# Patient Record
Sex: Female | Born: 1955 | Race: White | Hispanic: No | Marital: Single | Smoking: Never smoker
Health system: Southern US, Community
[De-identification: ages and names within clinical notes are randomized; demographics above are authoritative.]

## PROBLEM LIST (undated history)

## (undated) DIAGNOSIS — D689 Coagulation defect, unspecified: Secondary | ICD-10-CM

## (undated) DIAGNOSIS — R112 Nausea with vomiting, unspecified: Secondary | ICD-10-CM

## (undated) DIAGNOSIS — N200 Calculus of kidney: Secondary | ICD-10-CM

## (undated) DIAGNOSIS — I10 Essential (primary) hypertension: Secondary | ICD-10-CM

## (undated) DIAGNOSIS — Z86718 Personal history of other venous thrombosis and embolism: Secondary | ICD-10-CM

## (undated) DIAGNOSIS — T4145XA Adverse effect of unspecified anesthetic, initial encounter: Secondary | ICD-10-CM

## (undated) DIAGNOSIS — M1990 Unspecified osteoarthritis, unspecified site: Secondary | ICD-10-CM

## (undated) DIAGNOSIS — K5790 Diverticulosis of intestine, part unspecified, without perforation or abscess without bleeding: Secondary | ICD-10-CM

## (undated) DIAGNOSIS — E785 Hyperlipidemia, unspecified: Secondary | ICD-10-CM

## (undated) DIAGNOSIS — Z87442 Personal history of urinary calculi: Secondary | ICD-10-CM

## (undated) DIAGNOSIS — R7303 Prediabetes: Secondary | ICD-10-CM

## (undated) DIAGNOSIS — T7840XA Allergy, unspecified, initial encounter: Secondary | ICD-10-CM

## (undated) DIAGNOSIS — Z9889 Other specified postprocedural states: Secondary | ICD-10-CM

## (undated) HISTORY — PX: JOINT REPLACEMENT: SHX530

## (undated) HISTORY — PX: COLONOSCOPY: SHX174

## (undated) HISTORY — DX: Coagulation defect, unspecified: D68.9

## (undated) HISTORY — DX: Hyperlipidemia, unspecified: E78.5

## (undated) HISTORY — DX: Allergy, unspecified, initial encounter: T78.40XA

## (undated) HISTORY — DX: Calculus of kidney: N20.0

## (undated) HISTORY — DX: Diverticulosis of intestine, part unspecified, without perforation or abscess without bleeding: K57.90

## (undated) HISTORY — DX: Unspecified osteoarthritis, unspecified site: M19.90

## (undated) HISTORY — DX: Essential (primary) hypertension: I10

## (undated) HISTORY — PX: ROTATOR CUFF REPAIR: SHX139

---
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
## 1994-11-14 DIAGNOSIS — Z86718 Personal history of other venous thrombosis and embolism: Secondary | ICD-10-CM

## 1994-11-14 HISTORY — DX: Personal history of other venous thrombosis and embolism: Z86.718

## 2001-05-15 ENCOUNTER — Ambulatory Visit (HOSPITAL_COMMUNITY): Admission: RE | Admit: 2001-05-15 | Discharge: 2001-05-15 | Payer: Self-pay | Admitting: Family Medicine

## 2003-12-17 ENCOUNTER — Emergency Department (HOSPITAL_COMMUNITY): Admission: EM | Admit: 2003-12-17 | Discharge: 2003-12-17 | Payer: Self-pay | Admitting: Family Medicine

## 2004-01-16 ENCOUNTER — Encounter: Admission: RE | Admit: 2004-01-16 | Discharge: 2004-01-16 | Payer: Self-pay | Admitting: Family Medicine

## 2004-02-03 ENCOUNTER — Encounter: Admission: RE | Admit: 2004-02-03 | Discharge: 2004-02-03 | Payer: Self-pay | Admitting: Sports Medicine

## 2004-03-08 ENCOUNTER — Encounter: Admission: RE | Admit: 2004-03-08 | Discharge: 2004-03-08 | Payer: Self-pay | Admitting: Family Medicine

## 2004-04-05 ENCOUNTER — Encounter: Admission: RE | Admit: 2004-04-05 | Discharge: 2004-04-05 | Payer: Self-pay | Admitting: Family Medicine

## 2004-05-24 ENCOUNTER — Encounter: Admission: RE | Admit: 2004-05-24 | Discharge: 2004-05-24 | Payer: Self-pay | Admitting: Sports Medicine

## 2004-06-21 ENCOUNTER — Encounter: Admission: RE | Admit: 2004-06-21 | Discharge: 2004-06-21 | Payer: Self-pay | Admitting: Family Medicine

## 2004-07-26 ENCOUNTER — Ambulatory Visit: Payer: Self-pay | Admitting: Family Medicine

## 2004-08-09 ENCOUNTER — Ambulatory Visit: Payer: Self-pay | Admitting: Family Medicine

## 2004-09-13 ENCOUNTER — Ambulatory Visit: Payer: Self-pay | Admitting: Sports Medicine

## 2004-10-25 ENCOUNTER — Ambulatory Visit: Payer: Self-pay | Admitting: Family Medicine

## 2004-11-22 ENCOUNTER — Ambulatory Visit: Payer: Self-pay | Admitting: Family Medicine

## 2004-11-29 ENCOUNTER — Ambulatory Visit: Payer: Self-pay | Admitting: Family Medicine

## 2004-12-06 ENCOUNTER — Ambulatory Visit: Payer: Self-pay | Admitting: Family Medicine

## 2004-12-20 ENCOUNTER — Ambulatory Visit: Payer: Self-pay | Admitting: Family Medicine

## 2005-01-03 ENCOUNTER — Ambulatory Visit: Payer: Self-pay | Admitting: Family Medicine

## 2005-01-17 ENCOUNTER — Ambulatory Visit: Payer: Self-pay | Admitting: Sports Medicine

## 2005-02-28 ENCOUNTER — Ambulatory Visit: Payer: Self-pay | Admitting: Sports Medicine

## 2005-03-14 ENCOUNTER — Ambulatory Visit: Payer: Self-pay | Admitting: Sports Medicine

## 2005-03-28 ENCOUNTER — Ambulatory Visit: Payer: Self-pay | Admitting: Family Medicine

## 2005-04-18 ENCOUNTER — Ambulatory Visit: Payer: Self-pay | Admitting: Family Medicine

## 2005-05-02 ENCOUNTER — Ambulatory Visit: Payer: Self-pay | Admitting: Family Medicine

## 2005-05-16 ENCOUNTER — Ambulatory Visit: Payer: Self-pay | Admitting: Family Medicine

## 2005-05-30 ENCOUNTER — Ambulatory Visit: Payer: Self-pay | Admitting: Sports Medicine

## 2005-06-13 ENCOUNTER — Ambulatory Visit: Payer: Self-pay | Admitting: Family Medicine

## 2005-08-01 ENCOUNTER — Ambulatory Visit: Payer: Self-pay | Admitting: Sports Medicine

## 2005-08-22 ENCOUNTER — Ambulatory Visit: Payer: Self-pay | Admitting: Family Medicine

## 2005-08-29 ENCOUNTER — Ambulatory Visit: Payer: Self-pay | Admitting: Family Medicine

## 2005-09-12 ENCOUNTER — Ambulatory Visit: Payer: Self-pay | Admitting: Sports Medicine

## 2005-09-26 ENCOUNTER — Encounter: Admission: RE | Admit: 2005-09-26 | Discharge: 2005-09-26 | Payer: Self-pay | Admitting: Sports Medicine

## 2005-10-10 ENCOUNTER — Ambulatory Visit: Payer: Self-pay | Admitting: Family Medicine

## 2005-11-09 ENCOUNTER — Ambulatory Visit: Payer: Self-pay | Admitting: Family Medicine

## 2005-12-12 ENCOUNTER — Ambulatory Visit: Payer: Self-pay | Admitting: Sports Medicine

## 2006-01-09 ENCOUNTER — Ambulatory Visit: Payer: Self-pay | Admitting: Family Medicine

## 2006-01-23 ENCOUNTER — Ambulatory Visit: Payer: Self-pay | Admitting: Family Medicine

## 2006-02-06 ENCOUNTER — Ambulatory Visit: Payer: Self-pay | Admitting: Family Medicine

## 2006-02-20 ENCOUNTER — Ambulatory Visit: Payer: Self-pay | Admitting: Family Medicine

## 2006-03-06 ENCOUNTER — Ambulatory Visit: Payer: Self-pay | Admitting: Family Medicine

## 2006-03-27 ENCOUNTER — Ambulatory Visit: Payer: Self-pay | Admitting: Sports Medicine

## 2006-04-17 ENCOUNTER — Ambulatory Visit: Payer: Self-pay | Admitting: Family Medicine

## 2006-05-09 ENCOUNTER — Ambulatory Visit (HOSPITAL_BASED_OUTPATIENT_CLINIC_OR_DEPARTMENT_OTHER): Admission: RE | Admit: 2006-05-09 | Discharge: 2006-05-09 | Payer: Self-pay | Admitting: Orthopaedic Surgery

## 2006-05-15 ENCOUNTER — Ambulatory Visit (HOSPITAL_COMMUNITY): Admission: RE | Admit: 2006-05-15 | Discharge: 2006-05-15 | Payer: Self-pay | Admitting: Family Medicine

## 2006-05-15 ENCOUNTER — Ambulatory Visit: Payer: Self-pay | Admitting: Family Medicine

## 2006-05-15 ENCOUNTER — Encounter: Payer: Self-pay | Admitting: Vascular Surgery

## 2006-06-01 ENCOUNTER — Ambulatory Visit: Payer: Self-pay | Admitting: Family Medicine

## 2006-06-09 ENCOUNTER — Ambulatory Visit: Payer: Self-pay | Admitting: Family Medicine

## 2006-06-26 ENCOUNTER — Ambulatory Visit: Payer: Self-pay | Admitting: Family Medicine

## 2006-07-10 ENCOUNTER — Ambulatory Visit: Payer: Self-pay | Admitting: Family Medicine

## 2006-07-31 ENCOUNTER — Ambulatory Visit: Payer: Self-pay | Admitting: Sports Medicine

## 2006-09-04 ENCOUNTER — Ambulatory Visit: Payer: Self-pay | Admitting: Sports Medicine

## 2006-10-09 ENCOUNTER — Ambulatory Visit: Payer: Self-pay | Admitting: Sports Medicine

## 2006-10-23 ENCOUNTER — Ambulatory Visit: Payer: Self-pay | Admitting: Family Medicine

## 2006-11-08 ENCOUNTER — Ambulatory Visit: Payer: Self-pay | Admitting: Family Medicine

## 2006-11-27 ENCOUNTER — Ambulatory Visit: Payer: Self-pay | Admitting: Sports Medicine

## 2006-12-18 ENCOUNTER — Ambulatory Visit: Payer: Self-pay | Admitting: Family Medicine

## 2007-01-01 ENCOUNTER — Ambulatory Visit: Payer: Self-pay | Admitting: Family Medicine

## 2007-01-16 ENCOUNTER — Ambulatory Visit: Payer: Self-pay | Admitting: Family Medicine

## 2007-01-16 DIAGNOSIS — I82409 Acute embolism and thrombosis of unspecified deep veins of unspecified lower extremity: Secondary | ICD-10-CM | POA: Insufficient documentation

## 2007-01-16 LAB — CONVERTED CEMR LAB
INR: 3
Prothrombin Time: 36 s

## 2007-01-29 ENCOUNTER — Ambulatory Visit: Payer: Self-pay | Admitting: Family Medicine

## 2007-01-29 LAB — CONVERTED CEMR LAB
INR: 2.1
Prothrombin Time: 25.5 s

## 2007-02-12 ENCOUNTER — Ambulatory Visit: Payer: Self-pay | Admitting: Family Medicine

## 2007-02-12 LAB — CONVERTED CEMR LAB: INR: 2.4

## 2007-03-12 ENCOUNTER — Ambulatory Visit: Payer: Self-pay | Admitting: Family Medicine

## 2007-03-12 LAB — CONVERTED CEMR LAB: INR: 2.4

## 2007-04-16 ENCOUNTER — Ambulatory Visit: Payer: Self-pay | Admitting: Family Medicine

## 2007-04-16 LAB — CONVERTED CEMR LAB: INR: 1.2

## 2007-04-23 ENCOUNTER — Ambulatory Visit: Payer: Self-pay | Admitting: Sports Medicine

## 2007-04-23 LAB — CONVERTED CEMR LAB: INR: 1.7

## 2007-05-07 ENCOUNTER — Ambulatory Visit: Payer: Self-pay | Admitting: Sports Medicine

## 2007-05-07 LAB — CONVERTED CEMR LAB: INR: 2.3

## 2007-05-21 ENCOUNTER — Ambulatory Visit: Payer: Self-pay | Admitting: Family Medicine

## 2007-05-21 LAB — CONVERTED CEMR LAB: INR: 1.8

## 2007-06-04 ENCOUNTER — Ambulatory Visit: Payer: Self-pay | Admitting: Family Medicine

## 2007-06-04 LAB — CONVERTED CEMR LAB: INR: 2.5

## 2007-06-18 ENCOUNTER — Ambulatory Visit: Payer: Self-pay | Admitting: Sports Medicine

## 2007-06-18 LAB — CONVERTED CEMR LAB: INR: 2.7

## 2007-07-17 ENCOUNTER — Ambulatory Visit: Payer: Self-pay | Admitting: Internal Medicine

## 2007-07-17 LAB — CONVERTED CEMR LAB: INR: 2.1

## 2007-08-13 ENCOUNTER — Ambulatory Visit: Payer: Self-pay | Admitting: Family Medicine

## 2007-08-13 LAB — CONVERTED CEMR LAB: INR: 2.4

## 2007-09-10 ENCOUNTER — Ambulatory Visit: Payer: Self-pay | Admitting: Sports Medicine

## 2007-09-10 LAB — CONVERTED CEMR LAB: INR: 2.6

## 2007-10-15 ENCOUNTER — Ambulatory Visit: Payer: Self-pay | Admitting: Family Medicine

## 2007-10-16 LAB — CONVERTED CEMR LAB: INR: 2.4

## 2007-10-22 ENCOUNTER — Ambulatory Visit: Payer: Self-pay | Admitting: Family Medicine

## 2007-11-12 ENCOUNTER — Ambulatory Visit: Payer: Self-pay | Admitting: Sports Medicine

## 2007-11-12 LAB — CONVERTED CEMR LAB: INR: 2.9

## 2007-12-10 ENCOUNTER — Ambulatory Visit: Payer: Self-pay | Admitting: Family Medicine

## 2007-12-10 LAB — CONVERTED CEMR LAB: INR: 2.6

## 2008-01-07 ENCOUNTER — Ambulatory Visit: Payer: Self-pay | Admitting: Sports Medicine

## 2008-01-07 ENCOUNTER — Encounter (INDEPENDENT_AMBULATORY_CARE_PROVIDER_SITE_OTHER): Payer: Self-pay | Admitting: Family Medicine

## 2008-01-07 LAB — CONVERTED CEMR LAB: INR: 2.2

## 2008-02-07 ENCOUNTER — Ambulatory Visit: Payer: Self-pay | Admitting: Family Medicine

## 2008-02-07 DIAGNOSIS — E669 Obesity, unspecified: Secondary | ICD-10-CM | POA: Insufficient documentation

## 2008-02-07 DIAGNOSIS — Z8672 Personal history of thrombophlebitis: Secondary | ICD-10-CM | POA: Insufficient documentation

## 2008-02-07 LAB — CONVERTED CEMR LAB: INR: 2.6

## 2008-03-03 ENCOUNTER — Ambulatory Visit: Payer: Self-pay | Admitting: Sports Medicine

## 2008-03-03 LAB — CONVERTED CEMR LAB: INR: 1.8

## 2008-03-31 ENCOUNTER — Encounter (INDEPENDENT_AMBULATORY_CARE_PROVIDER_SITE_OTHER): Payer: Self-pay | Admitting: Family Medicine

## 2008-04-08 ENCOUNTER — Ambulatory Visit: Payer: Self-pay | Admitting: Family Medicine

## 2008-04-08 LAB — CONVERTED CEMR LAB: INR: 2.9

## 2008-05-05 ENCOUNTER — Encounter (INDEPENDENT_AMBULATORY_CARE_PROVIDER_SITE_OTHER): Payer: Self-pay | Admitting: Family Medicine

## 2008-05-12 ENCOUNTER — Ambulatory Visit: Payer: Self-pay | Admitting: Family Medicine

## 2008-05-12 LAB — CONVERTED CEMR LAB: INR: 2.2

## 2008-05-14 LAB — CONVERTED CEMR LAB: Pap Smear: NORMAL

## 2008-06-09 ENCOUNTER — Encounter (INDEPENDENT_AMBULATORY_CARE_PROVIDER_SITE_OTHER): Payer: Self-pay | Admitting: Family Medicine

## 2008-06-16 ENCOUNTER — Ambulatory Visit: Payer: Self-pay | Admitting: Sports Medicine

## 2008-06-16 LAB — CONVERTED CEMR LAB: INR: 2.3

## 2008-07-14 ENCOUNTER — Ambulatory Visit: Payer: Self-pay | Admitting: Family Medicine

## 2008-07-14 LAB — CONVERTED CEMR LAB: INR: 1.6

## 2008-07-28 ENCOUNTER — Ambulatory Visit: Payer: Self-pay | Admitting: Family Medicine

## 2008-07-28 LAB — CONVERTED CEMR LAB: INR: 1.9

## 2008-08-11 ENCOUNTER — Ambulatory Visit: Payer: Self-pay | Admitting: Family Medicine

## 2008-08-11 LAB — CONVERTED CEMR LAB: INR: 2.2

## 2008-09-10 ENCOUNTER — Ambulatory Visit: Payer: Self-pay | Admitting: Family Medicine

## 2008-09-10 ENCOUNTER — Encounter (INDEPENDENT_AMBULATORY_CARE_PROVIDER_SITE_OTHER): Payer: Self-pay | Admitting: Family Medicine

## 2008-09-10 LAB — CONVERTED CEMR LAB: INR: 2.5

## 2008-09-11 ENCOUNTER — Encounter (INDEPENDENT_AMBULATORY_CARE_PROVIDER_SITE_OTHER): Payer: Self-pay | Admitting: Family Medicine

## 2008-09-11 LAB — CONVERTED CEMR LAB
ALT: 16 units/L (ref 0–35)
AST: 16 units/L (ref 0–37)
Albumin: 4.1 g/dL (ref 3.5–5.2)
Alkaline Phosphatase: 109 units/L (ref 39–117)
BUN: 18 mg/dL (ref 6–23)
CO2: 24 meq/L (ref 19–32)
Calcium: 10.1 mg/dL (ref 8.4–10.5)
Chloride: 105 meq/L (ref 96–112)
Cholesterol: 207 mg/dL — ABNORMAL HIGH (ref 0–200)
Creatinine, Ser: 0.66 mg/dL (ref 0.40–1.20)
Glucose, Bld: 104 mg/dL — ABNORMAL HIGH (ref 70–99)
HDL: 44 mg/dL (ref >39)
LDL Cholesterol: 134 mg/dL — ABNORMAL HIGH (ref 0–99)
Potassium: 4.2 meq/L (ref 3.5–5.3)
Sodium: 139 meq/L (ref 135–145)
Total Bilirubin: 0.4 mg/dL (ref 0.3–1.2)
Total CHOL/HDL Ratio: 4.7
Total Protein: 6.9 g/dL (ref 6.0–8.3)
Triglycerides: 144 mg/dL (ref <150)
VLDL: 29 mg/dL (ref 0–40)

## 2008-10-06 ENCOUNTER — Encounter (INDEPENDENT_AMBULATORY_CARE_PROVIDER_SITE_OTHER): Payer: Self-pay | Admitting: Family Medicine

## 2008-10-13 ENCOUNTER — Ambulatory Visit: Payer: Self-pay | Admitting: Family Medicine

## 2008-10-13 LAB — CONVERTED CEMR LAB: INR: 5

## 2008-10-20 ENCOUNTER — Ambulatory Visit: Payer: Self-pay | Admitting: Family Medicine

## 2008-10-20 LAB — CONVERTED CEMR LAB: INR: 2.4

## 2008-10-21 ENCOUNTER — Emergency Department (HOSPITAL_COMMUNITY): Admission: EM | Admit: 2008-10-21 | Discharge: 2008-10-21 | Payer: Self-pay | Admitting: Emergency Medicine

## 2008-11-03 ENCOUNTER — Ambulatory Visit: Payer: Self-pay | Admitting: Family Medicine

## 2008-11-03 LAB — CONVERTED CEMR LAB: INR: 3.9

## 2008-11-17 ENCOUNTER — Ambulatory Visit: Payer: Self-pay | Admitting: Family Medicine

## 2008-11-17 LAB — CONVERTED CEMR LAB: INR: 2.9

## 2008-11-18 ENCOUNTER — Encounter (INDEPENDENT_AMBULATORY_CARE_PROVIDER_SITE_OTHER): Payer: Self-pay | Admitting: Family Medicine

## 2008-12-03 ENCOUNTER — Ambulatory Visit: Payer: Self-pay | Admitting: Family Medicine

## 2008-12-03 LAB — CONVERTED CEMR LAB: INR: 2.2

## 2008-12-18 ENCOUNTER — Telehealth (INDEPENDENT_AMBULATORY_CARE_PROVIDER_SITE_OTHER): Payer: Self-pay | Admitting: *Deleted

## 2008-12-29 ENCOUNTER — Ambulatory Visit: Payer: Self-pay | Admitting: Family Medicine

## 2008-12-29 LAB — CONVERTED CEMR LAB: INR: 1.9

## 2009-01-26 ENCOUNTER — Ambulatory Visit: Payer: Self-pay | Admitting: Family Medicine

## 2009-01-26 LAB — CONVERTED CEMR LAB: INR: 2.4

## 2009-02-23 ENCOUNTER — Ambulatory Visit: Payer: Self-pay | Admitting: Family Medicine

## 2009-02-23 LAB — CONVERTED CEMR LAB: INR: 3.1

## 2009-03-09 ENCOUNTER — Ambulatory Visit: Payer: Self-pay | Admitting: Family Medicine

## 2009-03-09 LAB — CONVERTED CEMR LAB: INR: 3.2

## 2009-03-23 ENCOUNTER — Ambulatory Visit: Payer: Self-pay | Admitting: Family Medicine

## 2009-03-23 LAB — CONVERTED CEMR LAB: INR: 1.3

## 2009-04-06 ENCOUNTER — Ambulatory Visit: Payer: Self-pay | Admitting: Family Medicine

## 2009-04-06 LAB — CONVERTED CEMR LAB: INR: 3.3

## 2009-04-20 ENCOUNTER — Ambulatory Visit: Payer: Self-pay | Admitting: Family Medicine

## 2009-04-20 LAB — CONVERTED CEMR LAB: INR: 2.6

## 2009-05-11 ENCOUNTER — Ambulatory Visit: Payer: Self-pay | Admitting: Family Medicine

## 2009-05-11 LAB — CONVERTED CEMR LAB: INR: 2.2

## 2009-06-08 ENCOUNTER — Ambulatory Visit: Payer: Self-pay | Admitting: Family Medicine

## 2009-06-08 LAB — CONVERTED CEMR LAB: INR: 2

## 2009-07-06 ENCOUNTER — Ambulatory Visit: Payer: Self-pay | Admitting: Family Medicine

## 2009-07-06 LAB — CONVERTED CEMR LAB: INR: 2.5

## 2009-08-03 ENCOUNTER — Ambulatory Visit: Payer: Self-pay | Admitting: Family Medicine

## 2009-08-03 LAB — CONVERTED CEMR LAB: INR: 2.1

## 2009-08-31 ENCOUNTER — Ambulatory Visit: Payer: Self-pay | Admitting: Family Medicine

## 2009-08-31 LAB — CONVERTED CEMR LAB: INR: 2.1

## 2009-09-28 ENCOUNTER — Ambulatory Visit: Payer: Self-pay | Admitting: Family Medicine

## 2009-09-28 DIAGNOSIS — D239 Other benign neoplasm of skin, unspecified: Secondary | ICD-10-CM | POA: Insufficient documentation

## 2009-09-28 LAB — CONVERTED CEMR LAB: INR: 2

## 2009-10-26 ENCOUNTER — Ambulatory Visit: Payer: Self-pay | Admitting: Family Medicine

## 2009-11-16 ENCOUNTER — Ambulatory Visit: Payer: Self-pay | Admitting: Family Medicine

## 2009-12-14 ENCOUNTER — Ambulatory Visit: Payer: Self-pay | Admitting: Family Medicine

## 2009-12-14 LAB — CONVERTED CEMR LAB: INR: 2.5

## 2010-01-11 ENCOUNTER — Ambulatory Visit: Payer: Self-pay | Admitting: Family Medicine

## 2010-02-08 ENCOUNTER — Ambulatory Visit: Payer: Self-pay | Admitting: Family Medicine

## 2010-02-08 ENCOUNTER — Encounter: Payer: Self-pay | Admitting: Family Medicine

## 2010-02-10 ENCOUNTER — Encounter: Payer: Self-pay | Admitting: Family Medicine

## 2010-02-11 ENCOUNTER — Telehealth: Payer: Self-pay | Admitting: Family Medicine

## 2010-02-11 ENCOUNTER — Ambulatory Visit: Payer: Self-pay | Admitting: Family Medicine

## 2010-02-15 ENCOUNTER — Ambulatory Visit: Payer: Self-pay | Admitting: Family Medicine

## 2010-03-15 ENCOUNTER — Ambulatory Visit: Payer: Self-pay | Admitting: Family Medicine

## 2010-04-20 ENCOUNTER — Ambulatory Visit: Payer: Self-pay | Admitting: Family Medicine

## 2010-04-20 LAB — CONVERTED CEMR LAB: INR: 1.9

## 2010-05-03 ENCOUNTER — Ambulatory Visit: Payer: Self-pay | Admitting: Family Medicine

## 2010-05-03 DIAGNOSIS — I10 Essential (primary) hypertension: Secondary | ICD-10-CM | POA: Insufficient documentation

## 2010-05-10 ENCOUNTER — Ambulatory Visit: Payer: Self-pay | Admitting: Family Medicine

## 2010-05-10 ENCOUNTER — Encounter: Payer: Self-pay | Admitting: Family Medicine

## 2010-05-10 LAB — CONVERTED CEMR LAB
CO2: 28 meq/L (ref 19–32)
Chloride: 103 meq/L (ref 96–112)
Creatinine, Ser: 0.69 mg/dL (ref 0.40–1.20)
Potassium: 3.9 meq/L (ref 3.5–5.3)
Sodium: 140 meq/L (ref 135–145)

## 2010-06-07 ENCOUNTER — Ambulatory Visit: Payer: Self-pay | Admitting: Family Medicine

## 2010-07-06 ENCOUNTER — Ambulatory Visit: Payer: Self-pay | Admitting: Family Medicine

## 2010-07-06 DIAGNOSIS — B354 Tinea corporis: Secondary | ICD-10-CM | POA: Insufficient documentation

## 2010-07-06 DIAGNOSIS — F39 Unspecified mood [affective] disorder: Secondary | ICD-10-CM | POA: Insufficient documentation

## 2010-07-06 DIAGNOSIS — M722 Plantar fascial fibromatosis: Secondary | ICD-10-CM | POA: Insufficient documentation

## 2010-07-06 LAB — CONVERTED CEMR LAB: INR: 3.1

## 2010-07-12 ENCOUNTER — Ambulatory Visit: Payer: Self-pay | Admitting: Family Medicine

## 2010-07-20 ENCOUNTER — Ambulatory Visit: Payer: Self-pay | Admitting: Family Medicine

## 2010-07-26 ENCOUNTER — Ambulatory Visit: Payer: Self-pay | Admitting: Family Medicine

## 2010-08-16 ENCOUNTER — Ambulatory Visit: Payer: Self-pay | Admitting: Family Medicine

## 2010-09-13 ENCOUNTER — Ambulatory Visit: Payer: Self-pay | Admitting: Family Medicine

## 2010-10-11 ENCOUNTER — Ambulatory Visit: Payer: Self-pay | Admitting: Family Medicine

## 2010-11-09 ENCOUNTER — Ambulatory Visit: Payer: Self-pay | Admitting: Family Medicine

## 2010-11-14 HISTORY — PX: TOTAL KNEE ARTHROPLASTY: SHX125

## 2010-12-06 ENCOUNTER — Ambulatory Visit: Admission: RE | Admit: 2010-12-06 | Discharge: 2010-12-06 | Payer: Self-pay | Source: Home / Self Care

## 2010-12-06 LAB — CONVERTED CEMR LAB: INR: 2

## 2010-12-14 NOTE — Assessment & Plan Note (Signed)
Summary: f/u last visit/eo   Vital Signs:  Patient profile:   55 year old female Height:      65 inches Weight:      232.3 pounds BMI:     38.80 Temp:     97.8 degrees F oral Pulse rate:   60 / minute BP sitting:   148 / 82  (left arm) Cuff size:   regular  Vitals Entered By: Gladstone Pih (May 10, 2010 9:46 AM) CC: F/U HTN Is Patient Diabetic? No Pain Assessment Patient in pain? no        Primary Care Provider:  Ellery Plunk MD  CC:  F/U HTN.  History of Present Illness: BP- walking most days.  still high stress at work.  no change in diet.  has some vacation coming up in July.    nose bleeds- no more since last visit.  anxious that they might come back.  Habits & Providers  Alcohol-Tobacco-Diet     Tobacco Status: never  Current Medications (verified): 1)  Coumadin 5 Mg Tabs (Warfarin Sodium) .... Take By Mouth As Directed 2)  Hydrochlorothiazide 25 Mg Tabs (Hydrochlorothiazide) .... Take One Daily  Allergies (verified): 1)  Keflex (Cephalexin)  Social History: Single.  No etoh, no tob, no drug.  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    Review of Systems  The patient denies anorexia, fever, chest pain, prolonged cough, and abdominal pain.    Physical Exam  General:  Well-developed,well-nourished,in no acute distress; alert,appropriate and cooperative throughout examination Extremities:  no edema bilaterally   Impression & Recommendations:  Problem # 1:  ESSENTIAL HYPERTENSION, BENIGN (ICD-401.1) Assessment Improved pt is improving walking regimen.  no change in diet.  no interest in changing diet.  seems pessimistic about work, but believes she can change health for better.  Stress management? in future. Her updated medication list for this problem includes:    Hydrochlorothiazide 25 Mg Tabs (Hydrochlorothiazide) .Marland Kitchen... Take one daily  Orders: [facility]- Est Level  3 (04540)  Complete Medication List: 1)  Coumadin 5  Mg Tabs (Warfarin sodium) .... Take by mouth as directed 2)  Hydrochlorothiazide 25 Mg Tabs (Hydrochlorothiazide) .... Take one daily  Patient Instructions: 1)  Please make an appt for 2 months from now for blood pressure follow up 2)  I will call if your labs look abnormal, otherwise I will see you in 2 months.   3)  Keep up the good work with walking.  Make sure you are drinking lots of water in this summer heat    Appended Document: INR=2.5   ANTICOAGULATION RECORD PREVIOUS REGIMEN & LAB RESULTS Anticoagulation Diagnosis:  Deep venous thrombosis on  02/12/2007 Previous INR Goal Range:  2-3 on  02/12/2007 Previous INR:  1.9 on  04/20/2010 Previous Coumadin Dose(mg):  5 mg tablet on  02/12/2007 Previous Regimen:  continue 5mg  M,W,F; 7.5mg  other days on  04/20/2010 Previous Coagulation Comments:  patient has been eating an increased number of salads  on  12/29/2008  NEW REGIMEN & LAB RESULTS Current INR: 2.5 Regimen: continue 5mg  M,W,F; 7.5mg  other days  Provider: Dr. Hulen Luster Repeat testing in: 4 weeks Other Comments: ...........test performed by...........Marland KitchenTerese Door, CMA   Dose has been reviewed with patient or caretaker during this visit. Reviewed by: D. Kathrine Cords, CMA  Anticoagulation Visit Questionnaire Coumadin dose missed/changed:  No Abnormal Bleeding Symptoms:  Yes    Bruising or bleeding from nose or gums, in urine or stool since the last  visit:  Nose bleeds-see office visit notes. Any diet changes including alcohol intake, vegetables or greens since the last visit:  No Any illnesses or hospitalizations since the last visit:  No Any signs of clotting since the last visit (including chest discomfort, dizziness, shortness of breath, arm tingling, slurred speech, swelling or redness in leg):  No  MEDICATIONS COUMADIN 5 MG TABS (WARFARIN SODIUM) Take by mouth as directed HYDROCHLOROTHIAZIDE 25 MG TABS (HYDROCHLOROTHIAZIDE) take one daily

## 2010-12-14 NOTE — Assessment & Plan Note (Signed)
Summary: spot on arm,tcb   Vital Signs:  Patient profile:   55 year old female Height:      65 inches Weight:      237.3 pounds BMI:     39.63 Temp:     98.3 degrees F oral Pulse rate:   61 / minute BP sitting:   171 / 92  (left arm) Cuff size:   regular  Vitals Entered By: Garen Grams LPN (February 08, 2010 11:20 AM) CC: dark spot on right arm getting larger Is Patient Diabetic? No Pain Assessment Patient in pain? no        Primary Care Provider:  Ellery Plunk MD  CC:  dark spot on right arm getting larger.  History of Present Illness: RIGHT forearm extensor surface with 3 mm dark nevus.  Has been there since childhood, but is now getting larger over the past year.  No pruritus or bleeding.  History of frequent sunburns earlier in life.  No history of melanoma or non-melanoma skin cancer.  Also with itching lesion on back.  Habits & Providers  Alcohol-Tobacco-Diet     Tobacco Status: never  Procedure Note  Mole Biopsy/Removal: The patient complains of changing mole. Indication: changing lesion  Procedure # 1: elliptical incision with 2 mm margin    Size (in cm): 0.3 x 0.3    Region: dorsal    Location: arm-lower-right    Instrument used: #15 blade    Anesthesia: 2.0 ml 1% lidocaine w/epinephrine    Closure: simple interrupted    Superficial Suture: 5-0 prolene       # of superficial sutures: 3  Cleaned and prepped with: alcohol and betadine Wound dressing: bulky gauze dressing Instructions: daily dressing changes and RTC in 7-10 days  Procedure Note  Mole Biopsy/Removal: The patient complains of changing mole. Indication: changing lesion  Procedure # 1: elliptical incision with 2 mm margin    Size (in cm): 0.3 x 0.3    Region: dorsal    Location: arm-lower-right    Instrument used: #15 blade    Anesthesia: 2.0 ml 1% lidocaine w/epinephrine    Closure: simple interrupted    Superficial Suture: 5-0 prolene       # of superficial sutures:  3  Cleaned and prepped with: alcohol and betadine Wound dressing: bulky gauze dressing Instructions: daily dressing changes and RTC in 7-10 days  Current Medications (verified): 1)  Coumadin 5 Mg Tabs (Warfarin Sodium) .... Take By Mouth As Directed  Allergies (verified): 1)  Keflex (Cephalexin)  Physical Exam  General:  Well-developed,well-nourished,in no acute distress; alert,appropriate and cooperative throughout examination Skin:  3 mm black flat nevus RIGHT forearm, extensor surface.  Not multicolored, regular border, asymmetric.  1.5 cm scaly stuck on lesion on lower back.   Impression & Recommendations:  Problem # 1:  NEVUS, ATYPICAL (ICD-216.9) Assessment New Wide excision today.  Entire lesion removed with wide border lateral and deep.  Pathology sent. Orders: Provider Misc Charge- [facility] (Misc)  Problem # 2:  SEBORRHEIC KERATOSIS (ICD-702.19) Assessment: New Cryo and scraped.  Bandaid.  Complete Medication List: 1)  Coumadin 5 Mg Tabs (Warfarin sodium) .... Take by mouth as directed  Patient Instructions: 1)  Pleasure to meet you today. 2)  Please schedule a follow-up appointment in NURSE CLINIC 1 week for suture removal.  Appended Document: spot on arm,tcb     Allergies: 1)  Keflex (Cephalexin)   Complete Medication List: 1)  Coumadin 5 Mg Tabs (Warfarin sodium) .... Take  by mouth as directed 2)  Doxycycline Hyclate 100 Mg Caps (Doxycycline hyclate) .... One tab by mouth twice a day for a week  Other Orders: Excise lesion (TAL) 0 - 0.5 cm (11400) Shave Skin Lesion 1.1-2.0 cm/trunk/arm/leg (81191)

## 2010-12-14 NOTE — Progress Notes (Signed)
Summary: triage  Phone Note Call from Patient Call back at Home Phone [phone]   Caller: Patient Summary of Call: Asking to speak to a nurse. Initial call taken by: Clydell Hakim,  February 11, 2010 9:05 AM  Follow-up for Phone Call        on MOnday had spot removed from arm & had 3 stitches. it is pink around the area. TTT. slightly warm to touch. wants it checked. appt now in work in Follow-up by: Golden Circle RN,  February 11, 2010 9:11 AM

## 2010-12-14 NOTE — Assessment & Plan Note (Signed)
Summary: orthotics,mc   Vital Signs:  Patient profile:   55 year old female Pulse rate:   61 / minute BP sitting:   148 / 74  (left arm)  Vitals Entered By: Terese Door (July 26, 2010 3:01 PM) CC: Orthotics   Primary Care Provider:  Ellery Plunk MD  CC:  Orthotics.  History of Present Illness: f/u B plantar fascia pain. Unchanged (see previous note) Has upcoming busy season at work (works 12 hr shifts at [employer])--on her feet most of tat time. Brings 3 pr of her shoes with her today  Current Medications (verified): 1)  Coumadin 5 Mg Tabs (Warfarin Sodium) .... Take By Mouth As Directed 2)  Hydrochlorothiazide 25 Mg Tabs (Hydrochlorothiazide) .... Take One Daily 3)  Celexa 20 Mg Tabs (Citalopram Hydrobromide) .... Take One Daily  Allergies: 1)  Keflex (Cephalexin)  Review of Systems  The patient denies fever, weight loss, and vision loss.    Physical Exam  General:  alert, well-developed, well-nourished, well-hydrated, and overweight-appearing.   Msk:  B feet have significant 1st MTP bunions with deviation first ray and phalanges. Pes planus B. TTP on plantar surface diffusely.  Transverse arch is flat as well as longitudinal arch. NEURO: normal sensation B feet SKIN: mild callous latera; border foot and under first MTP/  GAIT: antalgic Additional Exam:  Patient was fitted for a : standard, cushioned, semi-rigid orthotic. The orthotic was heated and afterward the patient stood on the orthotic blank positioned on the orthotic stand. The patient was positioned in subtalar neutral position and 10 degrees of ankle dorsiflexion in a weight bearing stance. After completion of molding, a stable base was applied to the orthotic blank. The blank was ground to a stable position for weight bearing. Bleu l;eather blank Base:white base Posting: B medial heel and 1st ray.    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 rtc as needed she was VERY excited about how comfortable her new orthotics made her feet feel (tearful)  Orders: [employer], each unit ([id number])  Complete Medication List: 1)  Coumadin 5 Mg Tabs (Warfarin sodium) .... Take by mouth as directed 2)  Hydrochlorothiazide 25 Mg Tabs (Hydrochlorothiazide) .... Take one daily 3)  Celexa 20 Mg Tabs (Citalopram hydrobromide) .... Take one daily

## 2010-12-14 NOTE — Miscellaneous (Signed)
Summary: Procedure Consent  Procedure Consent   Imported By: Bradly Bienenstock 02/18/2010 12:02:53  _____________________________________________________________________  External Attachment:    Type:   Image     Comment:   External Document

## 2010-12-14 NOTE — Assessment & Plan Note (Signed)
Summary: PROTIME/& f/u  with Dr. Anda Latina   Vital Signs:  Patient profile:   55 year old female Height:      66 inches Weight:      232.7 pounds BMI:     37.69 Pulse rate:   49 / minute BP sitting:   131 / 83  (left arm) Cuff size:   regular  Vitals Entered By: Garen Grams LPN (August 16, 2010 4:49 PM) CC: HTN, depression, rash Is Patient Diabetic? No Pain Assessment Patient in pain? no        Primary Care Provider:  Ellery Plunk MD  CC:  HTN, depression, and rash.  History of Present Illness: HTN- continues walking program.  lost 3 lbs.  taking med as perscribed.  has stopped eating as much "junk" foods with sugar or salt  Rash-  f/u of tinea.  took 2 weeks of antifungal.  rash is improved but not gone.  no effect on her coumadin.    depression/emotional lability-  started celexa 20 which she takes.  she thinks this has helped a lot with mood swings, cravings.  wants to continue.  Habits & Providers  Alcohol-Tobacco-Diet     Tobacco Status: never  Current Medications (verified): 1)  Coumadin 5 Mg Tabs (Warfarin Sodium) .... Take By Mouth As Directed 2)  Hydrochlorothiazide 25 Mg Tabs (Hydrochlorothiazide) .... Take One Daily 3)  Celexa 20 Mg Tabs (Citalopram Hydrobromide) .... Take One Daily 4)  Diflucan 150 Mg Tabs (Fluconazole) .... Once Weekly For 6 Weeks  Allergies (verified): 1)  Keflex (Cephalexin)  Review of Systems  The patient denies anorexia, fever, chest pain, and syncope.    Physical Exam  General:  VS reviewed.  NAD Lungs:  Normal respiratory effort, chest expands symmetrically. Lungs are clear to auscultation, no crackles or wheezes. Heart:  Normal rate and regular rhythm. S1 and S2 normal without gallop, murmur, click, rub or other extra sounds. Abdomen:  Bowel sounds positive,abdomen soft and non-tender without masses, organomegaly or hernias noted. Extremities:  DP pulses full and equal.  no LE edema Skin:  bilateral elbows with pink  annular lesions-improved from previous Psych:  more interactive and cheerful than last visit.   Impression & Recommendations:  Problem # 1:  EMOTIONAL INSTABILITY (ICD-296.99) Assessment Improved continue on celexa.  pt's mood is improved and pt reports feeling better on med Orders: [facility]- Est  Level 4 (99214)  Problem # 2:  TINEA CORPORIS (ICD-110.5) Assessment: Improved improved but not resolved.  will try 4-6 weeks of diflucan once a week.  reviewed uptodate options for tinea corporis and this was a suggested treatment.  pt will continue to check INR. Orders: [facility]- Est  Level 4 (44010)  Problem # 3:  ESSENTIAL HYPERTENSION, BENIGN (ICD-401.1) Assessment: Improved continue med.  PT to RTC after holidays for next check Her updated medication list for this problem includes:    Hydrochlorothiazide 25 Mg Tabs (Hydrochlorothiazide) .Marland Kitchen... Take one daily  Orders: [facility]- Est  Level 4 (27253)  Complete Medication List: 1)  Coumadin 5 Mg Tabs (Warfarin sodium) .... Take by mouth as directed 2)  Hydrochlorothiazide 25 Mg Tabs (Hydrochlorothiazide) .... Take one daily 3)  Celexa 20 Mg Tabs (Citalopram hydrobromide) .... Take one daily 4)  Diflucan 150 Mg Tabs (Fluconazole) .... Once weekly for 6 weeks  Other Orders: INR/PT-[facility] (66440) Prescriptions: CELEXA 20 MG TABS (CITALOPRAM HYDROBROMIDE) take one daily  #90 x 3   Entered and Authorized by:   Ellery Plunk MD  Signed by:   Ellery Plunk MD on 08/17/2010   Method used:   Electronically to        [facility] (retail)       [address] [place], [place]  [address]       Ph: [phone]       Fax: [phone]   RxID:   [id number] DIFLUCAN 150 MG TABS (FLUCONAZOLE) once weekly for 6 weeks  #6 x 0   Entered and Authorized by:   Ellery Plunk MD   Signed by:   Ellery Plunk MD on 08/16/2010   Method used:   Electronically to        [facility] (retail)       [address] [place], [place]  [address]       Ph: [phone]       Fax: [phone]   RxID:   [id number]    ANTICOAGULATION RECORD PREVIOUS REGIMEN & LAB RESULTS Anticoagulation Diagnosis:  Deep venous thrombosis on  02/12/2007 Previous INR Goal Range:  2-3 on  02/12/2007 Previous INR:  2.6 on  07/26/2010 Previous Coumadin Dose(mg):  5 mg tablet on  02/12/2007 Previous Regimen:  continue same:  5 mg - M, W, F;  7.5 mg - other days on  07/26/2010 Previous Coagulation Comments:  pt started antifungal med 1 week ago on  07/20/2010  NEW REGIMEN & LAB RESULTS Current INR: 4.2 Regimen: continue same:  5 mg - M, W, F;  7.5 mg - other days  Provider: Hulen Luster Repeat testing in: 4 weeks  09-13-10 Other Comments: ...............test performed by......Marland KitchenBonnie A. [place], MLS (ASCP)cm   Dose has been reviewed with patient or caretaker during this visit. Reviewed by: Dr. Hulen Luster  Anticoagulation Visit Questionnaire Coumadin dose missed/changed:  No Coumadin Dose Comments:  actually thinks she may have doubled up one day;  took meds and then saw a warfarin tablet on the floor and took it.  now thinks maybe she took 2 doses that day. Abnormal Bleeding Symptoms:  No  Any diet changes including alcohol intake, vegetables or greens since the last visit:  No Any illnesses or hospitalizations since the last visit:  No Any signs of clotting since the last visit (including chest discomfort, dizziness, shortness of breath, arm tingling, slurred speech, swelling or redness in leg):  No  MEDICATIONS COUMADIN 5 MG TABS (WARFARIN SODIUM) Take by mouth as directed HYDROCHLOROTHIAZIDE 25 MG TABS (HYDROCHLOROTHIAZIDE) take one daily CELEXA 20 MG TABS (CITALOPRAM HYDROBROMIDE) take one daily DIFLUCAN 150 MG TABS (FLUCONAZOLE) once weekly for 6 weeks    Prevention & Chronic Care Immunizations   Influenza vaccine: given  (09/10/2008)   Influenza vaccine deferral:  Deferred  (08/16/2010)   Influenza vaccine due: 09/10/2009    Tetanus booster: Not documented   Td booster deferral: Deferred  (09/28/2009)   Tetanus booster due: Refused  (09/10/2008)    Pneumococcal vaccine: Not documented  Colorectal Screening   Hemoccult: Not documented   Hemoccult due: Not Indicated    Colonoscopy: gave number for   (09/10/2008)   Colonoscopy action/deferral: Refused  (08/16/2010)   Colonoscopy due: 09/10/2008  Other Screening   Pap smear: normal  (05/14/2008)   Pap smear action/deferral: Deferred  (08/16/2010)   Pap smear due: 08/17/2011    Mammogram: Done.  (09/19/2005)  Mammogram action/deferral: Deferred  (08/16/2010)   Mammogram due: 09/19/2006   Smoking status: never  (08/16/2010)  Lipids   Total Cholesterol: 207  (09/10/2008)   Lipid panel action/deferral: Deferred   LDL: 134  (09/10/2008)   LDL Direct: Not documented   HDL: 44  (09/10/2008)   Triglycerides: 144  (09/10/2008)   Lipid panel due: 08/17/2011  Hypertension   Last Blood Pressure: 131 / 83  (08/16/2010)   Serum creatinine: 0.69  (05/10/2010)   Serum potassium 3.9  (05/10/2010)    Hypertension flowsheet reviewed?: Yes   Progress toward BP goal: At goal    Stage of readiness to change (hypertension management): Action  Self-Management Support :    Patient will work on the following items until the next clinic visit to reach self-care goals:     Medications and monitoring: take my medicines every day, bring all of my medications to every visit, weigh myself weekly  (08/16/2010)     Eating: eat more vegetables  (08/16/2010)     Activity: take a 30 minute walk every day  (08/16/2010)    Hypertension self-management support: Written self-care plan  (08/16/2010)   Hypertension self-care plan printed.

## 2010-12-14 NOTE — Assessment & Plan Note (Signed)
Summary: bp problem,tcb   Vital Signs:  Patient profile:   55 year old female Height:      65 inches Weight:      233 pounds BMI:     38.91 BSA:     2.11 Temp:     98.4 degrees F Pulse rate:   58 / minute BP sitting:   158 / 90  (right arm) Cuff size:   large  Vitals Entered By: Jone Baseman CMA (May 03, 2010 9:40 AM) CC: BP Is Patient Diabetic? No Pain Assessment Patient in pain? no        Primary Care Provider:  Ellery Plunk MD  CC:  BP.  History of Present Illness: has had high BP readings in office for several months.  no meds.  weight is down from 239 to 233 since 3/31.  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  has vacation coming up.  trying to limit salt in diet.  reports eats a lot of fruit.  walks >47min several times per week in AM  nosesbleeds x 2 this week, never before.  does not hold nose, just waits for them to stop.  stop in 3-5 min.  they worry her.  Habits & Providers  Alcohol-Tobacco-Diet     Tobacco Status: never  Current Medications (verified): 1)  Coumadin 5 Mg Tabs (Warfarin Sodium) .... Take By Mouth As Directed 2)  Hydrochlorothiazide 25 Mg Tabs (Hydrochlorothiazide) .... Take One Daily  Allergies (verified): 1)  Keflex (Cephalexin)  Review of Systems  The patient denies anorexia, fever, chest pain, syncope, dyspnea on exertion, headaches, and abdominal pain.    Physical Exam  General:  Well-developed,well-nourished,in no acute distress; alert,appropriate and cooperative throughout examination Nose:  no visible scab  Lungs:  Normal respiratory effort, chest expands symmetrically. Lungs are clear to auscultation, no crackles or wheezes. Heart:  Normal rate and regular rhythm. S1 and S2 normal without gallop, murmur, click, rub or other extra sounds. Extremities:  trace edema bilaterally   Impression & Recommendations:  Problem # 1:  ESSENTIAL HYPERTENSION, BENIGN (ICD-401.1) start HCTZ today.  see back in 1-2  weeks for BMET and recheck INR with new med.  rec to look at [facility] diet, continue exercise/weight reduction, and to increase stress reduction efforts.  pt would prefer to only be on one med, work towards decreasing and d/c med asap so will hopefully be compliant with some lifestyle mod.  consider visit with Dr. Gerilyn Pilgrim if interested. Her updated medication list for this problem includes:    Hydrochlorothiazide 25 Mg Tabs (Hydrochlorothiazide) .Marland Kitchen... Take one daily  Orders: [facility]- Est Level  3 (99213)Future Orders: [facility] ([id number]) ... 04/27/2011  Complete Medication List: 1)  Coumadin 5 Mg Tabs (Warfarin sodium) .... Take by mouth as directed 2)  Hydrochlorothiazide 25 Mg Tabs (Hydrochlorothiazide) .... Take one daily  Patient Instructions: 1)  It was nice to meet you today.  2)  For your blood pressure: today we are starting a medicine called HCTZ.  I would like you to come back in 2 weeks to check your blood pressure and have your blood level of potassium checked.  You can come to see me or to the nurse clinic, whichever is better for your schedule. 3)  For your coumadin: HCTZ does not have a contraindication for coumadin, but make sure you get your INR checked in the next 1-2 weeks to be on the safe side 4)  For your nose bleeds:  These are  not likely to be related to your blood pressure.  When you get a nose bleed, lean forward and block the side of the nose that is bleeding.  Hold it for a few minutes.  If you have a bleed that hasn't stopped after about 10 minutes, you can use afrin.  This will constrict the blood vessels and help stop the bleed.  Used a lot, this can raise blood pressure, but you can use it every once in a while with no concern. 5)  Stress: it is wonderful that you are walking.  Take as good of care of yourself as you can (go to sleep at the same time each night, don't watch TV in bed, try to find a relaxing activity for before bed) 6)  Diet:  Look up the DASH diet on  the internet.  THis has some good suggestions for getting you off of the blood pressure medication Prescriptions: HYDROCHLOROTHIAZIDE 25 MG TABS (HYDROCHLOROTHIAZIDE) take one daily  #90 x 1   Entered and Authorized by:   Ellery Plunk MD   Signed by:   Ellery Plunk MD on 05/03/2010   Method used:   Electronically to        [facility] (retail)       [address] [place], [place]  [address]       Ph: [phone]       Fax: [phone]   RxID:   [id number] COUMADIN 5 MG TABS (WARFARIN SODIUM) Take by mouth as directed  #120 x 3   Entered and Authorized by:   Ellery Plunk MD   Signed by:   Ellery Plunk MD on 05/03/2010   Method used:   Electronically to        [facility] (retail)       [address] [place], [place]  [address]       Ph: [phone]       Fax: [phone]   RxID:   [id number] HYDROCHLOROTHIAZIDE 25 MG TABS (HYDROCHLOROTHIAZIDE) take one daily  #30 x 3   Entered and Authorized by:   Ellery Plunk MD   Signed by:   Ellery Plunk MD on 05/03/2010   Method used:   Electronically to        [facility] (retail)       [address] [place], [place]  [address]       Ph: [phone]       Fax: [phone]   RxID:   [id number]

## 2010-12-14 NOTE — Assessment & Plan Note (Signed)
Summary: f/u & labs,df   Vital Signs:  Patient profile:   55 year old female Height:      65 inches Weight:      236 pounds BMI:     39.41 BSA:     2.12 Temp:     98.8 degrees F Pulse rate:   86 / minute BP sitting:   144 / 86  Vitals Entered By: Michele Morrison CMA (July 06, 2010 3:12 PM) CC: HTN, FOot pain, skin rash, menopause Is Patient Diabetic? No Pain Assessment Patient in pain? no        Primary Care Michele Morrison:  Michele Morrison  CC:  HTN, FOot pain, skin rash, and menopause.  History of Present Illness: HTN-pt has been taking meds but has not been able to exercise due to pain in foot.  pt has been eating increased amounts of junk foods.   foot pain- pt worried she has a heel spur.  has increasing pain when she walks.  also has trouble at work standing on her feet. skin rash-rash on elbows and hands for 10 days.  had a cream at home that she tried that made it worse.  now is using lamisil that is making it slightly better. menopause- mood swings-blew up at work, also having cravings for junk food.  Habits & Providers  Alcohol-Tobacco-Diet     Tobacco Status: never  Current Medications (verified): 1)  Coumadin 5 Mg Tabs (Warfarin Sodium) .... Take By Mouth As Directed 2)  Hydrochlorothiazide 25 Mg Tabs (Hydrochlorothiazide) .... Take One Daily 3)  Celexa 20 Mg Tabs (Citalopram Hydrobromide) .... Take One Daily 4)  Terbinafine Hcl 250 Mg Tabs (Terbinafine Hcl) .... Take One By Mouth Daily For 2 Weeks  Allergies (verified): 1)  Keflex (Cephalexin)  Review of Systems  The patient denies anorexia, chest pain, syncope, and abdominal pain.    Physical Exam  General:  Well-developed,well-nourished,in no acute distress; alert,appropriate and cooperative throughout examination Msk:  right foot with pain to palpation in center of heel.  no swelling, no bruising, full ROM. Skin:  elbows with red papular circular rash with central clearing.  smaller red papular  rash on dorsal surfaces of both hands.   Impression & Recommendations:  Problem # 1:  ESSENTIAL HYPERTENSION, BENIGN (ICD-401.1) Assessment Unchanged pt not eating better or exercising.  is taking medication.  will need to continue medication for now.  at goal of 140/90 but will encourage lifestyle change. Orders: [facility]- Est  Level 4 ([address])  Her updated medication list for this problem includes:    Hydrochlorothiazide 25 Mg Tabs (Hydrochlorothiazide) .Marland Kitchen... Take one daily  Problem # 2:  EMOTIONAL INSTABILITY (ICD-296.99) Assessment: New pt having trouble with moods, affecting work and concentration.  willing to start celexa but unwilling to go to therapy.  thinks this is related to menapause.  will start celexa 20mg .  consider increasing if necessary.  will keep encouraging therapy as I think it might be useful for her. Orders: [facility]- Est  Level 4 ([address])  Problem # 3:  HEEL PAIN, RIGHT (ICD-729.5) Assessment: New will send to Sports medicine for evaluation. ? plantar fascitis vs. stress fracture.   Orders: [facility]- Est  Level 4 ([address])  Complete Medication List: 1)  Coumadin 5 Mg Tabs (Warfarin sodium) .... Take by mouth as directed 2)  Hydrochlorothiazide 25 Mg Tabs (Hydrochlorothiazide) .... Take one daily 3)  Celexa 20 Mg Tabs (Citalopram hydrobromide) .... Take one daily 4)  Terbinafine Hcl 250 Mg Tabs (Terbinafine  hcl) .... Take one by mouth daily for 2 weeks  Other Orders: INR/PT-[facility] ([address])  Patient Instructions: 1)  take the terbinafine for 2 weeks for the ring worm.   2)  start the celexa to see if it helps your mood.   3)  make an appt for sports medicine clinic for your foot.   4)  come back in 6 weeks to see how you are doing on the medication. Prescriptions: HYDROCHLOROTHIAZIDE 25 MG TABS (HYDROCHLOROTHIAZIDE) take one daily  #90 x 3   Entered and Authorized by:   Michele Morrison   Signed by:   Michele Morrison   Method used:   Electronically to         [facility] [phone]* (retail)       [address] [place], [place]  [address]       Ph: [phone]       Fax: [phone]   RxID:   [id number] TERBINAFINE HCL 250 MG TABS (TERBINAFINE HCL) take one by mouth daily for 2 weeks  #14 x 0   Entered and Authorized by:   Michele Morrison   Signed by:   Michele Morrison   Method used:   Electronically to        [facility] [phone]* (retail)       [address] [place], [place]  [address]       Ph: [phone]       Fax: [phone]   RxID:   [id number] CELEXA 20 MG TABS (CITALOPRAM HYDROBROMIDE) take one daily  #30 x 3   Entered and Authorized by:   Michele Morrison   Signed by:   Michele Morrison   Method used:   Electronically to        [facility] [phone]* (retail)       [address] [place], [place]  [address]       Ph: [phone]       Fax: [phone]   RxID:   [id number]    ANTICOAGULATION RECORD PREVIOUS REGIMEN & LAB RESULTS Anticoagulation Diagnosis:  Deep venous thrombosis on  02/12/2007 Previous INR Goal Range:  2-3 on  02/12/2007 Previous INR:  3.0 on  06/07/2010 Previous Coumadin Dose(mg):  5 mg tablet on  02/12/2007 Previous Regimen:  continue 5mg  M,W,F; 7.5mg  other days on  06/07/2010 Previous Coagulation Comments:  patient has been eating an increased number of salads  on  12/29/2008  NEW REGIMEN & LAB RESULTS Current INR: 3.1 Regimen: continue 5mg  M,W,F; 7.5mg  other days  Michele Morrison: Michele Morrison Repeat testing in: 4 weeks Other Comments: ...........test performed by...........Marland KitchenTerese Morrison, CMA   Dose has been reviewed with patient or caretaker during this visit. Reviewed by: Michele Morrison  Anticoagulation Visit Questionnaire Coumadin dose missed/changed:  No Abnormal Bleeding Symptoms:  No  Any diet changes including alcohol intake, vegetables  or greens since the last visit:  No Any illnesses or hospitalizations since the last visit:  No Any signs of clotting since the last visit (including chest discomfort, dizziness, shortness of breath, arm tingling, slurred speech, swelling or redness in leg):  No  MEDICATIONS COUMADIN 5 MG TABS (WARFARIN SODIUM) Take  by mouth as directed HYDROCHLOROTHIAZIDE 25 MG TABS (HYDROCHLOROTHIAZIDE) take one daily CELEXA 20 MG TABS (CITALOPRAM HYDROBROMIDE) take one daily TERBINAFINE HCL 250 MG TABS (TERBINAFINE HCL) take one by mouth daily for 2 weeks

## 2010-12-14 NOTE — Assessment & Plan Note (Signed)
Summary: check incision/[place]/spiegel   Vital Signs:  Patient profile:   55 year old female Weight:      239.8 pounds Temp:     98 degrees F oral Pulse rate:   66 / minute BP sitting:   184 / 84  Vitals Entered By: Loralee Pacas CMA (February 11, 2010 9:50 AM)  Primary Care Provider:  Ellery Plunk MD  CC:  eval incision.  History of Present Illness: 55 y/o female here for recheck wound. had excisional bx on 3/28 for concerning lesion. the day after, she noticed R hand swelling, and TTP as well as erythema around excision site. patient also had subjective fever/chills. swelling resolved, but TTP and surrounding erythema persist.   Current Medications (verified): 1)  Coumadin 5 Mg Tabs (Warfarin Sodium) .... Take By Mouth As Directed 2)  Doxycycline Hyclate 100 Mg Caps (Doxycycline Hyclate) .... One Tab By Mouth Twice A Day For A Week  Allergies (verified): 1)  Keflex (Cephalexin)  Physical Exam  General:  obese female, NAD. vitals reviewed.  Skin:  excision site on R forearm with incisions C/D/I. 1.5cm of surrounding erythema. no drainage. +TTP especially at medial aspect.    Impression & Recommendations:  Problem # 1:  CELLULITIS AND ABSCESS OF UPPER ARM AND FOREARM (ICD-682.3) Assessment New  history and exam concerning for evolving cellulits. allergy to cephalosporins. will treat with doxycycline. red flags given. f/u as scheduled for suture removal.   Her updated medication list for this problem includes:    Doxycycline Hyclate 100 Mg Caps (Doxycycline hyclate) ..... One tab by mouth twice a day for a week  Orders: Postop Wound Infection (CPT-10180)  Patient Instructions: 1)  IF you have worsening redness, fever, drainage, or other concerns, call our office right away Prescriptions: DOXYCYCLINE HYCLATE 100 MG CAPS (DOXYCYCLINE HYCLATE) one tab by mouth twice a day for a week  #14 x 0   Entered and Authorized by:   Lequita Asal  MD   Signed by:   Lequita Asal   MD on 02/11/2010   Method used:   Electronically to        [id number]* (retail)       [address] [place], [place]  [address]       Ph: [phone]       Fax: [phone]   RxID:   [id number]   Appended Document: check incision/[place]/spiegel    Clinical Lists Changes  Problems: Added new problem of SEBORRHEIC KERATOSIS (ICD-702.19) Orders: Added new Test order of Cryo (1st lesion) benign - [facility] ([address]) - Signed

## 2010-12-14 NOTE — Assessment & Plan Note (Signed)
Summary: NP/heel pain/eo   Vital Signs:  Patient profile:   55 year old female Height:      66 inches Weight:      235 pounds BP sitting:   153 / 84  Vitals Entered By: Lillia Pauls CMA (July 12, 2010 3:03 PM)  Primary Care Provider:  Ellery Plunk MD   History of Present Illness: right heel pain 1 year especially with standing or walking a lot. Pain sharp. center of heel. Does not radiate. Rest makes it some better. has tried many many types of shoes. Has had to stop walking (her exercise0 due to increased pain. Has a lot of callous on her feet.   PERTINENT PMH/PSH: No foot injury. No prior foot surgery not diabteic  Current Medications (verified): 1)  Coumadin 5 Mg Tabs (Warfarin Sodium) .... Take By Mouth As Directed 2)  Hydrochlorothiazide 25 Mg Tabs (Hydrochlorothiazide) .... Take One Daily 3)  Celexa 20 Mg Tabs (Citalopram Hydrobromide) .... Take One Daily 4)  Terbinafine Hcl 250 Mg Tabs (Terbinafine Hcl) .... Take One By Mouth Daily For 2 Weeks  Allergies: 1)  Keflex (Cephalexin)  Family History: Reviewed history from 02/07/2008 and no changes required. dad has non-hodgkin`s lymphoma no hx of MI, DM, CAD, CVA  Social History: Reviewed history from 05/10/2010 and no changes required. Single.  No etoh, no tob, no drug.  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 walks 20min/day.    Review of Systems       no numbness in toes or feet.  Please see HPI for additional ROS.   Physical Exam  General:  alert, well-developed, well-nourished, well-hydrated, and overweight-appearing.   Msk:  Gait: sligt out toe on right.  FEET: B pes planus B severe first ray bunilns Right heel TTP oribgin plantar fascia. B feet have almost  total loss of transverse arch. prominent MT heads. NEURO: Neurovaascularly intact B feet.  SKIN; well healed scar rigt medua; anklle, significant calolous B plantar first MTP and heel   Impression & Recommendations:  Problem # 1:   PLANTAR FASCIITIS, RIGHT (ICD-728.71) > 50% 40 minute ov spent on education re plantar fasciitis, counseling re options. Will have her d/c the 'rocker" shoes she has now, rtc for custom molded oprthotics and probable injection.  Complete Medication List: 1)  Coumadin 5 Mg Tabs (Warfarin sodium) .... Take by mouth as directed 2)  Hydrochlorothiazide 25 Mg Tabs (Hydrochlorothiazide) .... Take one daily 3)  Celexa 20 Mg Tabs (Citalopram hydrobromide) .... Take one daily 4)  Terbinafine Hcl 250 Mg Tabs (Terbinafine hcl) .... Take one by mouth daily for 2 weeks

## 2010-12-14 NOTE — Assessment & Plan Note (Signed)
Summary: remove stitches/kh  Nurse Visit  patient in office for suture removal. 3 sutures removed . Dr. Fredric Mare removed one stitch that was embedded slightly.  incision appears healed well. no redness at this time. she is taking antibiotic as directed.  strei strips applied as extra precaution. Theresia Lo RN  February 15, 2010 12:22 PM    Allergies: 1)  Keflex (Cephalexin)  Orders Added: 1)  No Charge Patient Arrived (NCPA0) [NCPA0]

## 2010-12-14 NOTE — Letter (Signed)
Summary: Generic Letter  [facility]  [address]   [place], [place] [address]   Phone: [phone]  Fax: [phone]    02/10/2010  Michele Morrison [address] [place], [place]  [address]  Dear [name],  The pathology from your arm is back--this was not cancer.  It was a normal variation on a mole called a "blue nevus."  Sincerely, Romero Belling MD  Appended Document: Generic Letter mailed.

## 2010-12-25 ENCOUNTER — Encounter: Payer: Self-pay | Admitting: Family Medicine

## 2010-12-25 DIAGNOSIS — Z8672 Personal history of thrombophlebitis: Secondary | ICD-10-CM

## 2010-12-25 DIAGNOSIS — Z7901 Long term (current) use of anticoagulants: Secondary | ICD-10-CM

## 2010-12-29 ENCOUNTER — Encounter: Payer: Self-pay | Admitting: *Deleted

## 2011-01-03 ENCOUNTER — Ambulatory Visit: Payer: 59

## 2011-01-03 DIAGNOSIS — Z8672 Personal history of thrombophlebitis: Secondary | ICD-10-CM

## 2011-01-03 DIAGNOSIS — Z7901 Long term (current) use of anticoagulants: Secondary | ICD-10-CM

## 2011-01-03 LAB — PROTIME-INR: INR: 2.2 — AB (ref 0.9–1.1)

## 2011-01-05 ENCOUNTER — Ambulatory Visit: Payer: 59

## 2011-01-31 ENCOUNTER — Ambulatory Visit (INDEPENDENT_AMBULATORY_CARE_PROVIDER_SITE_OTHER): Payer: 59 | Admitting: *Deleted

## 2011-01-31 DIAGNOSIS — Z7901 Long term (current) use of anticoagulants: Secondary | ICD-10-CM

## 2011-01-31 DIAGNOSIS — Z8672 Personal history of thrombophlebitis: Secondary | ICD-10-CM

## 2011-01-31 LAB — POCT INR: INR: 2

## 2011-02-11 ENCOUNTER — Other Ambulatory Visit: Payer: Self-pay | Admitting: Family Medicine

## 2011-02-11 NOTE — Telephone Encounter (Signed)
Refill request

## 2011-02-28 ENCOUNTER — Ambulatory Visit (INDEPENDENT_AMBULATORY_CARE_PROVIDER_SITE_OTHER): Payer: 59 | Admitting: *Deleted

## 2011-02-28 DIAGNOSIS — Z7901 Long term (current) use of anticoagulants: Secondary | ICD-10-CM

## 2011-02-28 DIAGNOSIS — Z8672 Personal history of thrombophlebitis: Secondary | ICD-10-CM

## 2011-02-28 LAB — POCT INR: INR: 2.4

## 2011-03-28 ENCOUNTER — Ambulatory Visit: Payer: 59

## 2011-04-01 NOTE — Op Note (Signed)
[name], [name]                ACCOUNT NO.:  [id number]   MEDICAL RECORD NO.:  [id number]          PATIENT TYPE:  AMB   LOCATION:  [facility]                          FACILITY:  [facility]   PHYSICIAN:  Lubertha Basque. Dalldorf, M.D.DATE OF BIRTH:  05-Jan-1956   DATE OF PROCEDURE:  05/09/2006  DATE OF DISCHARGE:                                 OPERATIVE REPORT   PREOPERATIVE DIAGNOSES:  1.  Right shoulder impingement.  2.  Right shoulder rotator cuff tear.  3.  Right shoulder acromioclavicular joint spur.   PROCEDURES:  1.  Right shoulder arthroscopic acromioplasty.  2.  Right shoulder arthroscopic rotator cuff repair.  3.  Right shoulder arthroscopic partial claviculectomy.  4.  Right shoulder arthroscopic biceps debridement.   ANESTHESIA:  General and block.   ATTENDING SURGEON:  Lubertha Basque. Jerl Santos, M.D.   ASSESSMENT:  Michele Morrison, P.A.   INDICATIONS FOR PROCEDURE:  The patient is a 55 year old woman who fell at  work 6 or 7 months ago.  She suffered a rotator cuff tear.  She has been  treated with oral anti-inflammatories and an injection and some therapy but  persists with a painful arc of motion and a positive drop arm test.  She  also has significant weakness.  By MRI scan she has a retracted and fairly  large rotator cuff tear.  With pain at rest and pain which limits her  ability use her arm, she is offered an arthroscopy.  Informed operative  consent was obtained after discussion of the possible complications of,  reaction to anesthesia and infection as well as neurovascular injury.   SUMMARY AND FINDINGS:  Under general anesthesia through four portals, an  arthroscopy of the right shoulder was performed.  She also received a  regional block.  She did have a large, mildly retracted rotator cuff tear  but fairly good tissue and good mobility of this tissue.  We performed a  subacromial decompression with an acromioplasty and also removed a spur from  the distal clavicle.  I  also repaired her rotator cuff with 4 simple sutures  using 2 Arthrex anchors.  This seemed to bring her rotator cuff back to a  nice bleeding bed of bone.  The biceps tendon had a partial tear and about a  10% debridement was required here, but there were no degenerative changes in  the glenohumeral joint and all labral structures appeared intact.  Michele Morrison assisted throughout and was invaluable to the completion of this  case in that he held the arthroscopic equipment and instruments while I  passed sutures and tied knots.  He also closed the portals.   DESCRIPTION OF PROCEDURE:  The patient was taken to the operating suite,  where a general anesthetic was applied without difficulty.  She was also  given a block in the preanesthesia area.  She was positioned in a beach-  chair position and prepped and draped in the normal sterile fashion.  After  the administration of preop IV clindamycin, arthroscopy of the right  shoulder was performed through a total of 4 portals.  The  glenohumeral joint  showed no degenerative changes and the biceps tendon was partially torn in  the joint.  A 10% debridement was done back to stable tissues.  The rest of  the tendon was pulled into the joint and appeared fairly benign.  She had a  large rotator cuff tear easily seen from below.  In the subacromial space  she had some spurring of the distal clavicle, addressed with a partial  claviculectomy without a formal AC decompression.  She had a prominent  subacromial morphology, addressed with an acromioplasty back to a flat  surface, done with a bur in the lateral position.  The rotator cuff was then  thoroughly examined.  It looked as though I could reapproximate the tissue  easily to the greater tuberosity without significant dissection.  The tissue  felt fairly good and at age 38 was felt worth repairing.  I created a  bleeding bed of bone with an arthroscopic bur and then placed 2 of the  Arthrex  5.5-mm anchors with 2 sutures emanating from each.  These then  passed through the rotator cuff with the scorpion device and tied in simple  fashion back over the bleeding bed of bone.  This 4-suture repair seemed to  bring things back together well.  The shoulder was thoroughly irrigated,  followed by reapproximation of the portals loosely with nylon.  Adaptic was  applied along with a dry gauze dressing and tape.  Estimated blood loss and  intraoperative fluids can be obtained from anesthesia records.   DISPOSITION:  The patient was extubated in the operating room and taken to  the recovery room in stable addition.  She was to go home the same day and  follow up in the office in less than a week.  I will contact her by phone  tonight.      Lubertha Basque Jerl Santos, M.D.  Electronically Signed     PGD/MEDQ  D:  05/09/2006  T:  05/09/2006  Job:  [id number]

## 2011-04-04 ENCOUNTER — Ambulatory Visit (INDEPENDENT_AMBULATORY_CARE_PROVIDER_SITE_OTHER): Payer: 59 | Admitting: *Deleted

## 2011-04-04 DIAGNOSIS — Z8672 Personal history of thrombophlebitis: Secondary | ICD-10-CM

## 2011-04-04 DIAGNOSIS — Z7901 Long term (current) use of anticoagulants: Secondary | ICD-10-CM

## 2011-04-04 LAB — POCT INR: INR: 1.7

## 2011-04-25 ENCOUNTER — Ambulatory Visit (INDEPENDENT_AMBULATORY_CARE_PROVIDER_SITE_OTHER): Payer: 59 | Admitting: *Deleted

## 2011-04-25 DIAGNOSIS — Z7901 Long term (current) use of anticoagulants: Secondary | ICD-10-CM

## 2011-04-25 DIAGNOSIS — Z8672 Personal history of thrombophlebitis: Secondary | ICD-10-CM

## 2011-05-23 ENCOUNTER — Other Ambulatory Visit: Payer: Self-pay | Admitting: Family Medicine

## 2011-05-23 NOTE — Telephone Encounter (Signed)
Refill request

## 2011-05-30 ENCOUNTER — Ambulatory Visit (INDEPENDENT_AMBULATORY_CARE_PROVIDER_SITE_OTHER): Payer: 59 | Admitting: *Deleted

## 2011-05-30 DIAGNOSIS — Z7901 Long term (current) use of anticoagulants: Secondary | ICD-10-CM

## 2011-05-30 DIAGNOSIS — Z8672 Personal history of thrombophlebitis: Secondary | ICD-10-CM

## 2011-06-13 ENCOUNTER — Ambulatory Visit (HOSPITAL_COMMUNITY)
Admission: RE | Admit: 2011-06-13 | Discharge: 2011-06-13 | Disposition: A | Discharge status: Home / Self Care | Payer: 59 | Source: Ambulatory Visit | Attending: Orthopaedic Surgery | Admitting: Orthopaedic Surgery

## 2011-06-13 ENCOUNTER — Other Ambulatory Visit: Payer: Self-pay | Admitting: Orthopaedic Surgery

## 2011-06-13 ENCOUNTER — Encounter (HOSPITAL_COMMUNITY)
Admission: RE | Admit: 2011-06-13 | Discharge: 2011-06-13 | Disposition: A | Discharge status: Home / Self Care | Payer: 59 | Source: Ambulatory Visit | Attending: Orthopaedic Surgery | Admitting: Orthopaedic Surgery

## 2011-06-13 DIAGNOSIS — Z01818 Encounter for other preprocedural examination: Secondary | ICD-10-CM | POA: Insufficient documentation

## 2011-06-13 DIAGNOSIS — Z01811 Encounter for preprocedural respiratory examination: Secondary | ICD-10-CM

## 2011-06-13 DIAGNOSIS — Z01812 Encounter for preprocedural laboratory examination: Secondary | ICD-10-CM | POA: Insufficient documentation

## 2011-06-13 LAB — CBC
MCH: 30.1 pg (ref 26.0–34.0)
MCHC: 32.9 g/dL (ref 30.0–36.0)
MCV: 91.4 fL (ref 78.0–100.0)
Platelets: 239 10*3/uL (ref 150–400)
RBC: 4.55 MIL/uL (ref 3.87–5.11)
RDW: 14.1 % (ref 11.5–15.5)

## 2011-06-13 LAB — BASIC METABOLIC PANEL
BUN: 17 mg/dL (ref 6–23)
CO2: 31 mEq/L (ref 19–32)
Calcium: 10.7 mg/dL — ABNORMAL HIGH (ref 8.4–10.5)
Glucose, Bld: 93 mg/dL (ref 70–99)
Sodium: 139 mEq/L (ref 135–145)

## 2011-06-13 LAB — ABO/RH: ABO/RH(D): A POS

## 2011-06-13 LAB — SURGICAL PCR SCREEN
MRSA, PCR: NEGATIVE
Staphylococcus aureus: POSITIVE — AB

## 2011-06-13 LAB — TYPE AND SCREEN: ABO/RH(D): A POS

## 2011-06-21 ENCOUNTER — Inpatient Hospital Stay (HOSPITAL_COMMUNITY)
Admission: RE | Admit: 2011-06-21 | Discharge: 2011-06-24 | DRG: 470 | Disposition: A | Discharge status: Home Health | Payer: 59 | Source: Ambulatory Visit | Attending: Orthopaedic Surgery | Admitting: Orthopaedic Surgery

## 2011-06-21 DIAGNOSIS — I10 Essential (primary) hypertension: Secondary | ICD-10-CM | POA: Diagnosis present

## 2011-06-21 DIAGNOSIS — M1710 Unilateral primary osteoarthritis, unspecified knee: Principal | ICD-10-CM | POA: Diagnosis present

## 2011-06-21 DIAGNOSIS — Z79899 Other long term (current) drug therapy: Secondary | ICD-10-CM

## 2011-06-21 DIAGNOSIS — E876 Hypokalemia: Secondary | ICD-10-CM | POA: Diagnosis not present

## 2011-06-21 DIAGNOSIS — Z86718 Personal history of other venous thrombosis and embolism: Secondary | ICD-10-CM

## 2011-06-21 DIAGNOSIS — Z7901 Long term (current) use of anticoagulants: Secondary | ICD-10-CM

## 2011-06-21 LAB — PROTIME-INR: INR: 0.93 (ref 0.00–1.49)

## 2011-06-22 LAB — CBC
HCT: 35.5 % — ABNORMAL LOW (ref 36.0–46.0)
MCHC: 31.8 g/dL (ref 30.0–36.0)
MCV: 94.2 fL (ref 78.0–100.0)
RDW: 14.3 % (ref 11.5–15.5)

## 2011-06-22 LAB — BASIC METABOLIC PANEL
BUN: 9 mg/dL (ref 6–23)
Chloride: 102 mEq/L (ref 96–112)
Creatinine, Ser: <0.47 mg/dL — ABNORMAL LOW (ref 0.50–1.10)

## 2011-06-23 LAB — PROTIME-INR: INR: 1.3 (ref 0.00–1.49)

## 2011-06-23 LAB — CBC
HCT: 34.9 % — ABNORMAL LOW (ref 36.0–46.0)
MCHC: 32.1 g/dL (ref 30.0–36.0)
RDW: 14.5 % (ref 11.5–15.5)

## 2011-06-23 LAB — BASIC METABOLIC PANEL
BUN: 5 mg/dL — ABNORMAL LOW (ref 6–23)
CO2: 32 mEq/L (ref 19–32)
Calcium: 9.2 mg/dL (ref 8.4–10.5)
Creatinine, Ser: 0.49 mg/dL — ABNORMAL LOW (ref 0.50–1.10)

## 2011-06-24 LAB — BASIC METABOLIC PANEL
BUN: 7 mg/dL (ref 6–23)
Calcium: 9.5 mg/dL (ref 8.4–10.5)
Creatinine, Ser: <0.47 mg/dL — ABNORMAL LOW (ref 0.50–1.10)
Glucose, Bld: 100 mg/dL — ABNORMAL HIGH (ref 70–99)
Sodium: 137 mEq/L (ref 135–145)

## 2011-06-24 LAB — CBC
MCH: 29.8 pg (ref 26.0–34.0)
MCHC: 31.9 g/dL (ref 30.0–36.0)
MCV: 93.3 fL (ref 78.0–100.0)
Platelets: 152 10*3/uL (ref 150–400)
RDW: 14.4 % (ref 11.5–15.5)
WBC: 6.8 10*3/uL (ref 4.0–10.5)

## 2011-06-26 ENCOUNTER — Telehealth: Payer: Self-pay | Admitting: Family Medicine

## 2011-06-26 NOTE — Telephone Encounter (Signed)
Pt had knee replacement and had to stop her coumadin for some time, surgery Tuesday started coumadin on wednesday, INR on Friday was 1.9.  Pt is taking 7.5mg  of her coumadin.  Pt states behind her left knee has a hard area that is a little tender noticed she had maybe a little redness there as well.  Pt states still able to bear weight if necessary.  Pt denies any shortness of breath dyspnea on exertion or chest pain.  Told her at this time would not likely change management but gave her red flags to look out for and when to seek medical attention.  Told her she could come to the ED and would get U/S to confirm but once again if INR between 2-3 then would not change management at this time.  Pt states she will wait for her home health nurse to come tomorrow.  Reiterated the signs of worsening problem and pt agreed to come in if worsens but states she does not want to come in now.

## 2011-06-27 ENCOUNTER — Ambulatory Visit: Payer: 59

## 2011-06-29 NOTE — H&P (Signed)
  NAMEJANESIA, Michele Morrison                ACCOUNT NO.:  [id number]  MEDICAL RECORD NO.:  [id number]  LOCATION:                                 FACILITY:  PHYSICIAN:  Lubertha Basque. Gurkaran Rahm, M.D.DATE OF BIRTH:  Mar 17, 1956  DATE OF ADMISSION:  06/21/2011 DATE OF DISCHARGE:                             HISTORY & PHYSICAL   CHIEF COMPLAINT:  Left knee pain.  HISTORY OF PRESENT ILLNESS:  This patient is a patient well known to our practice, who has had increasing left knee pain.  Her x-rays reveal bone- on-bone end-stage DJD.  She is having pain with every step, trouble sleeping at nighttime, being comfortable despite of oral anti- inflammatory medications and corticosteroid injections in the past.  She continues to have this discomfort, and we have discussed with her total knee replacement surgery.  Her PCP is [facility].  She has an allergy to [facility].  She is not a diabetic.  No cholesterol issues.  Additional 14-point review of systems positive for glasses, hypertension, cough, history of DVT after a plane flight on chronic Coumadin.  She has had shoulder surgery in 2007.  FAMILY HISTORY:  Negative for diabetes.  Negative for hypertension. Positive for heart disease.  SOCIAL HISTORY:  She does not smoke.  She is single and does not list alcohol as does not currently use alcohol.  The only medicine she is on is chronic Coumadin once again for DVT that she suffered following a plane flight in 1996.  PHYSICAL EXAMINATION:  VITAL SIGNS:  Stable.  Pulse is regular 72, respirations 16, temperature 98. GENERAL:  Appropriately oriented with speech and behavior.  No apparent acute distress.  Walks with a slight antalgic gait.  HEENT:  Eyes: PERRLA.  Oropharynx is clear.  Cervical motion full.  No adenopathy noted. LUNGS:  Clear to A and P. CARDIAC:  Regular rate and rhythm. MUSCULOSKELETAL:  Shoulder, elbow, wrist motion all full and painfree with good reflexes and normal  neurovascular status.  Hips move through full range of motion.  Knees left side 0-110, right side 0-125 TO 130. There is crepitation in the left knee with range of motion, medial and patellofemoral.  She is able to do straight leg raise.  No lower extremity edema noted.  Good neurovascular status to her toes.  Calf soft and nontender.  Negative Homans.  ASSESSMENT: 1. 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 2. On chronic Coumadin therapy with history of deep venous thrombosis     from 1996 following a plane flight.  PLAN:  Plan is to do a left knee replacement and then admit the patient postoperatively continuing her on Coumadin and using Lovenox to keep her anticoagulated until her INRs is between 2.0-3.0 and then institute pain control and physical therapy postoperatively.     Lindwood Qua, P.A.   ______________________________ Lubertha Basque. Jerl Santos, M.D.    MC/MEDQ  D:  06/20/2011  T:  06/20/2011  Job:  [id number]  Electronically Signed by Lindwood Qua P.A. on 06/23/2011 02:40:33 PM Electronically Signed by Marcene Corning M.D. on 06/29/2011 09:01:21 PM

## 2011-06-29 NOTE — Op Note (Signed)
Michele Morrison, TOOTHMAN                ACCOUNT NO.:  [id number]  MEDICAL RECORD NO.:  [id number]  LOCATION:  5005                         FACILITY:  [facility]  PHYSICIAN:  Lubertha Basque. Keyonni Percival, M.D.DATE OF BIRTH:  1955/11/25  DATE OF PROCEDURE:  06/21/2011 DATE OF DISCHARGE:                              OPERATIVE REPORT   PREOPERATIVE DIAGNOSIS:  Left knee degenerative joint disease.  POSTOPERATIVE DIAGNOSIS:  Left knee degenerative joint disease.  PROCEDURE:  Left total knee replacement.  ANESTHESIA:  General and block.  ATTENDING SURGEON:  Lubertha Basque. Jerl Santos, MD  ASSISTANT:  Lindwood Qua, PA   INDICATIONS FOR PROCEDURE:  The patient is a 55 year old woman with a long history of painful knees.  On the left side, she has failed injections and pills.  On x-ray, she has an obvious OCD and this was probably fragmented and loose in her knee.  She has disabling pain, which makes it difficult for her to walk and stand and rest.  She isoffered a knee replacement on the left, which is her most painful side. Informed operative consent was obtained after discussion of possible complications including reaction to anesthesia, infection, DVT, PE, and death.  The importance of the postoperative rehabilitation protocol to optimize result was stressed extensively with the patient.  SUMMARY, FINDINGS, AND PROCEDURE:  Under general anesthesia and a block, a left knee replacement was performed.  She had a very large OCD involving a good portion of the medial femoral condyle.  This was broken free and loose in her knee.  She had good bone quality.  I addressed her problem with a cemented DePuy system using standard plus femur, 4 MBT tibial tray to address her stature, 35 all-polyethylene patella, and 10mm deep dish spacer.  I did include tobramycin in the cement.  She was admitted for appropriate postop care to include perioperative antibiotics and Coumadin plus Lovenox for DVT prophylaxis. Lindwood Qua assisted throughout and was invaluable to the completion of the case, in that he helped position and retract while I performed the procedure.  He also closed simultaneously to help minimize the OR time.  DESCRIPTION OF PROCEDURE:  The patient was taken to the operating suite where general anesthetic was applied without difficulty.  She was also given a block in the preanesthesia area.  She was positioned supine and prepped and draped in a normal sterile fashion.  After administration of preop IV vancomycin, the left leg was elevated, exsanguinated, tourniquet inflated about the thigh.  A longitudinal anterior incision was made with dissection down the extensor mechanism.  All appropriate antiinfective measures were used including the preoperative IV antibiotic, Betadine-impregnated drape, and closed hooded exhaust systems for each member of surgical team.  A medial parapatellar incision was made.  The kneecap was flipped and the knee flexed.  Some residual meniscal tissues in the ACL and PCL were removed.  An intramedullary guide was then placed in the tibia to make a roughly flat cut.  A second intramedullary guide was placed in the femur to make anterior and posterior cuts creating a flexion gap of 10 mm.  Another intramedullary guide was placed in the femur to make a distal cut creating  an equal extension gap of 10 mm balancing the knee.  Minimal soft tissue release was required.  The femur sized to a standard plus and the tibia to a 4 with the appropriate guides placed and utilized including reaming for the short MBT stem in the tibia.  The patella was cut down to thickness by about 10 mm to 15 and sized to a 35 with appropriate guide placed and utilized.  A trial reduction was done with all these components and the 10 spacer.  She easily came to full extension and flexed well.  The trial components were removed followed by pulsatile lavage and irrigation of all 3 cut bony  surfaces.  Cement was mixed with tobramycin and was pressurized onto the bones followed by placement of the aforementioned DePuy LCS components.  Excess cement was trimmed and pressure was held on the components until the cement hardened.  The tourniquet was deflated and a small amount of bleeding was easily controlled with Bovie cautery and pressure.  The knee was irrigated followed by placement of a drain exiting superolaterally.  The extensor mechanism was reapproximated with #1 Vicryl in an interrupted fashion followed by subcutaneous reapproximation with 0 and 2-0 undyed Vicryl and skin closure with staples.  Adaptic was applied followed by dry gauze and loose Ace wrap.  Estimated blood loss and intraoperative fluids can be obtained from anesthesia records as can accurate tourniquet time.  DISPOSITION:  The patient was extubated in the operating room and taken to the recovery room in stable addition.  She was to be admitted to the Orthopedic Surgery Service to proceed with postop care to include perioperative antibiotics and Coumadin plus Lovenox for DVT prophylaxis.     Lubertha Basque Jerl Santos, M.D.     PGD/MEDQ  D:  06/21/2011  T:  06/21/2011  Job:  [id number]  Electronically Signed by Marcene Corning M.D. on 06/29/2011 09:03:17 PM
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
## 2011-07-27 ENCOUNTER — Ambulatory Visit (INDEPENDENT_AMBULATORY_CARE_PROVIDER_SITE_OTHER): Payer: 59 | Admitting: *Deleted

## 2011-07-27 DIAGNOSIS — Z7901 Long term (current) use of anticoagulants: Secondary | ICD-10-CM

## 2011-07-27 DIAGNOSIS — Z8672 Personal history of thrombophlebitis: Secondary | ICD-10-CM

## 2011-08-19 ENCOUNTER — Other Ambulatory Visit: Payer: Self-pay | Admitting: Family Medicine

## 2011-08-19 LAB — DIFFERENTIAL
Basophils Relative: 0 % (ref 0–1)
Lymphs Abs: 1.1 10*3/uL (ref 0.7–4.0)
Monocytes Absolute: 0.3 10*3/uL (ref 0.1–1.0)
Monocytes Relative: 3 % (ref 3–12)
Neutro Abs: 9.9 10*3/uL — ABNORMAL HIGH (ref 1.7–7.7)
Neutrophils Relative %: 87 % — ABNORMAL HIGH (ref 43–77)

## 2011-08-19 LAB — COMPREHENSIVE METABOLIC PANEL
BUN: 15 mg/dL (ref 6–23)
CO2: 24 mEq/L (ref 19–32)
Calcium: 9.6 mg/dL (ref 8.4–10.5)
Chloride: 107 mEq/L (ref 96–112)
Creatinine, Ser: 0.82 mg/dL (ref 0.4–1.2)
GFR calc non Af Amer: >60 mL/min (ref >60)
Glucose, Bld: 130 mg/dL — ABNORMAL HIGH (ref 70–99)
Total Bilirubin: 0.7 mg/dL (ref 0.3–1.2)

## 2011-08-19 LAB — PROTIME-INR: INR: 2.4 — ABNORMAL HIGH (ref 0.00–1.49)

## 2011-08-19 LAB — POCT PREGNANCY, URINE: Preg Test, Ur: NEGATIVE

## 2011-08-19 LAB — URINE MICROSCOPIC-ADD ON

## 2011-08-19 LAB — URINALYSIS, ROUTINE W REFLEX MICROSCOPIC
Bilirubin Urine: NEGATIVE
Ketones, ur: >80 mg/dL — AB
Leukocytes, UA: NEGATIVE
Nitrite: NEGATIVE
Protein, ur: 100 mg/dL — AB

## 2011-08-19 LAB — CBC
Hemoglobin: 13.4 g/dL (ref 12.0–15.0)
MCHC: 32.8 g/dL (ref 30.0–36.0)
RBC: 4.44 MIL/uL (ref 3.87–5.11)
WBC: 11.4 10*3/uL — ABNORMAL HIGH (ref 4.0–10.5)

## 2011-08-19 NOTE — Telephone Encounter (Signed)
Refill reqest

## 2011-08-22 ENCOUNTER — Other Ambulatory Visit: Payer: Self-pay | Admitting: Family Medicine

## 2011-08-22 NOTE — Telephone Encounter (Signed)
Refill request

## 2011-08-25 ENCOUNTER — Ambulatory Visit (INDEPENDENT_AMBULATORY_CARE_PROVIDER_SITE_OTHER): Payer: 59 | Admitting: *Deleted

## 2011-08-25 DIAGNOSIS — Z7901 Long term (current) use of anticoagulants: Secondary | ICD-10-CM

## 2011-08-25 DIAGNOSIS — Z8672 Personal history of thrombophlebitis: Secondary | ICD-10-CM

## 2011-08-25 LAB — POCT INR: INR: 1.4

## 2011-09-08 ENCOUNTER — Ambulatory Visit (INDEPENDENT_AMBULATORY_CARE_PROVIDER_SITE_OTHER): Payer: 59 | Admitting: *Deleted

## 2011-09-08 DIAGNOSIS — Z8672 Personal history of thrombophlebitis: Secondary | ICD-10-CM

## 2011-09-08 DIAGNOSIS — Z7901 Long term (current) use of anticoagulants: Secondary | ICD-10-CM

## 2011-09-08 LAB — POCT INR: INR: 2.6

## 2011-10-10 ENCOUNTER — Ambulatory Visit: Payer: 59

## 2011-10-17 ENCOUNTER — Ambulatory Visit (INDEPENDENT_AMBULATORY_CARE_PROVIDER_SITE_OTHER): Payer: 59 | Admitting: *Deleted

## 2011-10-17 DIAGNOSIS — Z8672 Personal history of thrombophlebitis: Secondary | ICD-10-CM

## 2011-10-17 DIAGNOSIS — Z7901 Long term (current) use of anticoagulants: Secondary | ICD-10-CM

## 2011-10-17 LAB — POCT INR: INR: 2.4

## 2011-11-21 ENCOUNTER — Ambulatory Visit (INDEPENDENT_AMBULATORY_CARE_PROVIDER_SITE_OTHER): Payer: Self-pay | Admitting: *Deleted

## 2011-11-21 DIAGNOSIS — Z7901 Long term (current) use of anticoagulants: Secondary | ICD-10-CM

## 2011-11-21 DIAGNOSIS — Z8672 Personal history of thrombophlebitis: Secondary | ICD-10-CM

## 2011-11-21 LAB — POCT INR: INR: 2.5

## 2011-12-06 ENCOUNTER — Encounter (HOSPITAL_COMMUNITY): Payer: Self-pay | Admitting: *Deleted

## 2011-12-06 ENCOUNTER — Emergency Department (INDEPENDENT_AMBULATORY_CARE_PROVIDER_SITE_OTHER)
Admission: EM | Admit: 2011-12-06 | Discharge: 2011-12-06 | Disposition: A | Discharge status: Home / Self Care | Payer: Self-pay | Source: Home / Self Care | Attending: Emergency Medicine | Admitting: Emergency Medicine

## 2011-12-06 DIAGNOSIS — R21 Rash and other nonspecific skin eruption: Secondary | ICD-10-CM

## 2011-12-06 DIAGNOSIS — L509 Urticaria, unspecified: Secondary | ICD-10-CM

## 2011-12-06 MED ORDER — PREDNISONE 10 MG PO TABS: 20.0000 mg | ORAL_TABLET | Freq: Every day | ORAL | Status: DC

## 2011-12-06 MED ORDER — HYDROXYZINE HCL 25 MG PO TABS: 25.0000 mg | ORAL_TABLET | Freq: Three times a day (TID) | ORAL | Status: AC | PRN

## 2011-12-06 MED ORDER — PERMETHRIN 5 % EX CREA: TOPICAL_CREAM | CUTANEOUS | Status: AC

## 2011-12-06 NOTE — ED Provider Notes (Signed)
History     CSN: [id number]  Arrival date & time 12/06/11  1609   First MD Initiated Contact with Patient 12/06/11 1613      Chief Complaint  Patient presents with  . Rash    (Consider location/radiation/quality/duration/timing/severity/associated sxs/prior treatment) HPI Comments: For about 4 days been having this rash, its itchy on my face my hands and legs, i was close to a family dog, Have had this rash on my fingers before,  Patient is a 56 y.o. female presenting with rash. The history is provided by the patient.  Rash  This is a new problem. The current episode started more than 2 days ago. The problem has not changed since onset.The problem is associated with nothing. There has been no fever. The rash is present on the trunk, face, left wrist and right arm. The pain is at a severity of 1/10. The pain is moderate. Associated symptoms include itching and pain. Pertinent negatives include no blisters and no weeping. The treatment provided no relief.    History reviewed. No pertinent past medical history.  Past Surgical History  Procedure Date  . Total knee arthroplasty   . Shoulder surgery     Family History  Problem Relation Age of Onset  . Cancer Mother   . Coronary artery disease Mother   . Cancer Father   . Coronary artery disease Father     History  Substance Use Topics  . Smoking status: Never Smoker   . Smokeless tobacco: Not on file  . Alcohol Use: Yes     rarely    OB History    Grav Para Term Preterm Abortions TAB SAB Ect Mult Living                  Review of Systems  Constitutional: Negative for fever and fatigue.  Cardiovascular: Negative for leg swelling.  Skin: Positive for itching and rash. Negative for color change.    Allergies  Cephalexin  Home Medications   Current Outpatient Rx  Name Route Sig Dispense Refill  . WARFARIN SODIUM 5 MG PO TABS  TAKE BY MOUTH AS DIRECTED 120 tablet 3  . CITALOPRAM HYDROBROMIDE 20 MG PO TABS Oral  Take 20 mg by mouth daily.      Marland Kitchen FLUCONAZOLE 150 MG PO TABS Oral Take 150 mg by mouth. Once weekly for 6 weeks     . HYDROCHLOROTHIAZIDE 25 MG PO TABS  TAKE 1 TABLET BY MOUTH EVERY DAY 90 tablet 1  . HYDROXYZINE HCL 25 MG PO TABS Oral Take 1 tablet (25 mg total) by mouth every 8 (eight) hours as needed for itching. 12 tablet 0  . PERMETHRIN 5 % EX CREA  Apply to affected area once leave on for 8 hours repeat in 14 days 60 g 0  . PREDNISONE 10 MG PO TABS Oral Take 2 tablets (20 mg total) by mouth daily. 15 tablet 0    BP 134/84  Pulse 92  Temp(Src) 98 F (36.7 C) (Oral)  Resp 18  SpO2 96%  Physical Exam  Nursing note and vitals reviewed. Constitutional: She appears well-developed and well-nourished.  Lymphadenopathy:    She has no cervical adenopathy.  Skin: Rash noted. No abrasion noted. There is erythema.       ED Course  Procedures (including critical care time)  Labs Reviewed - No data to display No results found.   1. Urticaria   2. Papular eruption       MDM  Pruritic  rash- also with a papular eruption interdigital and dorsal aspect of both hands        Jimmie Molly, MD 12/06/11 1610

## 2011-12-06 NOTE — ED Notes (Signed)
Onset 4 days ago itchy, red, blotchy  Rash on face, neck, arms, hands and legs.  She has been in contact with a family dog.

## 2011-12-19 ENCOUNTER — Ambulatory Visit (INDEPENDENT_AMBULATORY_CARE_PROVIDER_SITE_OTHER): Payer: Self-pay | Admitting: *Deleted

## 2011-12-19 DIAGNOSIS — Z7901 Long term (current) use of anticoagulants: Secondary | ICD-10-CM

## 2011-12-19 DIAGNOSIS — Z8672 Personal history of thrombophlebitis: Secondary | ICD-10-CM

## 2011-12-19 LAB — POCT INR: INR: 3.7

## 2012-01-02 ENCOUNTER — Ambulatory Visit: Payer: Self-pay

## 2012-01-09 ENCOUNTER — Ambulatory Visit (INDEPENDENT_AMBULATORY_CARE_PROVIDER_SITE_OTHER): Payer: Self-pay | Admitting: *Deleted

## 2012-01-09 DIAGNOSIS — Z8672 Personal history of thrombophlebitis: Secondary | ICD-10-CM

## 2012-01-09 DIAGNOSIS — Z7901 Long term (current) use of anticoagulants: Secondary | ICD-10-CM

## 2012-01-09 LAB — POCT INR: INR: 3.4

## 2012-01-23 ENCOUNTER — Ambulatory Visit (INDEPENDENT_AMBULATORY_CARE_PROVIDER_SITE_OTHER): Payer: Self-pay | Admitting: *Deleted

## 2012-01-23 DIAGNOSIS — Z7901 Long term (current) use of anticoagulants: Secondary | ICD-10-CM

## 2012-01-23 DIAGNOSIS — Z8672 Personal history of thrombophlebitis: Secondary | ICD-10-CM

## 2012-01-23 LAB — POCT INR: INR: 2.7

## 2012-02-06 ENCOUNTER — Telehealth: Payer: Self-pay | Admitting: Family Medicine

## 2012-02-06 NOTE — Telephone Encounter (Signed)
Patient is calling for a Referral for [facility], for a Mammogram.  She is hoping to not have to be seen first.

## 2012-02-07 ENCOUNTER — Other Ambulatory Visit: Payer: Self-pay | Admitting: Family Medicine

## 2012-02-07 DIAGNOSIS — Z1231 Encounter for screening mammogram for malignant neoplasm of breast: Secondary | ICD-10-CM

## 2012-02-07 NOTE — Telephone Encounter (Signed)
Appt for screening mammogram scheduled at the [facility] center for 02/09/12 at 3:15. Patient informed, expressed understanding.

## 2012-02-09 ENCOUNTER — Ambulatory Visit: Payer: Self-pay

## 2012-02-13 ENCOUNTER — Ambulatory Visit: Payer: Self-pay

## 2012-02-13 ENCOUNTER — Ambulatory Visit (INDEPENDENT_AMBULATORY_CARE_PROVIDER_SITE_OTHER): Payer: Self-pay | Admitting: *Deleted

## 2012-02-13 DIAGNOSIS — Z7901 Long term (current) use of anticoagulants: Secondary | ICD-10-CM

## 2012-02-13 DIAGNOSIS — Z8672 Personal history of thrombophlebitis: Secondary | ICD-10-CM

## 2012-02-27 ENCOUNTER — Ambulatory Visit (INDEPENDENT_AMBULATORY_CARE_PROVIDER_SITE_OTHER): Payer: BC Managed Care – PPO | Admitting: *Deleted

## 2012-02-27 ENCOUNTER — Ambulatory Visit
Admission: RE | Admit: 2012-02-27 | Discharge: 2012-02-27 | Disposition: A | Discharge status: Home / Self Care | Payer: BC Managed Care – PPO | Source: Ambulatory Visit | Attending: Family Medicine | Admitting: Family Medicine

## 2012-02-27 DIAGNOSIS — Z8672 Personal history of thrombophlebitis: Secondary | ICD-10-CM

## 2012-02-27 DIAGNOSIS — Z1231 Encounter for screening mammogram for malignant neoplasm of breast: Secondary | ICD-10-CM

## 2012-02-27 DIAGNOSIS — Z7901 Long term (current) use of anticoagulants: Secondary | ICD-10-CM

## 2012-02-28 ENCOUNTER — Other Ambulatory Visit (HOSPITAL_COMMUNITY): Payer: Self-pay | Admitting: Family Medicine

## 2012-03-12 ENCOUNTER — Ambulatory Visit (INDEPENDENT_AMBULATORY_CARE_PROVIDER_SITE_OTHER): Payer: BC Managed Care – PPO | Admitting: *Deleted

## 2012-03-12 DIAGNOSIS — Z7901 Long term (current) use of anticoagulants: Secondary | ICD-10-CM

## 2012-03-12 DIAGNOSIS — Z8672 Personal history of thrombophlebitis: Secondary | ICD-10-CM

## 2012-04-16 ENCOUNTER — Ambulatory Visit: Payer: BC Managed Care – PPO

## 2012-04-23 ENCOUNTER — Ambulatory Visit (INDEPENDENT_AMBULATORY_CARE_PROVIDER_SITE_OTHER): Payer: BC Managed Care – PPO | Admitting: *Deleted

## 2012-04-23 DIAGNOSIS — Z7901 Long term (current) use of anticoagulants: Secondary | ICD-10-CM

## 2012-04-23 DIAGNOSIS — Z8672 Personal history of thrombophlebitis: Secondary | ICD-10-CM

## 2012-05-21 ENCOUNTER — Ambulatory Visit (INDEPENDENT_AMBULATORY_CARE_PROVIDER_SITE_OTHER): Payer: BC Managed Care – PPO | Admitting: *Deleted

## 2012-05-21 DIAGNOSIS — Z8672 Personal history of thrombophlebitis: Secondary | ICD-10-CM

## 2012-05-21 DIAGNOSIS — Z7901 Long term (current) use of anticoagulants: Secondary | ICD-10-CM

## 2012-05-21 LAB — POCT INR: INR: 2.4

## 2012-06-18 ENCOUNTER — Ambulatory Visit (INDEPENDENT_AMBULATORY_CARE_PROVIDER_SITE_OTHER): Payer: BC Managed Care – PPO | Admitting: *Deleted

## 2012-06-18 DIAGNOSIS — Z7901 Long term (current) use of anticoagulants: Secondary | ICD-10-CM

## 2012-06-18 DIAGNOSIS — Z8672 Personal history of thrombophlebitis: Secondary | ICD-10-CM

## 2012-06-18 LAB — POCT INR: INR: 2.6

## 2012-07-23 ENCOUNTER — Ambulatory Visit (INDEPENDENT_AMBULATORY_CARE_PROVIDER_SITE_OTHER): Payer: BC Managed Care – PPO | Admitting: *Deleted

## 2012-07-23 DIAGNOSIS — Z7901 Long term (current) use of anticoagulants: Secondary | ICD-10-CM

## 2012-07-23 DIAGNOSIS — Z8672 Personal history of thrombophlebitis: Secondary | ICD-10-CM

## 2012-08-13 ENCOUNTER — Other Ambulatory Visit: Payer: Self-pay | Admitting: Family Medicine

## 2012-08-20 ENCOUNTER — Ambulatory Visit (INDEPENDENT_AMBULATORY_CARE_PROVIDER_SITE_OTHER): Payer: BC Managed Care – PPO | Admitting: *Deleted

## 2012-08-20 DIAGNOSIS — Z7901 Long term (current) use of anticoagulants: Secondary | ICD-10-CM

## 2012-08-20 DIAGNOSIS — Z8672 Personal history of thrombophlebitis: Secondary | ICD-10-CM

## 2012-08-20 LAB — POCT INR: INR: 2.5

## 2012-09-17 ENCOUNTER — Ambulatory Visit (INDEPENDENT_AMBULATORY_CARE_PROVIDER_SITE_OTHER): Payer: BC Managed Care – PPO | Admitting: *Deleted

## 2012-09-17 DIAGNOSIS — Z7901 Long term (current) use of anticoagulants: Secondary | ICD-10-CM

## 2012-09-17 DIAGNOSIS — Z8672 Personal history of thrombophlebitis: Secondary | ICD-10-CM

## 2012-09-17 LAB — POCT INR: INR: 2.8

## 2012-10-15 ENCOUNTER — Ambulatory Visit (INDEPENDENT_AMBULATORY_CARE_PROVIDER_SITE_OTHER): Payer: BC Managed Care – PPO | Admitting: *Deleted

## 2012-10-15 DIAGNOSIS — Z8672 Personal history of thrombophlebitis: Secondary | ICD-10-CM

## 2012-10-15 DIAGNOSIS — Z7901 Long term (current) use of anticoagulants: Secondary | ICD-10-CM

## 2012-10-15 LAB — POCT INR: INR: 2.5

## 2012-11-12 ENCOUNTER — Ambulatory Visit (INDEPENDENT_AMBULATORY_CARE_PROVIDER_SITE_OTHER): Payer: BC Managed Care – PPO | Admitting: *Deleted

## 2012-11-12 DIAGNOSIS — Z8672 Personal history of thrombophlebitis: Secondary | ICD-10-CM

## 2012-11-12 DIAGNOSIS — Z7901 Long term (current) use of anticoagulants: Secondary | ICD-10-CM

## 2012-12-10 ENCOUNTER — Ambulatory Visit (INDEPENDENT_AMBULATORY_CARE_PROVIDER_SITE_OTHER): Payer: BC Managed Care – PPO | Admitting: *Deleted

## 2012-12-10 DIAGNOSIS — Z7901 Long term (current) use of anticoagulants: Secondary | ICD-10-CM

## 2012-12-10 DIAGNOSIS — Z8672 Personal history of thrombophlebitis: Secondary | ICD-10-CM

## 2012-12-10 LAB — POCT INR: INR: 1.3

## 2012-12-24 ENCOUNTER — Ambulatory Visit: Payer: BC Managed Care – PPO

## 2013-01-14 ENCOUNTER — Ambulatory Visit (INDEPENDENT_AMBULATORY_CARE_PROVIDER_SITE_OTHER): Payer: BC Managed Care – PPO | Admitting: *Deleted

## 2013-01-14 DIAGNOSIS — Z7901 Long term (current) use of anticoagulants: Secondary | ICD-10-CM

## 2013-01-14 DIAGNOSIS — Z8672 Personal history of thrombophlebitis: Secondary | ICD-10-CM

## 2013-01-14 LAB — POCT INR: INR: 1.7

## 2013-01-28 ENCOUNTER — Ambulatory Visit: Payer: BC Managed Care – PPO

## 2013-02-04 ENCOUNTER — Ambulatory Visit (INDEPENDENT_AMBULATORY_CARE_PROVIDER_SITE_OTHER): Payer: BC Managed Care – PPO | Admitting: *Deleted

## 2013-02-04 DIAGNOSIS — Z7901 Long term (current) use of anticoagulants: Secondary | ICD-10-CM

## 2013-02-04 DIAGNOSIS — Z8672 Personal history of thrombophlebitis: Secondary | ICD-10-CM

## 2013-03-04 ENCOUNTER — Encounter: Payer: Self-pay | Admitting: *Deleted

## 2013-03-04 ENCOUNTER — Ambulatory Visit (INDEPENDENT_AMBULATORY_CARE_PROVIDER_SITE_OTHER): Payer: BC Managed Care – PPO | Admitting: *Deleted

## 2013-03-04 DIAGNOSIS — Z8672 Personal history of thrombophlebitis: Secondary | ICD-10-CM

## 2013-03-04 DIAGNOSIS — Z7901 Long term (current) use of anticoagulants: Secondary | ICD-10-CM

## 2013-04-01 ENCOUNTER — Ambulatory Visit: Payer: BC Managed Care – PPO

## 2013-04-04 ENCOUNTER — Encounter (HOSPITAL_COMMUNITY): Payer: Self-pay | Admitting: *Deleted

## 2013-04-04 ENCOUNTER — Emergency Department (HOSPITAL_COMMUNITY)
Admission: EM | Admit: 2013-04-04 | Discharge: 2013-04-04 | Disposition: A | Discharge status: Home / Self Care | Payer: Worker's Compensation | Attending: Emergency Medicine | Admitting: Emergency Medicine

## 2013-04-04 DIAGNOSIS — IMO0002 Reserved for concepts with insufficient information to code with codable children: Secondary | ICD-10-CM | POA: Insufficient documentation

## 2013-04-04 DIAGNOSIS — Y990 Civilian activity done for income or pay: Secondary | ICD-10-CM | POA: Insufficient documentation

## 2013-04-04 DIAGNOSIS — Z23 Encounter for immunization: Secondary | ICD-10-CM | POA: Insufficient documentation

## 2013-04-04 DIAGNOSIS — L0390 Cellulitis, unspecified: Secondary | ICD-10-CM

## 2013-04-04 DIAGNOSIS — T22119A Burn of first degree of unspecified forearm, initial encounter: Secondary | ICD-10-CM | POA: Insufficient documentation

## 2013-04-04 DIAGNOSIS — L299 Pruritus, unspecified: Secondary | ICD-10-CM | POA: Insufficient documentation

## 2013-04-04 DIAGNOSIS — T300 Burn of unspecified body region, unspecified degree: Secondary | ICD-10-CM

## 2013-04-04 DIAGNOSIS — X12XXXA Contact with other hot fluids, initial encounter: Secondary | ICD-10-CM | POA: Insufficient documentation

## 2013-04-04 DIAGNOSIS — Y9389 Activity, other specified: Secondary | ICD-10-CM | POA: Insufficient documentation

## 2013-04-04 DIAGNOSIS — Y9289 Other specified places as the place of occurrence of the external cause: Secondary | ICD-10-CM | POA: Insufficient documentation

## 2013-04-04 MED ORDER — SULFAMETHOXAZOLE-TRIMETHOPRIM 800-160 MG PO TABS: 1.0000 | ORAL_TABLET | Freq: Two times a day (BID) | ORAL | Status: DC

## 2013-04-04 MED ORDER — TETANUS-DIPHTH-ACELL PERTUSSIS 5-2.5-18.5 LF-MCG/0.5 IM SUSP
0.5000 mL | Freq: Once | INTRAMUSCULAR | Status: AC
  Administered 2013-04-04: 0.5 mL via INTRAMUSCULAR
  Filled 2013-04-04: qty 0.5

## 2013-04-04 MED ORDER — SILVER SULFADIAZINE 1 % EX CREA
TOPICAL_CREAM | Freq: Once | CUTANEOUS | Status: AC
  Administered 2013-04-04: 09:00:00 via TOPICAL
  Filled 2013-04-04: qty 50
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
## 2013-04-04 NOTE — ED Provider Notes (Signed)
  Medical screening examination/treatment/procedure(s) were performed by non-physician practitioner and as supervising physician I was immediately available for consultation/collaboration.    Gerhard Munch, MD 04/04/13 1032

## 2013-04-04 NOTE — Progress Notes (Signed)
[facility] has seen patient and provided her with a list of Primary Care Resources. Patient stated that her Workers Comp would cover her hospital bill.

## 2013-04-06 ENCOUNTER — Encounter (HOSPITAL_COMMUNITY): Payer: Self-pay | Admitting: Emergency Medicine

## 2013-04-06 ENCOUNTER — Emergency Department (HOSPITAL_COMMUNITY)
Admission: EM | Admit: 2013-04-06 | Discharge: 2013-04-06 | Disposition: A | Discharge status: Home / Self Care | Payer: Worker's Compensation | Attending: Emergency Medicine | Admitting: Emergency Medicine

## 2013-04-06 DIAGNOSIS — Z79899 Other long term (current) drug therapy: Secondary | ICD-10-CM | POA: Insufficient documentation

## 2013-04-06 DIAGNOSIS — Z880 Allergy status to penicillin: Secondary | ICD-10-CM | POA: Insufficient documentation

## 2013-04-06 DIAGNOSIS — Z5189 Encounter for other specified aftercare: Secondary | ICD-10-CM

## 2013-04-06 DIAGNOSIS — Z4800 Encounter for change or removal of nonsurgical wound dressing: Secondary | ICD-10-CM | POA: Insufficient documentation

## 2013-04-06 MED ORDER — GABAPENTIN 300 MG PO CAPS: 300.0000 mg | ORAL_CAPSULE | Freq: Three times a day (TID) | ORAL | Status: DC

## 2013-04-06 NOTE — ED Provider Notes (Signed)
Medical screening examination/treatment/procedure(s) were performed by non-physician practitioner and as supervising physician I was immediately available for consultation/collaboration.   Dione Booze, MD 04/06/13 1626
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
## 2013-04-09 ENCOUNTER — Ambulatory Visit (INDEPENDENT_AMBULATORY_CARE_PROVIDER_SITE_OTHER): Payer: Worker's Compensation | Admitting: Family Medicine

## 2013-04-09 ENCOUNTER — Encounter: Payer: Self-pay | Admitting: Family Medicine

## 2013-04-09 ENCOUNTER — Ambulatory Visit (INDEPENDENT_AMBULATORY_CARE_PROVIDER_SITE_OTHER): Payer: BC Managed Care – PPO | Admitting: *Deleted

## 2013-04-09 VITALS — BP 142/72 | HR 80 | Temp 98.4°F | Ht 66.0 in | Wt 240.0 lb

## 2013-04-09 DIAGNOSIS — Z8672 Personal history of thrombophlebitis: Secondary | ICD-10-CM

## 2013-04-09 DIAGNOSIS — IMO0002 Reserved for concepts with insufficient information to code with codable children: Secondary | ICD-10-CM

## 2013-04-09 DIAGNOSIS — Z7901 Long term (current) use of anticoagulants: Secondary | ICD-10-CM

## 2013-04-09 LAB — POCT INR: INR: 3.9
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
## 2013-04-09 NOTE — Progress Notes (Signed)
To hold Coumadin for 2 days continue current dose and recheck INR in 1 wk.
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
## 2013-04-09 NOTE — Patient Instructions (Addendum)
Burn Care °Burns hurt your skin. When your skin is hurt, it is easier to get an infection. Follow your doctor's directions to help prevent an infection. °HOME CARE °· Wash your hands well before you change your bandage. °· Change your bandage as often as told by your doctor. °· Remove the old bandage. If the bandage sticks, soak it off with cool, clean water. °· Gently clean the burn with mild soap and water. °· Pat the burn dry with a clean, dry cloth. °· Put a thin layer of medicated cream on the burn. °· Put a clean bandage on as told by your doctor. °· Keep the bandage clean and dry. °· Raise (elevate) the burn for the first 24 hours. After that, follow your doctor's directions. °· Only take medicine as told by your doctor. °GET HELP RIGHT AWAY IF:  °· You have too much pain. °· The skin near the burn is red, tender, puffy (swollen), or has red streaks. °· The burn area has yellowish white fluid (pus) or a bad smell coming from it. °· You have a fever. °MAKE SURE YOU:  °· Understand these instructions. °· Will watch your condition. °· Will get help right away if you are not doing well or get worse. °Document Released: 08/09/2008 Document Revised: 01/23/2012 Document Reviewed: 03/23/2011 °[employer]® Patient Information ©2014 [employer]. ° °

## 2013-04-16 ENCOUNTER — Encounter: Payer: Self-pay | Admitting: Family Medicine

## 2013-04-16 ENCOUNTER — Ambulatory Visit (INDEPENDENT_AMBULATORY_CARE_PROVIDER_SITE_OTHER): Payer: Worker's Compensation | Admitting: Family Medicine

## 2013-04-16 ENCOUNTER — Ambulatory Visit (INDEPENDENT_AMBULATORY_CARE_PROVIDER_SITE_OTHER): Payer: BC Managed Care – PPO | Admitting: *Deleted

## 2013-04-16 VITALS — BP 151/77 | HR 72 | Temp 98.2°F | Ht 66.0 in | Wt 239.0 lb

## 2013-04-16 DIAGNOSIS — Z8672 Personal history of thrombophlebitis: Secondary | ICD-10-CM

## 2013-04-16 DIAGNOSIS — Z7901 Long term (current) use of anticoagulants: Secondary | ICD-10-CM

## 2013-04-16 DIAGNOSIS — IMO0002 Reserved for concepts with insufficient information to code with codable children: Secondary | ICD-10-CM

## 2013-04-16 LAB — POCT INR: INR: 1.9
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
## 2013-05-06 ENCOUNTER — Ambulatory Visit (INDEPENDENT_AMBULATORY_CARE_PROVIDER_SITE_OTHER): Payer: BC Managed Care – PPO | Admitting: *Deleted

## 2013-05-06 DIAGNOSIS — Z8672 Personal history of thrombophlebitis: Secondary | ICD-10-CM

## 2013-05-06 DIAGNOSIS — Z7901 Long term (current) use of anticoagulants: Secondary | ICD-10-CM

## 2013-06-10 ENCOUNTER — Ambulatory Visit (INDEPENDENT_AMBULATORY_CARE_PROVIDER_SITE_OTHER): Payer: BC Managed Care – PPO | Admitting: *Deleted

## 2013-06-10 DIAGNOSIS — Z8672 Personal history of thrombophlebitis: Secondary | ICD-10-CM

## 2013-06-10 DIAGNOSIS — Z7901 Long term (current) use of anticoagulants: Secondary | ICD-10-CM

## 2013-06-10 LAB — POCT INR: INR: 2.6

## 2013-07-04 ENCOUNTER — Other Ambulatory Visit: Payer: Self-pay | Admitting: Family Medicine

## 2013-07-08 ENCOUNTER — Ambulatory Visit (INDEPENDENT_AMBULATORY_CARE_PROVIDER_SITE_OTHER): Payer: BC Managed Care – PPO | Admitting: *Deleted

## 2013-07-08 DIAGNOSIS — Z7901 Long term (current) use of anticoagulants: Secondary | ICD-10-CM

## 2013-07-08 DIAGNOSIS — Z8672 Personal history of thrombophlebitis: Secondary | ICD-10-CM

## 2013-07-08 LAB — POCT INR: INR: 3.2

## 2013-08-05 ENCOUNTER — Ambulatory Visit (INDEPENDENT_AMBULATORY_CARE_PROVIDER_SITE_OTHER): Payer: BC Managed Care – PPO | Admitting: *Deleted

## 2013-08-05 DIAGNOSIS — Z7901 Long term (current) use of anticoagulants: Secondary | ICD-10-CM

## 2013-08-05 DIAGNOSIS — Z8672 Personal history of thrombophlebitis: Secondary | ICD-10-CM

## 2013-09-02 ENCOUNTER — Ambulatory Visit: Payer: Self-pay

## 2013-09-16 ENCOUNTER — Ambulatory Visit (INDEPENDENT_AMBULATORY_CARE_PROVIDER_SITE_OTHER): Payer: BC Managed Care – PPO | Admitting: *Deleted

## 2013-09-16 DIAGNOSIS — Z7901 Long term (current) use of anticoagulants: Secondary | ICD-10-CM

## 2013-09-16 DIAGNOSIS — Z8672 Personal history of thrombophlebitis: Secondary | ICD-10-CM

## 2013-10-14 ENCOUNTER — Ambulatory Visit (INDEPENDENT_AMBULATORY_CARE_PROVIDER_SITE_OTHER): Payer: BC Managed Care – PPO | Admitting: *Deleted

## 2013-10-14 DIAGNOSIS — Z7901 Long term (current) use of anticoagulants: Secondary | ICD-10-CM

## 2013-10-14 DIAGNOSIS — Z8672 Personal history of thrombophlebitis: Secondary | ICD-10-CM

## 2013-10-21 ENCOUNTER — Other Ambulatory Visit: Payer: Self-pay | Admitting: Family Medicine

## 2013-11-11 ENCOUNTER — Ambulatory Visit (INDEPENDENT_AMBULATORY_CARE_PROVIDER_SITE_OTHER): Payer: BC Managed Care – PPO | Admitting: *Deleted

## 2013-11-11 DIAGNOSIS — Z7901 Long term (current) use of anticoagulants: Secondary | ICD-10-CM

## 2013-11-11 DIAGNOSIS — Z8672 Personal history of thrombophlebitis: Secondary | ICD-10-CM

## 2013-12-09 ENCOUNTER — Ambulatory Visit: Payer: Self-pay

## 2013-12-16 ENCOUNTER — Ambulatory Visit (INDEPENDENT_AMBULATORY_CARE_PROVIDER_SITE_OTHER): Payer: BC Managed Care – PPO | Admitting: *Deleted

## 2013-12-16 DIAGNOSIS — Z8672 Personal history of thrombophlebitis: Secondary | ICD-10-CM

## 2013-12-16 DIAGNOSIS — Z7901 Long term (current) use of anticoagulants: Secondary | ICD-10-CM

## 2013-12-16 LAB — POCT INR: INR: 2.4

## 2014-01-01 ENCOUNTER — Ambulatory Visit (INDEPENDENT_AMBULATORY_CARE_PROVIDER_SITE_OTHER): Payer: BC Managed Care – PPO | Admitting: Family Medicine

## 2014-01-01 ENCOUNTER — Encounter: Payer: Self-pay | Admitting: Family Medicine

## 2014-01-01 VITALS — BP 157/86 | HR 58 | Ht 66.0 in | Wt 247.0 lb

## 2014-01-01 DIAGNOSIS — I10 Essential (primary) hypertension: Secondary | ICD-10-CM

## 2014-01-01 DIAGNOSIS — R5381 Other malaise: Secondary | ICD-10-CM

## 2014-01-01 DIAGNOSIS — R5383 Other fatigue: Secondary | ICD-10-CM

## 2014-01-01 DIAGNOSIS — R21 Rash and other nonspecific skin eruption: Secondary | ICD-10-CM

## 2014-01-01 LAB — LIPID PANEL
CHOL/HDL RATIO: 4.5 ratio
Cholesterol: 207 mg/dL — ABNORMAL HIGH (ref 0–200)
HDL: 46 mg/dL (ref >39)
LDL CALC: 124 mg/dL — AB (ref 0–99)
Triglycerides: 184 mg/dL — ABNORMAL HIGH (ref <150)
VLDL: 37 mg/dL (ref 0–40)

## 2014-01-01 LAB — BASIC METABOLIC PANEL
BUN: 15 mg/dL (ref 6–23)
CALCIUM: 9.6 mg/dL (ref 8.4–10.5)
CHLORIDE: 103 meq/L (ref 96–112)
CO2: 30 meq/L (ref 19–32)
Creat: 0.61 mg/dL (ref 0.50–1.10)
Glucose, Bld: 111 mg/dL — ABNORMAL HIGH (ref 70–99)
Potassium: 4.1 mEq/L (ref 3.5–5.3)
SODIUM: 140 meq/L (ref 135–145)

## 2014-01-01 MED ORDER — KETOCONAZOLE 2 % EX CREA: 1.0000 "application " | TOPICAL_CREAM | Freq: Every day | CUTANEOUS | Status: DC

## 2014-01-01 MED ORDER — WARFARIN SODIUM 5 MG PO TABS: ORAL_TABLET | ORAL | Status: DC

## 2014-01-01 MED ORDER — HYDROCHLOROTHIAZIDE 25 MG PO TABS: ORAL_TABLET | ORAL | Status: DC

## 2014-01-01 NOTE — Progress Notes (Signed)
Patient ID: Michele Morrison, female   DOB: [birth year], 58 y.o.   MRN: [id number]    Subjective: HPI: Patient is a 58 y.o. female presenting to clinic today for follow up appointment. Concerns today include wanting thyroid check and rash on hand. Needs routine f/u for HTN.  1. Thyroid- Strong family history of thyroid issues. Last check was in her 21's and it was normal. Mom had thyroid cancer. She states she has difficulty losing weight, hair thinning. Reports some palpitations. She reports insomnia for a few years. Some drier skin that normal. Stressed at work. Denies lumps in throat. Endorses fatigue.  2. Hypertension Blood pressure at home: Does not check Blood pressure today: 157/86 Taking Meds: HCTZ Side effects: No side effects ROS: Denies persistent headache, nausea, vomiting, chest pain, abdominal pain or shortness of breath.  3. Rash- Patient has a few areas of red skin on back of left hand and elbows. Says she had sensitive skin and had major reaction to dog's shampoo in the past. Thinks this is either from the dog or ringworm. She has been given creams in the past that did not work. She wants it all to "go away." Not itchy, not changing, not raised.  Health maintenance On Warfarin, INR at goal. Does not do well off of the medication. Does not want CBC checked because she "is not anemic." Does not want colonscopy Does not want pap smear   History reviewed: Non-smoker   ROS: Please see HPI above.  Objective: Office vital signs reviewed. Ht 5\' 6"  (1.676 m)  Wt 247 lb (112.038 kg)  BMI 39.89 kg/m2  Physical Examination:  General: Awake, alert. NAD. Short replies to questions. HEENT: Atraumatic, normocephalic Neck: No masses palpated. No LAD. Thyroid not enlarged Pulm: CTAB, no wheezes Cardio: RRR, no murmurs appreciated Skin: 2cm flat erythematous macule below 4th finger on back of hand, not dry or raised. Some erythematous dry skin on elbows with some  excoriations Neuro: Grossly intact  Assessment: 58 y.o. female follow up  Plan: See Problem List and After Visit Summary

## 2014-01-01 NOTE — Patient Instructions (Signed)
It was nice to meet you today.   I will check your labs today and I will call you if anything is abnormal.  We should check your blood pressure again in 6 months.  Denielle Bayard M. Raiquan Chandler, M.D.

## 2014-01-01 NOTE — Assessment & Plan Note (Signed)
I think this is local irritation and will likely resolve with moisturizer and hydrocortisone cream. Patient feels this is ringworm based on round appearance. Given reassurance that this is likely self-limiting but will give antifungal a 2 week trial. F/u prn.

## 2014-01-01 NOTE — Assessment & Plan Note (Signed)
BP slightly above goal at triage but she declined recheck. Happy on HCTZ and not interested in a different medication. Bmet and lipid panel today. Con't HCTZ, f/u in 6 months.

## 2014-01-01 NOTE — Assessment & Plan Note (Signed)
Will check thyroid. Hair loss could be from age, and fatigue from stress but will confirm that with TSH.

## 2014-01-02 LAB — TSH: TSH: 1.035 u[IU]/mL (ref 0.350–4.500)

## 2014-01-03 ENCOUNTER — Telehealth: Payer: Self-pay | Admitting: Family Medicine

## 2014-01-03 NOTE — Telephone Encounter (Signed)
Can you please let Ms. Piccininni know that her thyroid was normal. However, her cholesterol was elevated. I know she is very hesitant to start a medication, but I do recommend she take a statin every day to reduce the risk heart problems given her elevated blood pressure. If she is interested in the medication, i will call it in.  Thank you, Astraea Gaughran M. Elliot Meldrum, M.D.

## 2014-01-03 NOTE — Telephone Encounter (Signed)
LM for pt to call back.  Please give message from Dr. Sheral Apley.  Thanks [employer]

## 2014-01-13 ENCOUNTER — Ambulatory Visit (INDEPENDENT_AMBULATORY_CARE_PROVIDER_SITE_OTHER): Payer: BC Managed Care – PPO | Admitting: *Deleted

## 2014-01-13 DIAGNOSIS — Z7901 Long term (current) use of anticoagulants: Secondary | ICD-10-CM

## 2014-01-13 DIAGNOSIS — Z8672 Personal history of thrombophlebitis: Secondary | ICD-10-CM

## 2014-01-13 LAB — POCT INR: INR: 2

## 2014-02-10 ENCOUNTER — Ambulatory Visit (INDEPENDENT_AMBULATORY_CARE_PROVIDER_SITE_OTHER): Payer: BC Managed Care – PPO | Admitting: *Deleted

## 2014-02-10 DIAGNOSIS — Z8672 Personal history of thrombophlebitis: Secondary | ICD-10-CM

## 2014-02-10 DIAGNOSIS — Z7901 Long term (current) use of anticoagulants: Secondary | ICD-10-CM

## 2014-02-10 LAB — POCT INR: INR: 3.3

## 2014-02-24 ENCOUNTER — Ambulatory Visit (INDEPENDENT_AMBULATORY_CARE_PROVIDER_SITE_OTHER): Payer: Self-pay | Admitting: *Deleted

## 2014-02-24 DIAGNOSIS — Z7901 Long term (current) use of anticoagulants: Secondary | ICD-10-CM

## 2014-02-24 DIAGNOSIS — Z8672 Personal history of thrombophlebitis: Secondary | ICD-10-CM

## 2014-02-24 LAB — POCT INR: INR: 2

## 2014-02-25 ENCOUNTER — Ambulatory Visit: Payer: Self-pay

## 2014-03-22 ENCOUNTER — Encounter (HOSPITAL_COMMUNITY): Payer: Self-pay | Admitting: Emergency Medicine

## 2014-03-22 ENCOUNTER — Emergency Department (HOSPITAL_COMMUNITY)
Admission: EM | Admit: 2014-03-22 | Discharge: 2014-03-22 | Disposition: A | Discharge status: Home / Self Care | Payer: Self-pay | Source: Home / Self Care | Attending: Emergency Medicine | Admitting: Emergency Medicine

## 2014-03-22 ENCOUNTER — Emergency Department (INDEPENDENT_AMBULATORY_CARE_PROVIDER_SITE_OTHER): Payer: Self-pay

## 2014-03-22 DIAGNOSIS — M719 Bursopathy, unspecified: Secondary | ICD-10-CM

## 2014-03-22 DIAGNOSIS — M67919 Unspecified disorder of synovium and tendon, unspecified shoulder: Secondary | ICD-10-CM

## 2014-03-22 DIAGNOSIS — M7580 Other shoulder lesions, unspecified shoulder: Secondary | ICD-10-CM

## 2014-03-22 HISTORY — DX: Personal history of other venous thrombosis and embolism: Z86.718

## 2014-03-22 MED ORDER — PREDNISONE 20 MG PO TABS: ORAL_TABLET | ORAL | Status: DC

## 2014-03-22 MED ORDER — HYDROCODONE-ACETAMINOPHEN 5-325 MG PO TABS
2.0000 | ORAL_TABLET | Freq: Once | ORAL | Status: AC
  Administered 2014-03-22: 2 via ORAL

## 2014-03-22 MED ORDER — HYDROCODONE-ACETAMINOPHEN 5-325 MG PO TABS
ORAL_TABLET | ORAL | Status: AC
  Filled 2014-03-22: qty 2

## 2014-03-22 MED ORDER — OXYCODONE-ACETAMINOPHEN 5-325 MG PO TABS: ORAL_TABLET | ORAL | Status: DC

## 2014-03-22 NOTE — Discharge Instructions (Signed)

## 2014-03-22 NOTE — ED Notes (Signed)
Pt  Reports  Symptoms  Of  l  Shoulder   Pain  Since  Last  Pm   denys  Any  specefic  Injury         rom is  Decreased

## 2014-03-22 NOTE — ED Provider Notes (Signed)
Chief Complaint   Chief Complaint  Patient presents with  . Shoulder Pain    History of Present Illness   Michele Morrison is a 58 year old female who woke up from sleep last night with severe pain in her left shoulder. This was located in the top of the shoulder. She denies any injury, although she didn't clean the carpet the day before the shoulder pain began. She has pain with any movement. Denies any pain in her neck or her chest. No pain radiating down her arm. No numbness, tingling, or weakness in the arm.  Review of Systems   Other than as noted above, the patient denies any of the following symptoms: Systemic:  No fevers or chills. Musculoskeletal:  No joint pain, arthritis, swelling, back pain, or neck pain. No history of arthritis.  Neurological:  No muscular weakness or paresthesia.  [place]   Past medical history, family history, social history, meds, and allergies were reviewed.  She is allergic to cephalexin penicillins. She takes hydrochlorothiazide and Coumadin. She has a history of DVT in her legs.  Physical Examination     Vital signs:  BP 152/74  Pulse 61  Temp(Src) 97.9 F (36.6 C) (Oral)  Resp 19  SpO2 100% Gen:  Alert and oriented times 3.  In no distress. Musculoskeletal: Exam of the shoulder reveals exquisite tenderness to palpation anteriorly, posteriorly, superiorly, and laterally. There is no swelling or bruising. She has almost 0 range of motion of her shoulder due to pain and unable to perform Neer test, Hawkins test, and empty cans test. Otherwise, all joints had a full a ROM with no swelling, bruising or deformity.  No edema, pulses full. Extremities were warm and pink.  Capillary refill was brisk.  Skin:  Clear, warm and dry.  No rash. Neuro:  Alert and oriented times 3.  Muscle strength was normal.  Sensation was intact to light touch.   Radiology   Dg Shoulder Left  03/22/2014   CLINICAL DATA:  Left shoulder pain, no known injury  EXAM: LEFT  SHOULDER - 2+ VIEW  COMPARISON:  None  FINDINGS: No acute fracture or malalignment. No glenohumeral joint osteoarthritis. There is degenerative change at the acromioclavicular joint. No lytic or blastic osseous lesion. The visualized thorax  IMPRESSION: No acute fracture or malalignment.  Mild degenerative change at the acromioclavicular joint.   Electronically Signed   By: Jacqulynn Cadet M.D.   On: 03/22/2014 11:50   I reviewed the images independently and personally and concur with the radiologist's findings.  Assessment   The encounter diagnosis was Rotator cuff tendonitis.  Plan     1.  Meds:  The following meds were prescribed:   Discharge Medication List as of 03/22/2014 12:09 PM    START taking these medications   Details  oxyCODONE-acetaminophen (PERCOCET) 5-325 MG per tablet 1 to 2 tablets every 6 hours as needed for pain., Print    predniSONE (DELTASONE) 20 MG tablet Take 3 daily for 5 days, 2 daily for 5 days, 1 daily for 5 days., Normal        2.  Patient Education/Counseling:  The patient was given appropriate handouts, self care instructions, and instructed in symptomatic relief.  Advised to wear a sling which she has at home, apply ice, and followup with Dr. Melrose Nakayama, her orthopedist next week.  3.  Follow up:  The patient was told to follow up here if no better in 3 to 4 days, or sooner if becoming  worse in any way, and given some red flag symptoms such as worsening pain or new neurological symptoms which would prompt immediate return.       Harden Mo, MD 03/22/14 [phone]

## 2014-03-24 ENCOUNTER — Ambulatory Visit (INDEPENDENT_AMBULATORY_CARE_PROVIDER_SITE_OTHER): Payer: Self-pay | Admitting: *Deleted

## 2014-03-24 DIAGNOSIS — I82409 Acute embolism and thrombosis of unspecified deep veins of unspecified lower extremity: Secondary | ICD-10-CM

## 2014-03-24 DIAGNOSIS — Z7901 Long term (current) use of anticoagulants: Secondary | ICD-10-CM

## 2014-03-24 LAB — POCT INR: INR: 3.5

## 2014-04-08 ENCOUNTER — Ambulatory Visit: Payer: Self-pay

## 2014-05-19 ENCOUNTER — Ambulatory Visit (INDEPENDENT_AMBULATORY_CARE_PROVIDER_SITE_OTHER): Payer: Self-pay | Admitting: *Deleted

## 2014-05-19 DIAGNOSIS — Z7901 Long term (current) use of anticoagulants: Secondary | ICD-10-CM

## 2014-05-19 DIAGNOSIS — M67919 Unspecified disorder of synovium and tendon, unspecified shoulder: Secondary | ICD-10-CM

## 2014-05-19 DIAGNOSIS — M719 Bursopathy, unspecified: Secondary | ICD-10-CM

## 2014-05-19 DIAGNOSIS — I82409 Acute embolism and thrombosis of unspecified deep veins of unspecified lower extremity: Secondary | ICD-10-CM

## 2014-05-19 LAB — POCT INR: INR: 1.7

## 2014-05-26 ENCOUNTER — Ambulatory Visit (INDEPENDENT_AMBULATORY_CARE_PROVIDER_SITE_OTHER): Payer: Self-pay | Admitting: Family Medicine

## 2014-05-26 ENCOUNTER — Encounter: Payer: Self-pay | Admitting: Family Medicine

## 2014-05-26 VITALS — BP 153/72 | HR 52 | Temp 98.1°F | Ht 66.0 in | Wt 253.2 lb

## 2014-05-26 DIAGNOSIS — L97311 Non-pressure chronic ulcer of right ankle limited to breakdown of skin: Secondary | ICD-10-CM

## 2014-05-26 DIAGNOSIS — L97309 Non-pressure chronic ulcer of unspecified ankle with unspecified severity: Secondary | ICD-10-CM

## 2014-05-26 NOTE — Progress Notes (Signed)
   Subjective:    Patient ID: Michele Morrison, female    DOB: 1956-10-22, 58 y.o.   MRN: [id number]  HPI [name] is here for non healing ulcer. 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 On Coumadin chronically.  She has had a ulcer on her medial malleolus for about a month. She has a healed ulcer on her medial leg from chronic venous insufficiency. She had no trauma at the initiation of this ulcer. She denies any redness or streaking. There is no discharge or oozing. It is painful when ulcer becomes dry. She has been placing vitamin E cream and Vaseline on the site and that has improved her symptoms. She does not have diabetes .   Current Outpatient Prescriptions on File Prior to Visit  Medication Sig Dispense Refill  . hydrochlorothiazide (HYDRODIURIL) 25 MG tablet TAKE 1 TABLET BY MOUTH ONCE DAILY  90 tablet  2  . warfarin (COUMADIN) 5 MG tablet TAKE BY MOUTH AS DIRECTED  120 tablet  3   No current facility-administered medications on file prior to visit.    Review of Systems See HPI    Objective:   Physical Exam BP 153/72  Pulse 52  Temp(Src) 98.1 F (36.7 C) (Oral)  Ht 5\' 6"  (1.676 m)  Wt 253 lb 3.2 oz (114.851 kg)  BMI 40.89 kg/m2 Gen: NAD, alert, cooperative with exam, well-appearing Skin: Chronic skin changes on medical left lower leg, nonhealing annular ulcer on medial malleolus, ulcer the size of a nickel, no erythema or streaking  Ext: pulses intact b/l LE      Assessment & Plan:

## 2014-05-26 NOTE — Patient Instructions (Signed)
Thank you for coming in,   Leave the wrap on there for three days. Please keep it dry. Place it with the other wrap and leave on for another three days.   Please follow up in one week and we'll determine if our plan of action if working or if we need to try something else.    Please feel free to call with any questions or concerns at any time, at [phone]. --Dr. Raeford Razor

## 2014-05-27 ENCOUNTER — Encounter: Payer: Self-pay | Admitting: Family Medicine

## 2014-05-27 DIAGNOSIS — L97309 Non-pressure chronic ulcer of unspecified ankle with unspecified severity: Secondary | ICD-10-CM | POA: Insufficient documentation

## 2014-05-27 HISTORY — DX: Non-pressure chronic ulcer of unspecified ankle with unspecified severity: L97.309

## 2014-05-27 NOTE — Assessment & Plan Note (Signed)
History of DVT in right leg. No signs of DVT currently. Healed ulcer on right medial LE as well as skin changes associated with chronic venous insufficiency. Ulcer on right medial malleolus. Pulse intact below ulcer. No hx of diabetes.  - duoderm patch applied - patch will last for 3 days. Then provided another patch to place - keep dry - f/u in 7 days to see improvement  - patient without insurance  - discussed with Dr. Gwendlyn Deutscher.

## 2014-06-02 ENCOUNTER — Ambulatory Visit: Payer: Self-pay

## 2014-06-03 ENCOUNTER — Ambulatory Visit (INDEPENDENT_AMBULATORY_CARE_PROVIDER_SITE_OTHER): Payer: Self-pay | Admitting: Family Medicine

## 2014-06-03 ENCOUNTER — Encounter: Payer: Self-pay | Admitting: Family Medicine

## 2014-06-03 ENCOUNTER — Ambulatory Visit (INDEPENDENT_AMBULATORY_CARE_PROVIDER_SITE_OTHER): Payer: Self-pay | Admitting: *Deleted

## 2014-06-03 VITALS — BP 141/74 | HR 80 | Temp 98.1°F | Ht 66.0 in | Wt 257.0 lb

## 2014-06-03 DIAGNOSIS — M25579 Pain in unspecified ankle and joints of unspecified foot: Secondary | ICD-10-CM

## 2014-06-03 DIAGNOSIS — M719 Bursopathy, unspecified: Secondary | ICD-10-CM

## 2014-06-03 DIAGNOSIS — L97309 Non-pressure chronic ulcer of unspecified ankle with unspecified severity: Secondary | ICD-10-CM

## 2014-06-03 DIAGNOSIS — M67919 Unspecified disorder of synovium and tendon, unspecified shoulder: Secondary | ICD-10-CM

## 2014-06-03 DIAGNOSIS — I82409 Acute embolism and thrombosis of unspecified deep veins of unspecified lower extremity: Secondary | ICD-10-CM

## 2014-06-03 DIAGNOSIS — Z7901 Long term (current) use of anticoagulants: Secondary | ICD-10-CM

## 2014-06-03 LAB — POCT INR: INR: 2.1

## 2014-06-03 NOTE — Progress Notes (Signed)
   Subjective:    Patient ID: Michele Morrison, female    DOB: [birth year], 58 y.o.   MRN: [id number]  HPI  Michele Morrison is here for f/u for ulcer.   She presents for an ulcer on her medial malleolus. Has been trying the DuoDerm for two weeks with minimal improvement. The erythema is diminished but but ulcer hasn't shruekn in asize. .   Current Outpatient Prescriptions on File Prior to Visit  Medication Sig Dispense Refill  . hydrochlorothiazide (HYDRODIURIL) 25 MG tablet TAKE 1 TABLET BY MOUTH ONCE DAILY  90 tablet  2  . warfarin (COUMADIN) 5 MG tablet TAKE BY MOUTH AS DIRECTED  120 tablet  3   No current facility-administered medications on file prior to visit.    Review of Systems See HPI    Objective:   Physical Exam BP 141/74  Pulse 80  Temp(Src) 98.1 F (36.7 C) (Oral)  Ht 5\' 6"  (1.676 m)  Wt 257 lb (116.574 kg)  BMI 41.50 kg/m2 Gen: NAD, alert, cooperative with exam, well-appearing Skin: ulcer on medial malleolus is weeping but improved since last time. No erythema. Not painful to palpation.       Assessment & Plan:

## 2014-06-03 NOTE — Patient Instructions (Signed)
Thank you for coming in,   We'll try the Unna boot or a week. If you have no problems, follow up with me in one week and we'll either do another Unna boot or try the duoderm patch again.   Please call back if you have any rash from the boot and we'll get you in to take it off and try something different.    Please feel free to call with any questions or concerns at any time, at [phone]. --Dr. Raeford Razor

## 2014-06-05 NOTE — Assessment & Plan Note (Signed)
Improving with week of duoderm. Still weeping.  - Unna boot applied - f/u in one week

## 2014-06-09 ENCOUNTER — Encounter: Payer: Self-pay | Admitting: Family Medicine

## 2014-06-09 ENCOUNTER — Ambulatory Visit (INDEPENDENT_AMBULATORY_CARE_PROVIDER_SITE_OTHER): Payer: Self-pay | Admitting: Family Medicine

## 2014-06-09 VITALS — BP 151/80 | HR 77 | Temp 98.5°F | Wt 253.0 lb

## 2014-06-09 DIAGNOSIS — M25579 Pain in unspecified ankle and joints of unspecified foot: Secondary | ICD-10-CM

## 2014-06-09 DIAGNOSIS — L97309 Non-pressure chronic ulcer of unspecified ankle with unspecified severity: Secondary | ICD-10-CM

## 2014-06-09 DIAGNOSIS — L97319 Non-pressure chronic ulcer of right ankle with unspecified severity: Secondary | ICD-10-CM

## 2014-06-09 DIAGNOSIS — M25571 Pain in right ankle and joints of right foot: Secondary | ICD-10-CM

## 2014-06-09 MED ORDER — GABAPENTIN 100 MG PO CAPS: 100.0000 mg | ORAL_CAPSULE | Freq: Three times a day (TID) | ORAL | Status: DC

## 2014-06-09 NOTE — Assessment & Plan Note (Signed)
Slow healing but granulation tissue forming. +2 pulses and no weakness and FROM of right ankle.  - unna boot for one more week then consider which treatment thereafter  - gabapentin 100 QHS for pain and itching  - if itching too much then consider adding medication. Patient becomes too drowsy with benadryl.

## 2014-06-09 NOTE — Progress Notes (Signed)
   Subjective:    Patient ID: Michele Morrison, female    DOB: 1956/05/18, 58 y.o.   MRN: [id number]  HPI  Michele Morrison is here for f/u for ankle ulcer.   Unna boot was placed last week on her right malleolus for a slow healing ankle ulcer. She had increased pain this week. Pain described as sharp with radiation up her leg. She denies any weakness, numbness or tingling in her toes. She also had itching of her right medial malleolus throughout this week. She didn't take anything for the itching or pain. The pain was so bad last night that she removed the unna boot.  Current Outpatient Prescriptions on File Prior to Visit  Medication Sig Dispense Refill  . hydrochlorothiazide (HYDRODIURIL) 25 MG tablet TAKE 1 TABLET BY MOUTH ONCE DAILY  90 tablet  2  . warfarin (COUMADIN) 5 MG tablet TAKE BY MOUTH AS DIRECTED  120 tablet  3   No current facility-administered medications on file prior to visit.   Review of Systems See HPI    Objective:   Physical Exam BP 151/80  Pulse 77  Temp(Src) 98.5 F (36.9 C) (Oral)  Wt 253 lb (114.76 kg) Gen: NAD, alert, cooperative with exam, well-appearing Skin: spot on medial malleolus is healing with granulation tissue formed. No weeping, with no pain on palpation. Itching occurred with palpation. Erythema improved. No depth to the previous ulcer.      Assessment & Plan:

## 2014-06-09 NOTE — Patient Instructions (Signed)
Thank you for coming in,   We will try the unna boot for another week. Schedule an appointment for next week and we'll see if we can switch to the duoderm.   I have prescribed gabapentin but you only have to take it 100 mg at night. This can help with itching as well.   If you have bad itching after starting the gabapentin then call in and I can send you another medication.     Please feel free to call with any questions or concerns at any time, at [phone]. --Dr. Raeford Razor

## 2014-06-20 ENCOUNTER — Encounter: Payer: Self-pay | Admitting: Family Medicine

## 2014-06-20 ENCOUNTER — Ambulatory Visit (INDEPENDENT_AMBULATORY_CARE_PROVIDER_SITE_OTHER): Payer: Self-pay | Admitting: Family Medicine

## 2014-06-20 VITALS — BP 138/83 | HR 73 | Temp 97.9°F | Wt 249.0 lb

## 2014-06-20 DIAGNOSIS — L97319 Non-pressure chronic ulcer of right ankle with unspecified severity: Secondary | ICD-10-CM

## 2014-06-20 DIAGNOSIS — L97309 Non-pressure chronic ulcer of unspecified ankle with unspecified severity: Secondary | ICD-10-CM

## 2014-06-20 NOTE — Patient Instructions (Signed)
Thank you for coming in,   Wash the spot with soap and water.   Please follow up with me if you have any problems.    Please feel free to call with any questions or concerns at any time, at [phone]. --Dr. Raeford Razor

## 2014-06-22 ENCOUNTER — Encounter: Payer: Self-pay | Admitting: Family Medicine

## 2014-06-22 NOTE — Progress Notes (Signed)
   Subjective:    Patient ID: Michele Morrison, female    DOB: [birth year], 58 y.o.   MRN: [id number]  HPI  Michele Morrison is here for f/u for ankle ulcer.   She presents with a delighted mood. Her ankle feels much better. She denies any burning, stinging pain. She thinks her ankle is much improved since we started putting the unna boots on her ankle.  She denies any fever, chills, night sweats.  She did not remove the unna boot since it was placed last week.   Current Outpatient Prescriptions on File Prior to Visit  Medication Sig Dispense Refill  . gabapentin (NEURONTIN) 100 MG capsule Take 1 capsule (100 mg total) by mouth 3 (three) times daily.  90 capsule  3  . hydrochlorothiazide (HYDRODIURIL) 25 MG tablet TAKE 1 TABLET BY MOUTH ONCE DAILY  90 tablet  2  . warfarin (COUMADIN) 5 MG tablet TAKE BY MOUTH AS DIRECTED  120 tablet  3   No current facility-administered medications on file prior to visit.   Review of Systems See hpi     Objective:   Physical Exam BP 138/83  Pulse 73  Temp(Src) 97.9 F (36.6 C) (Oral)  Wt 249 lb (112.946 kg) Gen: NAD, alert, cooperative with exam, well-appearing MSk: unna boot in place on right ankle, removal of boot reveals a well healed, no draining, no erythema, no pain to palpation.       Assessment & Plan:

## 2014-06-22 NOTE — Assessment & Plan Note (Signed)
Ulcer is well healed. No longer needing an unna boot. Will place a 4x4 just to cover the area.  - f/u PRN

## 2014-06-30 ENCOUNTER — Ambulatory Visit: Payer: Self-pay

## 2014-07-07 ENCOUNTER — Ambulatory Visit (INDEPENDENT_AMBULATORY_CARE_PROVIDER_SITE_OTHER): Payer: Self-pay | Admitting: *Deleted

## 2014-07-07 DIAGNOSIS — M25579 Pain in unspecified ankle and joints of unspecified foot: Secondary | ICD-10-CM

## 2014-07-07 DIAGNOSIS — M67919 Unspecified disorder of synovium and tendon, unspecified shoulder: Secondary | ICD-10-CM

## 2014-07-07 DIAGNOSIS — Z7901 Long term (current) use of anticoagulants: Secondary | ICD-10-CM

## 2014-07-07 DIAGNOSIS — M719 Bursopathy, unspecified: Secondary | ICD-10-CM

## 2014-07-07 DIAGNOSIS — I82409 Acute embolism and thrombosis of unspecified deep veins of unspecified lower extremity: Secondary | ICD-10-CM

## 2014-07-07 DIAGNOSIS — L97309 Non-pressure chronic ulcer of unspecified ankle with unspecified severity: Secondary | ICD-10-CM

## 2014-07-07 LAB — POCT INR: INR: 2.5

## 2014-07-24 ENCOUNTER — Ambulatory Visit (INDEPENDENT_AMBULATORY_CARE_PROVIDER_SITE_OTHER): Payer: Self-pay | Admitting: Family Medicine

## 2014-07-24 ENCOUNTER — Encounter: Payer: Self-pay | Admitting: Family Medicine

## 2014-07-24 VITALS — BP 154/72 | HR 69 | Temp 98.2°F | Ht 66.0 in

## 2014-07-24 DIAGNOSIS — L97319 Non-pressure chronic ulcer of right ankle with unspecified severity: Secondary | ICD-10-CM

## 2014-07-24 DIAGNOSIS — L97309 Non-pressure chronic ulcer of unspecified ankle with unspecified severity: Secondary | ICD-10-CM
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
## 2014-07-24 NOTE — Patient Instructions (Signed)
-   Please make a follow up appointment with Dr. Valentina Lucks in this clinic for ABI's  - Return to the clinic in 1 week to reevaluate your wound

## 2014-07-24 NOTE — Assessment & Plan Note (Signed)
Right ankle ulcer: Worsening; suspect arterial insufficiency. No documented Hb A1c, though she has no diagnostic glucose readings either.  - Unna boot  - Follow up in clinic in 1 week - Recommend wound center referral if not improving.  - Pain control with previously prescribed medications - ABIs in pharmacy clinic.

## 2014-08-04 ENCOUNTER — Encounter (HOSPITAL_BASED_OUTPATIENT_CLINIC_OR_DEPARTMENT_OTHER): Payer: Self-pay | Attending: Plastic Surgery

## 2014-08-04 ENCOUNTER — Ambulatory Visit: Payer: Self-pay

## 2014-08-04 ENCOUNTER — Ambulatory Visit: Payer: Self-pay | Admitting: Family Medicine

## 2014-08-04 DIAGNOSIS — L97309 Non-pressure chronic ulcer of unspecified ankle with unspecified severity: Secondary | ICD-10-CM | POA: Insufficient documentation

## 2014-08-05 NOTE — Consult Note (Signed)
Michele Morrison, Michele Morrison                ACCOUNT NO.:  [id number]  MEDICAL RECORD NO.:  [id number]  LOCATION:  FOOT                         FACILITY:  [facility]  PHYSICIAN:  Irene Limbo, MD   DATE OF BIRTH:  July 29, 1956  DATE OF CONSULTATION:  08/04/2014 DATE OF DISCHARGE:                                CONSULTATION   CHIEF COMPLAINT:  Right ankle ulceration.  HISTORY OF PRESENT ILLNESS:  Patient is a 58 year old ambulatory female who has been referred from the [facility] for continued ulceration of her right medial ankle.  This ulceration first started approximately 3 months ago.  She has been cared for by [employer]. Per review of notes, the wound had nearly completely healed.  However, she suffered an increase in size and is referred to the wound center for further evaluation and management.  Patient has a history of right lower extremity DVT from which she reports she has had chronic edema and varicosities of that leg. Since the development of the ulcer, she has had no recent imaging to rule out DVT.  She is currently uninsured and she has had no further workup in regards to reflux or vascular surgery consultation.  She works in a [employer] and states she is on her feet all day.  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  PAST SURGICAL HISTORY:  Knee arthroplasty and shoulder surgery.  MEDICATIONS:  Neurontin, Coumadin, and hydrochlorothiazide.  SOCIAL HISTORY:  Patient has never been a smoker.  PHYSICAL EXAMINATION:  VITAL SIGNS:  Blood pressure is 143/71, pulse is 54, temperature is 98.  Weight is 240 pounds, height is 5 feet 6 inches. EXTREMITIES:  She has positive all sensory points tested by Thornell Mule test.  ABI is not obtainable as the vessel is noncompressible.  Right calf circumference is 42.5 cm, right ankle is 23 cm.  She has visible varicosities present within the calf.  She  has a palpable dorsalis pedis pulse.  Right medial ankle has cluster superficial wounds measured as 3 x 1.5 x 0.1 cm.  There is one adherent Scab. After application of topical anesthetic, curette was used to remove the adherent scab. wound has minimal slough present.  She experienced significant tenderness to palpation with manipulation of the wound.  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  PLAN:  We will plan to institute a layered compression wrap with Profore Lite.  Discussed the importance of followup, at least weekly with [place] in the wound center as the compression wraps can cause secondary ulcerations and we need to have frequent checks of the area.  She states that she will be able to come on Mondays for routine followup. Counseled her that I recommend that she have an ultrasound to rule out DVT and to look for reflux.  Patient understands this.  However, due to her insurance statuses, it is likely that she will proceed with obtaining one.  She also reported that she has no feelings of a blood clot in her leg as she did in the past.  I have counseled her that she  may have absolutely no symptoms outside of the chronic edema of her legs and still have a blood clot.  We will plan for followup in 1 week's time and institute collagen over the wound itself.          ______________________________ Irene Limbo, MD MBA     BT/MEDQ  D:  08/04/2014  T:  08/05/2014  Job:  [id number]

## 2014-08-11 ENCOUNTER — Ambulatory Visit (INDEPENDENT_AMBULATORY_CARE_PROVIDER_SITE_OTHER): Payer: Self-pay | Admitting: *Deleted

## 2014-08-11 DIAGNOSIS — M25579 Pain in unspecified ankle and joints of unspecified foot: Secondary | ICD-10-CM

## 2014-08-11 DIAGNOSIS — M719 Bursopathy, unspecified: Secondary | ICD-10-CM

## 2014-08-11 DIAGNOSIS — M67919 Unspecified disorder of synovium and tendon, unspecified shoulder: Secondary | ICD-10-CM

## 2014-08-11 DIAGNOSIS — L97309 Non-pressure chronic ulcer of unspecified ankle with unspecified severity: Secondary | ICD-10-CM

## 2014-08-11 DIAGNOSIS — Z7901 Long term (current) use of anticoagulants: Secondary | ICD-10-CM

## 2014-08-11 DIAGNOSIS — I82409 Acute embolism and thrombosis of unspecified deep veins of unspecified lower extremity: Secondary | ICD-10-CM

## 2014-08-11 LAB — POCT INR: INR: 2.8

## 2014-08-18 ENCOUNTER — Encounter (HOSPITAL_BASED_OUTPATIENT_CLINIC_OR_DEPARTMENT_OTHER): Payer: Self-pay | Attending: Plastic Surgery

## 2014-08-18 DIAGNOSIS — Z86718 Personal history of other venous thrombosis and embolism: Secondary | ICD-10-CM | POA: Insufficient documentation

## 2014-08-18 DIAGNOSIS — L97319 Non-pressure chronic ulcer of right ankle with unspecified severity: Secondary | ICD-10-CM | POA: Insufficient documentation

## 2014-09-15 ENCOUNTER — Ambulatory Visit (INDEPENDENT_AMBULATORY_CARE_PROVIDER_SITE_OTHER): Payer: Self-pay | Admitting: *Deleted

## 2014-09-15 DIAGNOSIS — Z8672 Personal history of thrombophlebitis: Secondary | ICD-10-CM

## 2014-09-15 DIAGNOSIS — Z7901 Long term (current) use of anticoagulants: Secondary | ICD-10-CM

## 2014-09-15 DIAGNOSIS — I82409 Acute embolism and thrombosis of unspecified deep veins of unspecified lower extremity: Secondary | ICD-10-CM

## 2014-09-15 LAB — POCT INR: INR: 1.9

## 2014-10-13 ENCOUNTER — Ambulatory Visit (INDEPENDENT_AMBULATORY_CARE_PROVIDER_SITE_OTHER): Payer: 59 | Admitting: *Deleted

## 2014-10-13 DIAGNOSIS — Z7901 Long term (current) use of anticoagulants: Secondary | ICD-10-CM

## 2014-10-13 DIAGNOSIS — I82409 Acute embolism and thrombosis of unspecified deep veins of unspecified lower extremity: Secondary | ICD-10-CM

## 2014-10-13 LAB — POCT INR: INR: 1.9

## 2014-10-27 ENCOUNTER — Ambulatory Visit (INDEPENDENT_AMBULATORY_CARE_PROVIDER_SITE_OTHER): Payer: 59 | Admitting: *Deleted

## 2014-10-27 DIAGNOSIS — Z8672 Personal history of thrombophlebitis: Secondary | ICD-10-CM

## 2014-10-27 DIAGNOSIS — Z7901 Long term (current) use of anticoagulants: Secondary | ICD-10-CM

## 2014-10-27 DIAGNOSIS — I82409 Acute embolism and thrombosis of unspecified deep veins of unspecified lower extremity: Secondary | ICD-10-CM

## 2014-10-27 LAB — POCT INR: INR: 2.7

## 2014-11-24 ENCOUNTER — Ambulatory Visit (INDEPENDENT_AMBULATORY_CARE_PROVIDER_SITE_OTHER): Payer: Self-pay | Admitting: *Deleted

## 2014-11-24 DIAGNOSIS — Z8672 Personal history of thrombophlebitis: Secondary | ICD-10-CM

## 2014-11-24 DIAGNOSIS — Z7901 Long term (current) use of anticoagulants: Secondary | ICD-10-CM

## 2014-11-24 DIAGNOSIS — I82409 Acute embolism and thrombosis of unspecified deep veins of unspecified lower extremity: Secondary | ICD-10-CM

## 2014-11-24 LAB — POCT INR: INR: 3.9

## 2014-12-08 ENCOUNTER — Encounter: Payer: Self-pay | Admitting: Family Medicine

## 2014-12-08 ENCOUNTER — Ambulatory Visit (INDEPENDENT_AMBULATORY_CARE_PROVIDER_SITE_OTHER): Payer: 59 | Admitting: Family Medicine

## 2014-12-08 ENCOUNTER — Ambulatory Visit (INDEPENDENT_AMBULATORY_CARE_PROVIDER_SITE_OTHER): Payer: 59 | Admitting: *Deleted

## 2014-12-08 VITALS — BP 151/88 | HR 81 | Temp 98.6°F | Ht 66.0 in | Wt 247.0 lb

## 2014-12-08 DIAGNOSIS — I82409 Acute embolism and thrombosis of unspecified deep veins of unspecified lower extremity: Secondary | ICD-10-CM

## 2014-12-08 DIAGNOSIS — R109 Unspecified abdominal pain: Secondary | ICD-10-CM

## 2014-12-08 LAB — POCT URINALYSIS DIPSTICK
Blood, UA: NEGATIVE
Glucose, UA: NEGATIVE
Ketones, UA: NEGATIVE
Leukocytes, UA: NEGATIVE
Nitrite, UA: NEGATIVE
PH UA: 5.5
Protein, UA: 30
Spec Grav, UA: >=1.03
Urobilinogen, UA: 0.2

## 2014-12-08 LAB — COMPREHENSIVE METABOLIC PANEL
ALBUMIN: 4.1 g/dL (ref 3.5–5.2)
ALT: 31 U/L (ref 0–35)
AST: 25 U/L (ref 0–37)
Alkaline Phosphatase: 121 U/L — ABNORMAL HIGH (ref 39–117)
BILIRUBIN TOTAL: 0.5 mg/dL (ref 0.2–1.2)
BUN: 13 mg/dL (ref 6–23)
CO2: 27 mEq/L (ref 19–32)
CREATININE: 0.66 mg/dL (ref 0.50–1.10)
Calcium: 10.1 mg/dL (ref 8.4–10.5)
Chloride: 101 mEq/L (ref 96–112)
GLUCOSE: 98 mg/dL (ref 70–99)
Potassium: 3.9 mEq/L (ref 3.5–5.3)
SODIUM: 138 meq/L (ref 135–145)
TOTAL PROTEIN: 7.3 g/dL (ref 6.0–8.3)

## 2014-12-08 LAB — POCT INR: INR: 1.9

## 2014-12-08 NOTE — Patient Instructions (Signed)

## 2014-12-08 NOTE — Progress Notes (Addendum)
   Subjective:    Patient ID: Michele Morrison, female    DOB: [birth year], 59 y.o.   MRN: [id number]  HPI  Right sided pain: crampy, like period, constant, x28months but has worsened over past 2 weeks, 4/10, worse with cough, no alleviating factors.  Has tried tylenol with no improvement. - has appendix, LMP years ago - has not had colonoscopy before  HTN: did not take any medications (HCTZ) today  Past Medical History  Diagnosis Date  . H/O blood clots    Past Surgical History  Procedure Laterality Date  . Total knee arthroplasty    . Shoulder surgery      Review of Systems  Constitutional: Negative for chills and diaphoresis.  Respiratory: Negative for cough and shortness of breath.   Cardiovascular: Negative for leg swelling.  Gastrointestinal: Positive for abdominal pain and constipation (reports 2 days after full moon gets constipated). Negative for nausea, vomiting, diarrhea and anal bleeding.  Endocrine: Negative for cold intolerance and heat intolerance.  Genitourinary: Positive for urgency. Negative for dysuria, decreased urine volume and difficulty urinating.  Neurological: Negative for headaches.   CT abd 2009 IMPRESSION:  1. Minimal descending colonic diverticulosis without diverticulitis. 2. Distal right UVJ stone measuring 3.5 mm.    Objective:   Physical Exam  Constitutional: She appears well-developed and well-nourished. No distress.  HENT:  Mouth/Throat: Mucous membranes are moist. Pharynx is normal.  Eyes: Conjunctivae and EOM are normal.  Neck: No adenopathy.  Cardiovascular: Normal rate and S2 normal.   Pulmonary/Chest: Effort normal.  Abdominal: She exhibits no distension and no mass. There is tenderness (mild RLQ, mild RUQ only with aggressive exam). There is no rebound.  Musculoskeletal: Normal range of motion.  Neurological: She is alert. No cranial nerve deficit. Coordination normal.  Skin: Skin is warm. No rash noted. She is not  diaphoretic. No pallor.   BP 151/88 mmHg  Pulse 81  Temp(Src) 98.6 F (37 C) (Oral)  Ht 5\' 6"  (1.676 m)  Wt 247 lb (112.038 kg)  BMI 39.89 kg/m2        Assessment & Plan:  Right lower abdominal pain: - no association with food => low suspicion of biliary colic, pt agreeable and declines eval for this reporting she had "gallbladder pain years ago and this is different" - has not had coloncancer screening => referred for screening - CMP ordered 2/2 mild RUQ TTP, along with hepatitis C screen - has not had pap since 2009, needs screening with bimanual exam to evaluate adnexa, declines. - no suspicion of appendicitis  Merla Riches, MD

## 2014-12-09 LAB — HEPATITIS C ANTIBODY: HCV Ab: NEGATIVE

## 2014-12-15 ENCOUNTER — Encounter: Payer: Self-pay | Admitting: Physician Assistant

## 2014-12-15 ENCOUNTER — Encounter: Payer: Self-pay | Admitting: *Deleted

## 2014-12-16 ENCOUNTER — Encounter: Payer: Self-pay | Admitting: Obstetrics and Gynecology

## 2014-12-22 ENCOUNTER — Other Ambulatory Visit (INDEPENDENT_AMBULATORY_CARE_PROVIDER_SITE_OTHER): Payer: 59

## 2014-12-22 ENCOUNTER — Telehealth: Payer: Self-pay | Admitting: *Deleted

## 2014-12-22 ENCOUNTER — Encounter: Payer: Self-pay | Admitting: Physician Assistant

## 2014-12-22 ENCOUNTER — Ambulatory Visit (INDEPENDENT_AMBULATORY_CARE_PROVIDER_SITE_OTHER)
Admission: RE | Admit: 2014-12-22 | Discharge: 2014-12-22 | Disposition: A | Discharge status: Home / Self Care | Payer: 59 | Source: Ambulatory Visit | Attending: Physician Assistant | Admitting: Physician Assistant

## 2014-12-22 ENCOUNTER — Ambulatory Visit (INDEPENDENT_AMBULATORY_CARE_PROVIDER_SITE_OTHER): Payer: 59 | Admitting: Physician Assistant

## 2014-12-22 ENCOUNTER — Other Ambulatory Visit: Payer: Self-pay | Admitting: *Deleted

## 2014-12-22 VITALS — BP 136/82 | HR 72 | Ht 64.0 in | Wt 249.1 lb

## 2014-12-22 DIAGNOSIS — R1031 Right lower quadrant pain: Secondary | ICD-10-CM

## 2014-12-22 DIAGNOSIS — Z800 Family history of malignant neoplasm of digestive organs: Secondary | ICD-10-CM

## 2014-12-22 DIAGNOSIS — Z87442 Personal history of urinary calculi: Secondary | ICD-10-CM

## 2014-12-22 LAB — CBC WITH DIFFERENTIAL/PLATELET
BASOS ABS: 0.1 10*3/uL (ref 0.0–0.1)
BASOS PCT: 1 % (ref 0.0–3.0)
EOS PCT: 2.6 % (ref 0.0–5.0)
Eosinophils Absolute: 0.2 10*3/uL (ref 0.0–0.7)
HCT: 45 % (ref 36.0–46.0)
HEMOGLOBIN: 14.8 g/dL (ref 12.0–15.0)
Lymphocytes Relative: 30.5 % (ref 12.0–46.0)
Lymphs Abs: 2.4 10*3/uL (ref 0.7–4.0)
MCHC: 33 g/dL (ref 30.0–36.0)
MCV: 89.9 fl (ref 78.0–100.0)
MONO ABS: 0.7 10*3/uL (ref 0.1–1.0)
Monocytes Relative: 9.2 % (ref 3.0–12.0)
NEUTROS PCT: 56.7 % (ref 43.0–77.0)
Neutro Abs: 4.4 10*3/uL (ref 1.4–7.7)
Platelets: 214 10*3/uL (ref 150.0–400.0)
RBC: 5 Mil/uL (ref 3.87–5.11)
RDW: 14.6 % (ref 11.5–15.5)
WBC: 7.8 10*3/uL (ref 4.0–10.5)

## 2014-12-22 LAB — PROTIME-INR
INR: 1.9 ratio — ABNORMAL HIGH (ref 0.8–1.0)
PROTHROMBIN TIME: 21 s — AB (ref 9.6–13.1)

## 2014-12-22 MED ORDER — IOHEXOL 300 MG/ML  SOLN
100.0000 mL | Freq: Once | INTRAMUSCULAR | Status: AC | PRN
  Administered 2014-12-22: 100 mL via INTRAVENOUS

## 2014-12-22 MED ORDER — HYDROCHLOROTHIAZIDE 25 MG PO TABS: ORAL_TABLET | ORAL | Status: DC

## 2014-12-22 MED ORDER — MOVIPREP 100 G PO SOLR: 1.0000 | ORAL | Status: DC

## 2014-12-22 NOTE — Progress Notes (Addendum)
Patient ID: Michele Morrison, female   DOB: [birth year], 59 y.o.   MRN: [id number]   Subjective:    Patient ID: Michele Morrison, female    DOB: [birth year], 59 y.o.   MRN: [id number]  HPI Michele Morrison is a pleasant 59 year old white female, new to GI, referred today by [facility] for evaluation of right lower quadrant abdominal pain. Patient has history of ureterolithiasis and had hydronephrosis from a UPJ stone on CT scan in 2009. She says she was able to pass that stone on her own. CT at that time also showed left-sided diverticulosis. Patient has not had any prior GI evaluation or colonoscopy. Family history is positive for mother with colon cancer in her late 45s. Patient has had prior knee surgeries and had a DVT in her right leg in the 1990s. She has had some recurrent phlebitis apparently but not DVT since and has been On chronic Coumadin. Other medical problems include obesity and hypertension. Patient says she's been having right lower quadrant abdominal pain over the past 2 months. She says it's constant but varies in intensity and has not moved her changed since onset. She said onset was fairly abrupt. She does guards it as a constant dull ache sometimes radiating into the right lower back. She has had no associated nausea or vomiting no fever or chills. Appetite is been fine and weight has been stable. No change in pain with by mouth intake. She has noted some worsening with cough no change in pain with positional maneuvers no injury etc. She's not noticed any changes in her bowels melena or hematochezia. Labs were done and were negative with the exception of an alkaline phosphatase of 121 CBC was not done. She's not had any imaging.  Review of Systems Pertinent positive and negative review of systems were noted in the above HPI section.  All other review of systems was otherwise negative.  Outpatient Encounter Prescriptions as of 12/22/2014  Medication Sig  . hydrochlorothiazide (HYDRODIURIL)  25 MG tablet TAKE 1 TABLET BY MOUTH ONCE DAILY  . warfarin (COUMADIN) 5 MG tablet TAKE BY MOUTH AS DIRECTED  . MOVIPREP 100 G SOLR Take 1 kit (200 g total) by mouth as directed.  . [DISCONTINUED] gabapentin (NEURONTIN) 100 MG capsule Take 1 capsule (100 mg total) by mouth 3 (three) times daily.   Allergies  Allergen Reactions  . Cephalexin Rash    REACTION: unspecified  . Penicillins Other (See Comments)    unknown   Patient Active Problem List   Diagnosis Date Noted  . Ulcer of ankle 05/27/2014  . Other malaise and fatigue 01/01/2014  . Rash and nonspecific skin eruption 01/01/2014  . Encounter for long-term (current) use of anticoagulants 12/25/2010  . ESSENTIAL HYPERTENSION, BENIGN 05/03/2010  . OBESITY 02/07/2008  . DVT 01/16/2007   History   Social History  . Marital Status: Single    Spouse Name: N/A    Number of Children: 0  . Years of Education: N/A   Occupational History  . pastry chef    Social History Main Topics  . Smoking status: Never Smoker   . Smokeless tobacco: Never Used  . Alcohol Use: 0.0 oz/week    0 Not specified per week     Comment: rarely  . Drug Use: No  . Sexual Activity: No   Other Topics Concern  . Not on file   Social History Narrative    Michele Morrison's family history includes Cancer in her father; Colon cancer (age  of onset: 46) in her mother; Coronary artery disease in her father and mother.      Objective:    Filed Vitals:   12/22/14 0834  BP: 136/82  Pulse: 72    Physical Exam  well-developed white female in no acute distress, pleasant blood pressure 132/82 pulse 72 height 5 foot 4 weight 249, BMI 42. HEENT; nontraumatic normocephalic EOMI PERRLA sclera anicteric, Supple ;no JVD, Cardiovascular ;regular rate and rhythm with S1-S2 no murmur or gallop, Pulmonary; clear bilaterally, Abdomen; obese soft, bowel sounds are present, she is tender with deep palpation in the right lower quadrant there is no palpable mass or  hepatosplenomegaly no guarding or rebound, Rectal ;exam not done, Extremities; no clubbing cyanosis or edema skin warm and dry, Psych ;mood and affect appropriate     Assessment & Plan:   #1  59 yo female with 2 month hx of RLQ abdominal pain. Etiology not clear , patient does have history of ureterolithiasis and will rule out. Also rule out other intra-abdominal inflammatory process or occult neoplasm. #2 positive family history of colon cancer in patient's mother late 70s/patient has not had colon neoplasia surveillance #3 obesity #4 hypertension #5 chronic anticoagulation with Coumadin #6 history of DVT  Plan; Will schedule for CT scan of the abdomen and pelvis with contrast Check CBC Schedule for colonoscopy with Dr.Pyrtle. Procedures discussed in detail with patient and she is agreeable to proceed. We'll need to obtain consent from: Family medicine for patient to hold Coumadin for 4-5 days prior to procedure She asked about aspirin and is advised is fine for her to take an aspirin daily. Further plans pending findings of above.   Amy S Esterwood PA-C 12/22/2014  Addendum: Reviewed and agree with initial management. Jerene Bears, MD

## 2014-12-22 NOTE — Telephone Encounter (Signed)
MRN: [id number]   Dear Dr. Luiz Blare,,    We have scheduled the above patient for an endoscopic procedure. Our records show that she is on anticoagulation therapy.   Please advise as to how long the patient may come off her therapy of Coumadin prior to the procedure, which is scheduled for 01-14-2015.  Please fax back/ or route the completed form to [facility] at [phone].   Sincerely,    Amy Esterwood PA-C

## 2014-12-22 NOTE — Patient Instructions (Addendum)
Please go to the basement level to have your labs drawn. You have been scheduled for a colonoscopy. Please follow written instructions given to you at your visit today.  Please pick up your prep kit at the pharmacy within the next 1-3 days.[facility] [address]  If you use inhalers (even only as needed), please bring them with you on the day of your procedure.    You have been scheduled for a CT scan of the abdomen and pelvis at [facility] ([address]---this is in the same building as [facility]).   You are scheduled on 12-22-2014 at 1:00 PM . You should arrive at 12:45 Am  prior to your appointment time for registration. Please follow the written instructions below on the day of your exam:  WARNING: IF YOU ARE ALLERGIC TO IODINE/X-RAY DYE, PLEASE NOTIFY RADIOLOGY IMMEDIATELY AT [phone]! YOU WILL BE GIVEN A 13 HOUR PREMEDICATION PREP.  1) Do not eat or drink anything until after the test. 2) You have been given 2 bottles of oral contrast to drink. The solution may taste   better if refrigerated, but do NOT add ice or any other liquid to this solution. Shake  well before drinking.    Drink 1 bottle of contrast @ 11:00 am  (2 hours prior to your exam)  Drink 1 bottle of contrast @ 12:00 am  (1 hour prior to your exam)  You may take any medications as prescribed with a small amount of water except for the following: Metformin, Glucophage, Glucovance, Avandamet, Riomet, Fortamet, Actoplus Met, Janumet, Glumetza or Metaglip. The above medications must be held the day of the exam AND 48 hours after the exam.  The purpose of you drinking the oral contrast is to aid in the visualization of your intestinal tract. The contrast solution may cause some diarrhea. Before your exam is started, you will be given a small amount of fluid to drink. Depending on your individual set of symptoms, you may also receive an intravenous injection of x-ray contrast/dye. Plan on being at  [facility] for 30 minutes or long, depending on the type of exam you are having performed.  If you have any questions regarding your exam or if you need to reschedule, you may call the CT department at [phone] between the hours of 8:00 am and 5:00 pm, Monday-Friday.  ________________________________________________________________________ You may have a light breakfast the morning of prep day (the day before the procedure). You may choose from: eggs, toast, chicken noodle soup, crackers.  You should have your breakfast completed between 8:00 and 9:00 am.  Clear liquids only for the rest of the day on prep day and up until 3 hours before procedure.  

## 2014-12-23 ENCOUNTER — Encounter: Payer: Self-pay | Admitting: Obstetrics and Gynecology

## 2014-12-23 NOTE — Telephone Encounter (Signed)
Patient sent letter stating the following: Please come into clinic for an INR check on 01/08/2015. If you are therapeutic at that time, please discontinue warfarin 5 days prior to procedure (01/09/2014). In place of warfarin please take aspirin 81mg . You can resume warfarin 12-24hrs after procedure at previous dose. Once you have resumed please come back to clinic after a week to have INR rechecked.  If not therapeutic will consider bridging. However, patient is usually well controlled.  Luiz Blare, DO 12/23/2014, 12:16 PM PGY-1, [place]

## 2014-12-24 ENCOUNTER — Telehealth: Payer: Self-pay | Admitting: *Deleted

## 2014-12-24 NOTE — Telephone Encounter (Signed)
-----   Message from Katheren Shams, DO sent at 12/23/2014 12:05 PM EST ----- Patient sent letter to discontinue warfarin 5 days prior to colonoscopy. She is to however take ASA 81mg  during this time. She can resume warfarin therapy 12-24hrs after procedure.   Please let me know if I can assist in any other way. Thanks!

## 2014-12-24 NOTE — Telephone Encounter (Signed)
Advised on message for patient to call me if she has any questions on my coumadin instructions from Dr. Gerarda Fraction.

## 2014-12-24 NOTE — Telephone Encounter (Signed)
Dr Gerarda Fraction got back to [place] on the warfarin and I called the patient and LM for her.  I LM that she can stop the Warfarin on 2-26 and resume on 01-15-2015. She can also stay on the aspirin. I LM for her to call me if she has any questions.

## 2014-12-30 ENCOUNTER — Telehealth: Payer: Self-pay | Admitting: *Deleted

## 2014-12-30 NOTE — Telephone Encounter (Signed)
I did get this patient this time and she did get my message to be off the coumadin  5 days prior to the procedure date and including that date , 6 days all toghether.  The patient verbalized understanding the instructions.

## 2015-01-01 ENCOUNTER — Encounter: Payer: Self-pay | Admitting: Internal Medicine

## 2015-01-05 ENCOUNTER — Ambulatory Visit: Payer: 59

## 2015-01-05 ENCOUNTER — Ambulatory Visit (INDEPENDENT_AMBULATORY_CARE_PROVIDER_SITE_OTHER): Payer: 59 | Admitting: *Deleted

## 2015-01-05 DIAGNOSIS — Z7901 Long term (current) use of anticoagulants: Secondary | ICD-10-CM

## 2015-01-05 DIAGNOSIS — I82409 Acute embolism and thrombosis of unspecified deep veins of unspecified lower extremity: Secondary | ICD-10-CM

## 2015-01-05 LAB — POCT INR: INR: 1.4

## 2015-01-14 ENCOUNTER — Encounter: Payer: Self-pay | Admitting: Internal Medicine

## 2015-01-14 ENCOUNTER — Ambulatory Visit (AMBULATORY_SURGERY_CENTER): Payer: 59 | Admitting: Internal Medicine

## 2015-01-14 VITALS — BP 130/67 | HR 64 | Temp 96.6°F | Resp 19 | Ht 64.0 in | Wt 249.0 lb

## 2015-01-14 DIAGNOSIS — D124 Benign neoplasm of descending colon: Secondary | ICD-10-CM

## 2015-01-14 DIAGNOSIS — R1031 Right lower quadrant pain: Secondary | ICD-10-CM

## 2015-01-14 DIAGNOSIS — D120 Benign neoplasm of cecum: Secondary | ICD-10-CM

## 2015-01-14 DIAGNOSIS — Z800 Family history of malignant neoplasm of digestive organs: Secondary | ICD-10-CM

## 2015-01-14 DIAGNOSIS — K635 Polyp of colon: Secondary | ICD-10-CM

## 2015-01-14 DIAGNOSIS — D125 Benign neoplasm of sigmoid colon: Secondary | ICD-10-CM

## 2015-01-14 MED ORDER — SODIUM CHLORIDE 0.9 % IV SOLN: 500.0000 mL | INTRAVENOUS | Status: DC

## 2015-01-14 NOTE — Progress Notes (Signed)
A/ox3, pleased with MAC, report to RN 

## 2015-01-14 NOTE — Op Note (Signed)
[facility]  [address] [place], [id number]   COLONOSCOPY PROCEDURE REPORT  PATIENT: Zenobia, Kuennen  MR#: [id number] BIRTHDATE: 07-Jun-1956 , 52  yrs. old GENDER: female ENDOSCOPIST: Jerene Bears, MD REFERRED BY: Luiz Blare PROCEDURE DATE:  01/14/2015 PROCEDURE:   Colonoscopy with snare polypectomy First Screening Colonoscopy - Avg.  risk and is 50 yrs.  old or older - No.  Prior Negative Screening - Now for repeat screening. N/A  History of Adenoma - Now for follow-up colonoscopy & has been > or = to 3 yrs.  N/A  Polyps Removed Today? Yes. ASA CLASS:   Class II INDICATIONS:patient's immediate family history of colon cancer and 1st colonoscopy, RLQ pain. MEDICATIONS: Monitored anesthesia care and Propofol 350 mg IV  DESCRIPTION OF PROCEDURE:   After the risks benefits and alternatives of the procedure were thoroughly explained, informed consent was obtained.  The digital rectal exam revealed no rectal mass.   The LB [id number]  endoscope was introduced through the anus and advanced to the terminal ileum which was intubated for a short distance. No adverse events experienced.   The quality of the prep was good, using MoviPrep  The instrument was then slowly withdrawn as the colon was fully examined.   COLON FINDINGS: The examined terminal ileum appeared to be normal. Five flat and sessile polyps, several with mucus caps, ranging between 5-2mm in size were found at the cecum (1), in the descending colon (2), and sigmoid colon (2).  Polypectomies were performed with a cold snare.  The resection was complete, the polyp tissue was completely retrieved and sent to histology.   There was moderate diverticulosis noted in the transverse colon, descending colon, and sigmoid colon.  Retroflexed views revealed no abnormalities. The time to cecum=2 minutes 41 seconds.  Withdrawal time=16 minutes 19 seconds.  The scope was withdrawn and the procedure  completed. COMPLICATIONS: There were no immediate complications.  ENDOSCOPIC IMPRESSION: 1.   The examined terminal ileum appeared to be normal 2.   Five sessile polyps ranging between 5-101mm in size were found at the cecum, in the descending colon, and sigmoid colon; polypectomies were performed with a cold snare 3.   Moderate diverticulosis was noted in the transverse colon, descending colon, and sigmoid colon  RECOMMENDATIONS: 1.  Avoid all NSAIDS for the next 2 weeks. 2.  Await pathology results 3.  High fiber diet 4.  Timing of repeat colonoscopy will be determined by pathology findings. 5.  You will receive a letter within 1-2 weeks with the results of your biopsy as well as final recommendations.  Please call my office if you have not received a letter after 3 weeks.  eSigned:  Jerene Bears, MD 01/14/2015 11:22 AM   cc: the patient, PCP   PATIENT NAME:  Pieper, Kasik MR#: [id number]

## 2015-01-14 NOTE — Progress Notes (Signed)
11:30 Vocal order from Dr. Zenovia Jarred to resume coumadin tonight.  Trilby Leaver, RN

## 2015-01-14 NOTE — Progress Notes (Signed)
Called to room to assist during endoscopic procedure.  Patient ID and intended procedure confirmed with present staff. Received instructions for my participation in the procedure from the performing physician.  

## 2015-01-14 NOTE — Patient Instructions (Signed)
YOU HAD AN ENDOSCOPIC PROCEDURE TODAY AT [facility]:   Refer to the procedure report that was given to you for any specific questions about what was found during the examination.  If the procedure report does not answer your questions, please call your gastroenterologist to clarify.  If you requested that your care partner not be given the details of your procedure findings, then the procedure report has been included in a sealed envelope for you to review at your convenience later.  YOU SHOULD EXPECT: Some feelings of bloating in the abdomen. Passage of more gas than usual.  Walking can help get rid of the air that was put into your GI tract during the procedure and reduce the bloating. If you had a lower endoscopy (such as a colonoscopy or flexible sigmoidoscopy) you may notice spotting of blood in your stool or on the toilet paper. If you underwent a bowel prep for your procedure, you may not have a normal bowel movement for a few days.  Please Note:  You might notice some irritation and congestion in your nose or some drainage.  This is from the oxygen used during your procedure.  There is no need for concern and it should clear up in a day or so.  SYMPTOMS TO REPORT IMMEDIATELY:   Following lower endoscopy (colonoscopy or flexible sigmoidoscopy):  Excessive amounts of blood in the stool  Significant tenderness or worsening of abdominal pains  Swelling of the abdomen that is new, acute  Fever of 100F or higher  A gastroenterologist can be reached at any hour by calling [phone].   DIET: Your first meal following the procedure should be a small meal and then it is ok to progress to your normal diet. Heavy or fried foods are harder to digest and may make you feel nauseous or bloated.  Likewise, meals heavy in dairy and vegetables can increase bloating.  Drink plenty of fluids but you should avoid alcoholic beverages for 24 hours.  ACTIVITY:  You should plan to take it  easy for the rest of today and you should NOT DRIVE or use heavy machinery until tomorrow (because of the sedation medicines used during the test).    FOLLOW UP: Our staff will call the number listed on your records the next business day following your procedure to check on you and address any questions or concerns that you may have regarding the information given to you following your procedure. If we do not reach you, we will leave a message.  However, if you are feeling well and you are not experiencing any problems, there is no need to return our call.  We will assume that you have returned to your regular daily activities without incident.  If any biopsies were taken you will be contacted by phone or by letter within the next 1-3 weeks.  Please call us at [phone] if you have not heard about the biopsies in 3 weeks.    SIGNATURES/CONFIDENTIALITY: You and/or your care partner have signed paperwork which will be entered into your electronic medical record.  These signatures attest to the fact that that the information above on your After Visit Summary has been reviewed and is understood.  Full responsibility of the confidentiality of this discharge information lies with you and/or your care-partner.     Handouts were given to your care partner on polyps, diverticulosis,and a high fiber diet with liberal fluid intake. You might notice some irritation in your nose or  drainage.  This may cause feelings of congestion.  This is from the oxygen, which can be drying.  This is no cause for concern; this should clear up in a few days.  You may resume your current medications today.  Also resume coumadin tonight per Dr. Hilarie Fredrickson.  Hold all NSAIDS for two weeks too. Await biopsy results. Please call if any questions or concerns.

## 2015-01-14 NOTE — Progress Notes (Signed)
No complaints noted in the recovery room. maw

## 2015-01-15 ENCOUNTER — Telehealth: Payer: Self-pay | Admitting: *Deleted

## 2015-01-15 NOTE — Telephone Encounter (Signed)
  Follow up Call-  Call back number 01/14/2015  Post procedure Call Back phone  # [phone]  Permission to leave phone message Yes     Patient questions:  Do you have a fever, pain , or abdominal swelling? No. Pain Score  0 *  Have you tolerated food without any problems? Yes.    Have you been able to return to your normal activities? Yes.    Do you have any questions about your discharge instructions: Diet   No. Medications  No. Follow up visit  No.  Do you have questions or concerns about your Care? No.  Actions: * If pain score is 4 or above: No action needed, pain <4.

## 2015-01-22 ENCOUNTER — Encounter: Payer: Self-pay | Admitting: Internal Medicine

## 2015-02-09 ENCOUNTER — Other Ambulatory Visit: Payer: Self-pay | Admitting: Family Medicine

## 2015-02-16 ENCOUNTER — Ambulatory Visit (INDEPENDENT_AMBULATORY_CARE_PROVIDER_SITE_OTHER): Payer: No Typology Code available for payment source | Admitting: *Deleted

## 2015-02-16 DIAGNOSIS — I82409 Acute embolism and thrombosis of unspecified deep veins of unspecified lower extremity: Secondary | ICD-10-CM | POA: Diagnosis not present

## 2015-02-16 DIAGNOSIS — Z7901 Long term (current) use of anticoagulants: Secondary | ICD-10-CM

## 2015-02-16 LAB — POCT INR: INR: 1.7

## 2015-03-02 ENCOUNTER — Ambulatory Visit (INDEPENDENT_AMBULATORY_CARE_PROVIDER_SITE_OTHER): Payer: No Typology Code available for payment source | Admitting: *Deleted

## 2015-03-02 DIAGNOSIS — I82409 Acute embolism and thrombosis of unspecified deep veins of unspecified lower extremity: Secondary | ICD-10-CM | POA: Diagnosis not present

## 2015-03-02 LAB — POCT INR: INR: 2.6

## 2015-03-30 ENCOUNTER — Ambulatory Visit (INDEPENDENT_AMBULATORY_CARE_PROVIDER_SITE_OTHER): Payer: No Typology Code available for payment source | Admitting: *Deleted

## 2015-03-30 DIAGNOSIS — I82409 Acute embolism and thrombosis of unspecified deep veins of unspecified lower extremity: Secondary | ICD-10-CM | POA: Diagnosis not present

## 2015-03-30 DIAGNOSIS — Z7901 Long term (current) use of anticoagulants: Secondary | ICD-10-CM

## 2015-03-30 LAB — POCT INR: INR: 1.4

## 2015-04-15 ENCOUNTER — Ambulatory Visit (INDEPENDENT_AMBULATORY_CARE_PROVIDER_SITE_OTHER): Payer: No Typology Code available for payment source | Admitting: *Deleted

## 2015-04-15 DIAGNOSIS — Z7901 Long term (current) use of anticoagulants: Secondary | ICD-10-CM | POA: Diagnosis not present

## 2015-04-15 DIAGNOSIS — I82409 Acute embolism and thrombosis of unspecified deep veins of unspecified lower extremity: Secondary | ICD-10-CM

## 2015-04-15 LAB — POCT INR: INR: 2.7

## 2015-05-11 ENCOUNTER — Ambulatory Visit (INDEPENDENT_AMBULATORY_CARE_PROVIDER_SITE_OTHER): Payer: No Typology Code available for payment source | Admitting: *Deleted

## 2015-05-11 DIAGNOSIS — I82409 Acute embolism and thrombosis of unspecified deep veins of unspecified lower extremity: Secondary | ICD-10-CM | POA: Diagnosis not present

## 2015-05-11 LAB — POCT INR: INR: 2.8

## 2015-05-21 ENCOUNTER — Other Ambulatory Visit: Payer: Self-pay | Admitting: Obstetrics and Gynecology

## 2015-05-21 MED ORDER — HYDROCHLOROTHIAZIDE 25 MG PO TABS: ORAL_TABLET | ORAL | Status: DC

## 2015-05-21 MED ORDER — WARFARIN SODIUM 5 MG PO TABS: ORAL_TABLET | ORAL | Status: DC

## 2015-05-21 NOTE — Telephone Encounter (Signed)
Pt called and would like refills on her Coumadin and Hydrochlorothiazide to her [facility] on [address]. jw

## 2015-05-25 ENCOUNTER — Telehealth: Payer: Self-pay | Admitting: Obstetrics and Gynecology

## 2015-05-25 MED ORDER — HYDROCHLOROTHIAZIDE 25 MG PO TABS: ORAL_TABLET | ORAL | Status: DC

## 2015-05-25 MED ORDER — WARFARIN SODIUM 5 MG PO TABS: ORAL_TABLET | ORAL | Status: DC

## 2015-05-25 NOTE — Telephone Encounter (Signed)
Patient called on 05/21/15 and requested that her Coumadin and HCTZ be sent to [facility] on [address] instead of [place] ( Refill request 05/21/15) . They was still sent to to [facility]. Patient would like these scripts to be re-sent to the correct pharmacy today, she will be out tomorrow. Please call patient when med have be sent so she can pick up.

## 2015-05-25 NOTE — Telephone Encounter (Signed)
Medication resent to [facility].  LM for patient on identified VM that this has been taken care of. Jazmin Hartsell,CMA

## 2015-06-08 ENCOUNTER — Ambulatory Visit (INDEPENDENT_AMBULATORY_CARE_PROVIDER_SITE_OTHER): Payer: No Typology Code available for payment source | Admitting: *Deleted

## 2015-06-08 DIAGNOSIS — Z7901 Long term (current) use of anticoagulants: Secondary | ICD-10-CM

## 2015-06-08 DIAGNOSIS — I82409 Acute embolism and thrombosis of unspecified deep veins of unspecified lower extremity: Secondary | ICD-10-CM | POA: Diagnosis not present

## 2015-06-08 LAB — POCT INR: INR: 3.3

## 2015-07-06 ENCOUNTER — Ambulatory Visit (INDEPENDENT_AMBULATORY_CARE_PROVIDER_SITE_OTHER): Payer: No Typology Code available for payment source | Admitting: *Deleted

## 2015-07-06 DIAGNOSIS — I82409 Acute embolism and thrombosis of unspecified deep veins of unspecified lower extremity: Secondary | ICD-10-CM | POA: Diagnosis not present

## 2015-07-06 LAB — POCT INR: INR: 2.8

## 2015-08-03 ENCOUNTER — Ambulatory Visit (INDEPENDENT_AMBULATORY_CARE_PROVIDER_SITE_OTHER): Payer: No Typology Code available for payment source | Admitting: *Deleted

## 2015-08-03 DIAGNOSIS — I82409 Acute embolism and thrombosis of unspecified deep veins of unspecified lower extremity: Secondary | ICD-10-CM

## 2015-08-03 LAB — POCT INR: INR: 4.3

## 2015-08-10 ENCOUNTER — Ambulatory Visit (INDEPENDENT_AMBULATORY_CARE_PROVIDER_SITE_OTHER): Payer: No Typology Code available for payment source | Admitting: *Deleted

## 2015-08-10 DIAGNOSIS — I82409 Acute embolism and thrombosis of unspecified deep veins of unspecified lower extremity: Secondary | ICD-10-CM

## 2015-08-10 LAB — POCT INR: INR: 2.7

## 2015-08-24 ENCOUNTER — Ambulatory Visit (INDEPENDENT_AMBULATORY_CARE_PROVIDER_SITE_OTHER): Payer: No Typology Code available for payment source | Admitting: *Deleted

## 2015-08-24 DIAGNOSIS — Z7901 Long term (current) use of anticoagulants: Secondary | ICD-10-CM | POA: Diagnosis not present

## 2015-08-24 DIAGNOSIS — I82409 Acute embolism and thrombosis of unspecified deep veins of unspecified lower extremity: Secondary | ICD-10-CM

## 2015-08-24 LAB — POCT INR: INR: 3.5

## 2015-09-21 ENCOUNTER — Ambulatory Visit (INDEPENDENT_AMBULATORY_CARE_PROVIDER_SITE_OTHER): Payer: No Typology Code available for payment source | Admitting: *Deleted

## 2015-09-21 DIAGNOSIS — I82409 Acute embolism and thrombosis of unspecified deep veins of unspecified lower extremity: Secondary | ICD-10-CM | POA: Diagnosis not present

## 2015-09-21 DIAGNOSIS — Z7901 Long term (current) use of anticoagulants: Secondary | ICD-10-CM

## 2015-09-21 LAB — POCT INR: INR: 2.6

## 2015-10-19 ENCOUNTER — Ambulatory Visit (INDEPENDENT_AMBULATORY_CARE_PROVIDER_SITE_OTHER): Payer: No Typology Code available for payment source | Admitting: *Deleted

## 2015-10-19 DIAGNOSIS — Z7901 Long term (current) use of anticoagulants: Secondary | ICD-10-CM | POA: Diagnosis not present

## 2015-10-19 LAB — POCT INR: INR: 3.1

## 2015-11-23 ENCOUNTER — Ambulatory Visit: Payer: No Typology Code available for payment source

## 2015-11-30 ENCOUNTER — Ambulatory Visit (INDEPENDENT_AMBULATORY_CARE_PROVIDER_SITE_OTHER): Payer: 59 | Admitting: *Deleted

## 2015-11-30 DIAGNOSIS — Z7901 Long term (current) use of anticoagulants: Secondary | ICD-10-CM

## 2015-11-30 LAB — POCT INR: INR: 3.1

## 2015-12-21 ENCOUNTER — Ambulatory Visit (INDEPENDENT_AMBULATORY_CARE_PROVIDER_SITE_OTHER): Payer: 59 | Admitting: Obstetrics and Gynecology

## 2015-12-21 ENCOUNTER — Encounter: Payer: Self-pay | Admitting: Obstetrics and Gynecology

## 2015-12-21 ENCOUNTER — Ambulatory Visit (INDEPENDENT_AMBULATORY_CARE_PROVIDER_SITE_OTHER): Payer: 59 | Admitting: *Deleted

## 2015-12-21 VITALS — BP 135/73 | HR 62 | Temp 98.1°F | Wt 215.3 lb

## 2015-12-21 DIAGNOSIS — Z7901 Long term (current) use of anticoagulants: Secondary | ICD-10-CM

## 2015-12-21 DIAGNOSIS — Z0000 Encounter for general adult medical examination without abnormal findings: Secondary | ICD-10-CM

## 2015-12-21 DIAGNOSIS — I10 Essential (primary) hypertension: Secondary | ICD-10-CM | POA: Diagnosis not present

## 2015-12-21 HISTORY — DX: Encounter for general adult medical examination without abnormal findings: Z00.00

## 2015-12-21 LAB — POCT INR: INR: 1.8

## 2015-12-21 MED ORDER — RIVAROXABAN 20 MG PO TABS: 20.0000 mg | ORAL_TABLET | Freq: Every day | ORAL | Status: DC

## 2015-12-21 NOTE — Assessment & Plan Note (Signed)
Patient presented to clinic to discuss switching her anticoagulation. Currently on Warfarin. She would like to switch to the newer NOAC. Discussed risk and benefits with patient and dosing of medications. Patient given Rx for Xarelto 20mg  daily to use indefinitely. INR today was 1.8 and she had not taken any warfarin so safe to start today. Handout given.

## 2015-12-21 NOTE — Assessment & Plan Note (Signed)
BP adequately controlled. Only medication is HCTZ. Will continue therapy. Denies needing refills at this time. Will need to obtain lab work at next visit. Patient declined today.

## 2015-12-21 NOTE — Progress Notes (Addendum)
    Subjective: Chief Complaint  Patient presents with  . medication update    HPI: Michele Morrison is a 60 y.o. presenting to clinic today to discuss the following:  #Hypertension Blood pressure at home: Does not measure Blood pressure today: normal range Severity: stable  Duration: long-term Taking Meds: HCTZ  Side effects: None ROS: Denies headache, dizziness, visual changes, nausea, vomiting, chest pain, abdominal pain or shortness of breath.  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 Had trial off before but clotted again so no on chronic managment Would get inflammation off medication Would like information about new agents Denies any current bleeding  Some easy brusing on warfarin   Health Maintenance: pap, flu, mammo, shingles    ROS noted in HPI.  Past Medical, Surgical, Social, and Family History Reviewed & Updated per EMR.   Objective: BP 135/73 mmHg  Pulse 62  Temp(Src) 98.1 F (36.7 C) (Oral)  Wt 215 lb 4.8 oz (97.659 kg) Vitals and nursing notes reviewed  Physical Exam  Constitutional: She is well-developed, well-nourished, and in no distress.  Cardiovascular: Normal rate, regular rhythm and normal heart sounds.   Pulmonary/Chest: Effort normal and breath sounds normal.  Musculoskeletal: She exhibits no edema.  Skin: Skin is warm and dry.  No bruising appreciated   INR today - 1.8  Assessment/Plan: Please see problem based Assessment and Plan  I have recommended that this patient have a flu shot but she declines at this time. I have discussed the risks and benefits of this with her. The patient verbalizes understanding.   Meds ordered this encounter  Medications  . rivaroxaban (XARELTO) 20 MG TABS tablet    Sig: Take 1 tablet (20 mg total) by mouth daily with supper.    Dispense:  30 tablet    Refill:  [place], DO 12/21/2015, 1:45 PM PGY-2, [place]

## 2015-12-21 NOTE — Patient Instructions (Addendum)
We will switch to the below medication when your INR is less than 2.   Sending to your pharmacy now so you can have  Please schedule follow-up appointment for physical exam (due for mammogram, flu, and pap smear, and blood work)  Rivaroxaban oral tablets What is this medicine? RIVAROXABAN (ri va ROX a ban) is an anticoagulant (blood thinner). It is used to treat blood clots in the lungs or in the veins. It is also used after knee or hip surgeries to prevent blood clots. It is also used to lower the chance of stroke in people with a medical condition called atrial fibrillation. This medicine may be used for other purposes; ask your health care provider or pharmacist if you have questions. What should I tell my health care provider before I take this medicine? They need to know if you have any of these conditions: -bleeding disorders -bleeding in the brain -blood in your stools (black or tarry stools) or if you have blood in your vomit -history of stomach bleeding -kidney disease -liver disease -low blood counts, like low white cell, platelet, or red cell counts -recent or planned spinal or epidural procedure -take medicines that treat or prevent blood clots -an unusual or allergic reaction to rivaroxaban, other medicines, foods, dyes, or preservatives -pregnant or trying to get pregnant -breast-feeding How should I use this medicine? Take this medicine by mouth with a glass of water. Follow the directions on the prescription label. Take your medicine at regular intervals. Do not take it more often than directed. Do not stop taking except on your doctor's advice. Stopping this medicine may increase your risk of a blood clot. Be sure to refill your prescription before you run out of medicine. If you are taking this medicine after hip or knee replacement surgery, take it with or without food. If you are taking this medicine for atrial fibrillation, take it with your evening meal. If you are  taking this medicine to treat blood clots, take it with food at the same time each day. If you are unable to swallow your tablet, you may crush the tablet and mix it in applesauce. Then, immediately eat the applesauce. You should eat more food right after you eat the applesauce containing the crushed tablet. Talk to your pediatrician regarding the use of this medicine in children. Special care may be needed. Overdosage: If you think you have taken too much of this medicine contact a poison control center or emergency room at once. NOTE: This medicine is only for you. Do not share this medicine with others. What if I miss a dose? If you take your medicine once a day and miss a dose, take the missed dose as soon as you remember. If you take your medicine twice a day and miss a dose, take the missed dose immediately. In this instance, 2 tablets may be taken at the same time. The next day you should take 1 tablet twice a day as directed. What may interact with this medicine? -aspirin and aspirin-like medicines -certain antibiotics like erythromycin, azithromycin, and clarithromycin -certain medicines for fungal infections like ketoconazole and itraconazole -certain medicines for irregular heart beat like amiodarone, quinidine, dronedarone -certain medicines for seizures like carbamazepine, phenytoin -certain medicines that treat or prevent blood clots like warfarin, enoxaparin, and dalteparin -conivaptan -diltiazem -felodipine -indinavir -lopinavir; ritonavir -NSAIDS, medicines for pain and inflammation, like ibuprofen or naproxen -ranolazine -rifampin -ritonavir -St. John's wort -verapamil This list may not describe all possible interactions. Give  your health care provider a list of all the medicines, herbs, non-prescription drugs, or dietary supplements you use. Also tell them if you smoke, drink alcohol, or use illegal drugs. Some items may interact with your medicine. What should I watch for  while using this medicine? Visit your doctor or health care professional for regular checks on your progress. Your condition will be monitored carefully while you are receiving this medicine. Notify your doctor or health care professional and seek emergency treatment if you develop breathing problems; changes in vision; chest pain; severe, sudden headache; pain, swelling, warmth in the leg; trouble speaking; sudden numbness or weakness of the face, arm, or leg. These can be signs that your condition has gotten worse. If you are going to have surgery, tell your doctor or health care professional that you are taking this medicine. Tell your health care professional that you use this medicine before you have a spinal or epidural procedure. Sometimes people who take this medicine have bleeding problems around the spine when they have a spinal or epidural procedure. This bleeding is very rare. If you have a spinal or epidural procedure while on this medicine, call your health care professional immediately if you have back pain, numbness or tingling (especially in your legs and feet), muscle weakness, paralysis, or loss of bladder or bowel control. Avoid sports and activities that might cause injury while you are using this medicine. Severe falls or injuries can cause unseen bleeding. Be careful when using sharp tools or knives. Consider using an Copy. Take special care brushing or flossing your teeth. Report any injuries, bruising, or red spots on the skin to your doctor or health care professional. What side effects may I notice from receiving this medicine? Side effects that you should report to your doctor or health care professional as soon as possible: -allergic reactions like skin rash, itching or hives, swelling of the face, lips, or tongue -back pain -redness, blistering, peeling or loosening of the skin, including inside the mouth -signs and symptoms of bleeding such as bloody or black,  tarry stools; red or dark-brown urine; spitting up blood or brown material that looks like coffee grounds; red spots on the skin; unusual bruising or bleeding from the eye, gums, or nose Side effects that usually do not require medical attention (Report these to your doctor or health care professional if they continue or are bothersome.): -dizziness -muscle pain This list may not describe all possible side effects. Call your doctor for medical advice about side effects. You may report side effects to FDA at 1-800-FDA-1088. Where should I keep my medicine? Keep out of the reach of children. Store at room temperature between 15 and 30 degrees C (59 and 86 degrees F). Throw away any unused medicine after the expiration date. NOTE: This sheet is a summary. It may not cover all possible information. If you have questions about this medicine, talk to your doctor, pharmacist, or health care provider.    2016, Elsevier/Gold Standard. (2014-10-29 12:45:34)

## 2015-12-21 NOTE — Assessment & Plan Note (Addendum)
Patient admits to not liking to come to doctor's office. Discussed with her the need to do yearly preventative visits. Patient is due for mammogram, pap smear,

## 2015-12-28 ENCOUNTER — Ambulatory Visit (INDEPENDENT_AMBULATORY_CARE_PROVIDER_SITE_OTHER): Payer: 59 | Admitting: *Deleted

## 2015-12-28 DIAGNOSIS — Z7901 Long term (current) use of anticoagulants: Secondary | ICD-10-CM

## 2015-12-28 LAB — POCT INR: INR: 1.6

## 2016-04-25 ENCOUNTER — Other Ambulatory Visit: Payer: Self-pay | Admitting: *Deleted

## 2016-04-25 MED ORDER — RIVAROXABAN 20 MG PO TABS: 20.0000 mg | ORAL_TABLET | Freq: Every day | ORAL | Status: DC

## 2016-04-25 NOTE — Telephone Encounter (Signed)
Patient needs a refill on her xarelto.  Pharmacy confirmed. Jazmin Hartsell,CMA

## 2016-05-25 ENCOUNTER — Other Ambulatory Visit: Payer: Self-pay | Admitting: *Deleted

## 2016-05-25 MED ORDER — HYDROCHLOROTHIAZIDE 25 MG PO TABS: ORAL_TABLET | ORAL | Status: DC

## 2016-07-12 ENCOUNTER — Telehealth: Payer: Self-pay | Admitting: Obstetrics and Gynecology

## 2016-07-12 NOTE — Telephone Encounter (Signed)
Will forward to MD to advise. Jazmin Hartsell,CMA  

## 2016-07-12 NOTE — Telephone Encounter (Signed)
Currently taking zarelto.  Her insurance ends August 30.  She needs a refill but it isnt due for refill until Sept 7.  She will not get any insurance for another 3 months. Pt wants to know if she needs a drs visit to be put back on coumadin or can the dr just call it in? Please advise.

## 2016-07-13 NOTE — Telephone Encounter (Signed)
Is there anyway we can get patient Xarelto in her 3 month time without insurance. I would hate to have to put her back on Warfarin. Any assistance is greatly appreciated.

## 2016-07-14 MED ORDER — RIVAROXABAN 20 MG PO TABS: 20.0000 mg | ORAL_TABLET | Freq: Every day | ORAL | 0 refills | Status: DC

## 2016-07-14 NOTE — Telephone Encounter (Signed)
Discussed with Dr. Valentina Lucks. We were able to get patient 90-days of medication without insurance to cover in her gap. Patient is switching jobs so insurance does not kick in for 3 months. She will come pick up her coupon on Tuesday. This will be left up front for patient in a envelope.

## 2016-07-19 ENCOUNTER — Telehealth: Payer: Self-pay | Admitting: Obstetrics and Gynecology

## 2016-07-21 ENCOUNTER — Telehealth: Payer: Self-pay | Admitting: Pharmacist

## 2016-07-21 NOTE — Telephone Encounter (Signed)
Called patient and left message for her to return call to [facility] Pharmacist.  Her Xarelto samples have arrived and are available for pickup.    Medication Samples have been provided to the patient.  Drug name: Xarelto       Strength: 20 mg        Qty: 10 x #7 tabs  LOT: [id number]  Exp.Date: 9/19  Dosing instructions: Take 1 tablet by mouth daily   Bennye Alm, PharmD [facility] PGY2 Pharmacy Resident [phone]

## 2016-07-27 ENCOUNTER — Telehealth: Payer: Self-pay | Admitting: Pharmacist

## 2016-07-27 NOTE — Telephone Encounter (Signed)
07/27/16  Called patient and she confirmed that she picked up her Xarelto samples last week.  Verified that she is taking her Xarelto 20 mg daily and she denied side effects or s/sx of bleeding.  Bennye Alm, PharmD [facility] PGY2 Pharmacy Resident [phone]

## 2016-10-17 ENCOUNTER — Other Ambulatory Visit: Payer: Self-pay | Admitting: Obstetrics and Gynecology

## 2016-10-17 NOTE — Telephone Encounter (Signed)
Needs refills on xarelto and HCTZ.   New pharmacy- [facility] on the [address] of [address]

## 2016-10-18 MED ORDER — HYDROCHLOROTHIAZIDE 25 MG PO TABS: ORAL_TABLET | ORAL | 2 refills | Status: DC

## 2016-10-18 MED ORDER — RIVAROXABAN 20 MG PO TABS: 20.0000 mg | ORAL_TABLET | Freq: Every day | ORAL | 3 refills | Status: DC

## 2016-12-22 ENCOUNTER — Ambulatory Visit (INDEPENDENT_AMBULATORY_CARE_PROVIDER_SITE_OTHER): Payer: BLUE CROSS/BLUE SHIELD | Admitting: *Deleted

## 2016-12-22 DIAGNOSIS — Z23 Encounter for immunization: Secondary | ICD-10-CM | POA: Diagnosis not present

## 2017-01-24 ENCOUNTER — Emergency Department (HOSPITAL_COMMUNITY): Payer: BLUE CROSS/BLUE SHIELD

## 2017-01-24 ENCOUNTER — Encounter (HOSPITAL_COMMUNITY): Payer: Self-pay

## 2017-01-24 ENCOUNTER — Emergency Department (HOSPITAL_COMMUNITY)
Admission: EM | Admit: 2017-01-24 | Discharge: 2017-01-25 | Disposition: A | Discharge status: Home / Self Care | Payer: BLUE CROSS/BLUE SHIELD | Attending: Emergency Medicine | Admitting: Emergency Medicine

## 2017-01-24 DIAGNOSIS — Z96652 Presence of left artificial knee joint: Secondary | ICD-10-CM | POA: Diagnosis not present

## 2017-01-24 DIAGNOSIS — I10 Essential (primary) hypertension: Secondary | ICD-10-CM | POA: Diagnosis not present

## 2017-01-24 DIAGNOSIS — M7989 Other specified soft tissue disorders: Secondary | ICD-10-CM | POA: Diagnosis present

## 2017-01-24 DIAGNOSIS — Z7901 Long term (current) use of anticoagulants: Secondary | ICD-10-CM | POA: Diagnosis not present

## 2017-01-24 DIAGNOSIS — M25529 Pain in unspecified elbow: Secondary | ICD-10-CM

## 2017-01-24 DIAGNOSIS — Z79899 Other long term (current) drug therapy: Secondary | ICD-10-CM | POA: Diagnosis not present

## 2017-01-24 DIAGNOSIS — M19022 Primary osteoarthritis, left elbow: Secondary | ICD-10-CM | POA: Insufficient documentation

## 2017-01-24 NOTE — ED Triage Notes (Addendum)
Pt states that she woke up yesterday morning with left arm pain that has gotten progressively worse today. She denies any recent injury or any source for the pain that she can think of. Arm is not red or swollen. Pt face is flushed and she is grimacing in triage. She states that she cannot straighter her arm. Denies chest pain, N/V, SOB or any other symptoms. A&Ox4. Ambulatory.

## 2017-01-25 ENCOUNTER — Emergency Department (HOSPITAL_COMMUNITY): Payer: BLUE CROSS/BLUE SHIELD

## 2017-01-25 LAB — COMPREHENSIVE METABOLIC PANEL
ALT: 19 U/L (ref 14–54)
ANION GAP: 8 (ref 5–15)
AST: 19 U/L (ref 15–41)
Albumin: 4.2 g/dL (ref 3.5–5.0)
Alkaline Phosphatase: 131 U/L — ABNORMAL HIGH (ref 38–126)
BUN: 18 mg/dL (ref 6–20)
CHLORIDE: 103 mmol/L (ref 101–111)
CO2: 26 mmol/L (ref 22–32)
Calcium: 10.3 mg/dL (ref 8.9–10.3)
Creatinine, Ser: 0.63 mg/dL (ref 0.44–1.00)
Glucose, Bld: 124 mg/dL — ABNORMAL HIGH (ref 65–99)
POTASSIUM: 3.5 mmol/L (ref 3.5–5.1)
Sodium: 137 mmol/L (ref 135–145)
Total Bilirubin: 0.7 mg/dL (ref 0.3–1.2)
Total Protein: 7.7 g/dL (ref 6.5–8.1)

## 2017-01-25 LAB — SEDIMENTATION RATE: SED RATE: 24 mm/h — AB (ref 0–22)

## 2017-01-25 LAB — CBC WITH DIFFERENTIAL/PLATELET
Basophils Absolute: 0 10*3/uL (ref 0.0–0.1)
Basophils Relative: 0 %
EOS ABS: 0.2 10*3/uL (ref 0.0–0.7)
Eosinophils Relative: 2 %
HCT: 43.1 % (ref 36.0–46.0)
Hemoglobin: 13.9 g/dL (ref 12.0–15.0)
Lymphocytes Relative: 24 %
Lymphs Abs: 2.5 10*3/uL (ref 0.7–4.0)
MCH: 29.9 pg (ref 26.0–34.0)
MCHC: 32.3 g/dL (ref 30.0–36.0)
MCV: 92.7 fL (ref 78.0–100.0)
MONO ABS: 1 10*3/uL (ref 0.1–1.0)
MONOS PCT: 9 %
Neutro Abs: 6.7 10*3/uL (ref 1.7–7.7)
Neutrophils Relative %: 65 %
PLATELETS: 207 10*3/uL (ref 150–400)
RBC: 4.65 MIL/uL (ref 3.87–5.11)
RDW: 13.4 % (ref 11.5–15.5)
WBC: 10.3 10*3/uL (ref 4.0–10.5)

## 2017-01-25 LAB — D-DIMER, QUANTITATIVE: D-Dimer, Quant: <0.27 ug/mL-FEU (ref 0.00–0.50)

## 2017-01-25 LAB — C-REACTIVE PROTEIN: CRP: 1.5 mg/dL — AB (ref <1.0)

## 2017-01-25 MED ORDER — ONDANSETRON 4 MG PO TBDP
4.0000 mg | ORAL_TABLET | Freq: Once | ORAL | Status: DC
  Filled 2017-01-25: qty 1

## 2017-01-25 MED ORDER — HYDROCODONE-ACETAMINOPHEN 5-325 MG PO TABS: 1.0000 | ORAL_TABLET | Freq: Four times a day (QID) | ORAL | 0 refills | Status: DC | PRN

## 2017-01-25 MED ORDER — OXYCODONE-ACETAMINOPHEN 5-325 MG PO TABS
1.0000 | ORAL_TABLET | Freq: Once | ORAL | Status: DC
  Filled 2017-01-25: qty 1

## 2017-01-25 MED ORDER — ONDANSETRON HCL 4 MG/2ML IJ SOLN: 4.0000 mg | Freq: Once | INTRAMUSCULAR | Status: DC

## 2017-01-25 MED ORDER — HYDROMORPHONE HCL 1 MG/ML IJ SOLN: 0.5000 mg | Freq: Once | INTRAMUSCULAR | Status: DC

## 2017-01-25 NOTE — Discharge Instructions (Signed)
Get help right away if: You are not able to move the joint. Your fingers or toes become numb or they turn cold and blue.

## 2017-01-25 NOTE — ED Provider Notes (Signed)
[facility] DEPT Provider Note   CSN: [id number] Arrival date & time: 01/24/17  2259  By signing my name below, I, Evelene Croon, attest that this documentation has been prepared under the direction and in the presence of Margarita Mail, PA-C. Electronically Signed: Evelene Croon, Scribe. 01/25/2017. 1:36 AM.  History   Chief Complaint Chief Complaint  Patient presents with  . Arm Pain    Left    The history is provided by the patient. No language interpreter was used.     HPI Comments:  Michele Morrison is a 61 y.o. female who presents to the Emergency Department complaining of atraumatic LUE pain that she woke up with on 01/23/2017. Pt has associated swelling to the left hand. She reports increased pain to the elbow region when she tries to extend the arm. No alleviating factors noted. 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    Past Medical History:  Diagnosis Date  . H/O blood clots   . HTN (hypertension)   . Kidney stones     Patient Active Problem List   Diagnosis Date Noted  . Preventative health care 12/21/2015  . Ulcer of ankle ([place]) 05/27/2014  . Other malaise and fatigue 01/01/2014  . Long term (current) use of anticoagulants 12/25/2010  . ESSENTIAL HYPERTENSION, BENIGN 05/03/2010  . OBESITY 02/07/2008    Past Surgical History:  Procedure Laterality Date  . ROTATOR CUFF REPAIR Right   . TOTAL KNEE ARTHROPLASTY Left 2012    OB History    No data available       Home Medications    Prior to Admission medications   Medication Sig Start Date End Date Taking? Authorizing Provider  hydrochlorothiazide (HYDRODIURIL) 25 MG tablet TAKE 1 TABLET BY MOUTH ONCE DAILY 10/18/16   Katheren Shams, DO  rivaroxaban (XARELTO) 20 MG TABS tablet Take 1 tablet (20 mg total) by mouth daily with supper. 10/18/16   Katheren Shams, DO    Family History Family History  Problem Relation Age of Onset  . Colon cancer Mother 5  . Coronary artery disease Mother   .  Cancer Father     Non-Hodgkin's lymphoma  . Coronary artery disease Father   . Stomach cancer Paternal Grandfather     pt unsure  . Esophageal cancer Neg Hx   . Rectal cancer Neg Hx     Social History Social History  Substance Use Topics  . Smoking status: Never Smoker  . Smokeless tobacco: Never Used  . Alcohol use 0.0 oz/week     Comment: rarely     Allergies   Cephalexin and Penicillins   Review of Systems Review of Systems  Constitutional: Negative for chills and fever.  Respiratory: Negative for shortness of breath.   Cardiovascular: Negative for chest pain.  Musculoskeletal: Positive for arthralgias, joint swelling and myalgias.  All other systems reviewed and are negative.    Physical Exam Updated Vital Signs BP 180/86 (BP Location: Right Arm)   Pulse (!) 54   Temp 98.1 F (36.7 C) (Oral)   Resp 20   Wt 220 lb (99.8 kg)   SpO2 97%   BMI 37.76 kg/m   Physical Exam  Constitutional: She is oriented to person, place, and time. She appears well-developed and well-nourished. No distress.  HENT:  Head: Normocephalic and atraumatic.  Eyes: Conjunctivae are normal.  Cardiovascular: Normal rate.   Pulmonary/Chest: Effort normal.  Abdominal: She exhibits no distension.  Musculoskeletal:  Pt is keeping left elbow at  60 degrees of flexion Swelling noted over the left lateral epicondyle Entire left forearm with swelling into the hand Venous distension noted to mid bicep  Severe pain with supination of the forearm  Strong pulse with good cap refill.   Neurological: She is alert and oriented to person, place, and time.  Skin: Skin is warm and dry. Capillary refill takes less than 2 seconds.  Psychiatric: She has a normal mood and affect.  Nursing note and vitals reviewed.    ED Treatments / Results  DIAGNOSTIC STUDIES:  Oxygen Saturation is 97% on RA, normal by my interpretation.    COORDINATION OF CARE:  1:33 AM Discussed treatment plan with pt at  bedside and pt agreed to plan.  Labs (all labs ordered are listed, but only abnormal results are displayed) Labs Reviewed - No data to display  EKG  EKG Interpretation None       Radiology Dg Forearm Left  Result Date: 01/25/2017 CLINICAL DATA:  61 year old female with left arm pain. No known injury. EXAM: LEFT HUMERUS - 2+ VIEW; LEFT FOREARM - 2 VIEW COMPARISON:  Left shoulder radiograph dated 03/22/2014 FINDINGS: There is no acute fracture or dislocation. The bones are osteopenic. There is a severe arthritic changes of the elbow with slight irregularity of the radial head and osteophyte formation. The soft tissues appear unremarkable. No joint effusion identified. IMPRESSION: 1. No acute fracture or dislocation. 2. 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 Electronically Signed   By: Anner Crete M.D.   On: 01/25/2017 00:08   Dg Humerus Left  Result Date: 01/25/2017 CLINICAL DATA:  61 year old female with left arm pain. No known injury. EXAM: LEFT HUMERUS - 2+ VIEW; LEFT FOREARM - 2 VIEW COMPARISON:  Left shoulder radiograph dated 03/22/2014 FINDINGS: There is no acute fracture or dislocation. The bones are osteopenic. There is a severe arthritic changes of the elbow with slight irregularity of the radial head and osteophyte formation. The soft tissues appear unremarkable. No joint effusion identified. IMPRESSION: 1. No acute fracture or dislocation. 2. 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 Electronically Signed   By: Anner Crete M.D.   On: 01/25/2017 00:08    Procedures Procedures (including critical care time)  Medications Ordered in ED Medications - No data to display   Initial Impression / Assessment and Plan / ED Course  I have reviewed the triage vital signs and the nursing notes.  Pertinent labs & imaging results that were available during my care of the patient were reviewed by me and considered in my medical  decision making (see chart for details).     Patient with him elbow pain. Her x-ray shows advanced degenerative changes. 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 Her d-dimer is negative and she is on Xarelto which is suggestive that she does not have a clot in the left upper extremity. The patient did not wish to take pen medications. However, I have advised her that she should not be taking NSAID therapy with her Xarelto and she will go home with a prescription for Norco today. She has an established relationship at [facility]. However, I have given her referral to our on-call hand orthopod. Patient has a minimally elevated sedimentation rate and is able to move the elbow. It. I doubt septic joint. Patient appears safe for discharge at this time. Final Clinical Impressions(s) / ED Diagnoses  Final diagnoses:  Elbow pain  Arthropathy of left elbow    New Prescriptions New Prescriptions   No medications on file   I personally performed the services described in this documentation, which was scribed in my presence. The recorded information has been reviewed and is accurate.       Margarita Mail, PA-C 01/29/17 0010    Shanon Rosser, MD 01/30/17 [phone]

## 2017-01-25 NOTE — ED Notes (Signed)
Gave report to Emily RN

## 2017-04-25 ENCOUNTER — Ambulatory Visit (INDEPENDENT_AMBULATORY_CARE_PROVIDER_SITE_OTHER): Payer: BLUE CROSS/BLUE SHIELD | Admitting: Family Medicine

## 2017-04-25 ENCOUNTER — Encounter: Payer: Self-pay | Admitting: Family Medicine

## 2017-04-25 ENCOUNTER — Telehealth: Payer: Self-pay | Admitting: Family Medicine

## 2017-04-25 ENCOUNTER — Ambulatory Visit
Admission: RE | Admit: 2017-04-25 | Discharge: 2017-04-25 | Disposition: A | Discharge status: Home / Self Care | Payer: BLUE CROSS/BLUE SHIELD | Source: Ambulatory Visit | Attending: Family Medicine | Admitting: Family Medicine

## 2017-04-25 VITALS — BP 128/70 | HR 58 | Temp 97.7°F | Ht 65.0 in | Wt 221.0 lb

## 2017-04-25 DIAGNOSIS — R059 Cough, unspecified: Secondary | ICD-10-CM

## 2017-04-25 DIAGNOSIS — R05 Cough: Secondary | ICD-10-CM

## 2017-04-25 DIAGNOSIS — J209 Acute bronchitis, unspecified: Secondary | ICD-10-CM

## 2017-04-25 HISTORY — DX: Acute bronchitis, unspecified: J20.9

## 2017-04-25 MED ORDER — AZITHROMYCIN 250 MG PO TABS: ORAL_TABLET | ORAL | 0 refills | Status: DC

## 2017-04-25 NOTE — Patient Instructions (Signed)
I have prescribed you an antibiotic to cover for a bacterial upper respiratory infection and pneumonia.  I will call you with the results of the X-ray. It typically takes several hours for it to be read.   Acute Bronchitis, Adult Acute bronchitis is when air tubes (bronchi) in the lungs suddenly get swollen. The condition can make it hard to breathe. It can also cause these symptoms:  A cough.  Coughing up clear, yellow, or green mucus.  Wheezing.  Chest congestion.  Shortness of breath.  A fever.  Body aches.  Chills.  A sore throat.  Follow these instructions at home: Medicines  Take over-the-counter and prescription medicines only as told by your doctor.  If you were prescribed an antibiotic medicine, take it as told by your doctor. Do not stop taking the antibiotic even if you start to feel better. General instructions  Rest.  Drink enough fluids to keep your pee (urine) clear or pale yellow.  Avoid smoking and secondhand smoke. If you smoke and you need help quitting, ask your doctor. Quitting will help your lungs heal faster.  Use an inhaler, cool mist vaporizer, or humidifier as told by your doctor.  Keep all follow-up visits as told by your doctor. This is important. How is this prevented? To lower your risk of getting this condition again:  Wash your hands often with soap and water. If you cannot use soap and water, use hand sanitizer.  Avoid contact with people who have cold symptoms.  Try not to touch your hands to your mouth, nose, or eyes.  Make sure to get the flu shot every year.  Contact a doctor if:  Your symptoms do not get better in 2 weeks. Get help right away if:  You cough up blood.  You have chest pain.  You have very bad shortness of breath.  You become dehydrated.  You faint (pass out) or keep feeling like you are going to pass out.  You keep throwing up (vomiting).  You have a very bad headache.  Your fever or chills  gets worse. This information is not intended to replace advice given to you by your health care provider. Make sure you discuss any questions you have with your health care provider. Document Released: 04/18/2008 Document Revised: 06/08/2016 Document Reviewed: 04/20/2016 Elsevier Interactive Patient Education  2017 [employer].

## 2017-04-25 NOTE — Assessment & Plan Note (Signed)
History and exam consistent for at least bronchitis, would like to rule out pneumonia given duration of symptoms and possible faint crackles on exam.  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 No h/o bleeding. Has multiple allergies to antibiotics. Will start azithromycin, pt has good renal function so I doubt it would interfere too much with Xarelto concentration. Discussed watching for increase bruising / bleeding.   Will get an X-ray. Discussed follow up precautions.

## 2017-04-25 NOTE — Progress Notes (Signed)
Subjective: CC: respiratory symptoms HPI: Patient is a 61 y.o. female with a past medical history of obesity, HTN, fagtigue, on anticoagulation (however I am not sure why from PCP notes) presenting to clinic today for a same day appt for URI symptoms.  Patient has had "bronchial issues" all her life. Would previously come to the doctor's office for these symptoms, would waste her time to be told she needed to take cough medicine so she was trying to prevent coming.   Symptoms started early May as sore throat, cough, congestion, raspy voice, ears feeling clogged.  Late April thought she got better, but then woke up with a sore throat again early June.  Never got completely better per her report.  Nagging cough, productive: grey, green.  Trouble breathing without coughing because she feels something is in her lungs. DOE and racing heart rate when she exerts herself.  Currently has cough, mild nasal congestion, mild chest congestion, DOE, ears feeling clogged. Sore throat has resolved.  No fevers, chills, chest pain, rhinorrhea, facial pain, headache.  Tried decongetants, Robitussin, cough drop.  No known sick contacts  Social History: never smoker   ROS: All other systems reviewed and are negative.  Past Medical History Patient Active Problem List   Diagnosis Date Noted  . Acute bronchitis 04/25/2017  . Preventative health care 12/21/2015  . Ulcer of ankle ([place]) 05/27/2014  . Other malaise and fatigue 01/01/2014  . Long term (current) use of anticoagulants 12/25/2010  . ESSENTIAL HYPERTENSION, BENIGN 05/03/2010  . OBESITY 02/07/2008    Medications- reviewed and updated Current Outpatient Prescriptions  Medication Sig Dispense Refill  . azithromycin (ZITHROMAX) 250 MG tablet Take 500mg  (2 tablets) today, then 250mg  (1 tablet) the additional 4 days 6 each 0  . hydrochlorothiazide (HYDRODIURIL) 25 MG tablet TAKE 1 TABLET BY MOUTH ONCE DAILY 90 tablet 2  .  HYDROcodone-acetaminophen (NORCO) 5-325 MG tablet Take 1 tablet by mouth every 6 (six) hours as needed for severe pain. 6 tablet 0  . rivaroxaban (XARELTO) 20 MG TABS tablet Take 1 tablet (20 mg total) by mouth daily with supper. 90 tablet 3   No current facility-administered medications for this visit.     Objective: Office vital signs reviewed. BP 128/70 (BP Location: Right Arm, Patient Position: Sitting, Cuff Size: Large)   Pulse (!) 58   Temp 97.7 F (36.5 C) (Oral)   Ht 5\' 5"  (1.651 m)   Wt 221 lb (100.2 kg)   SpO2 97%   BMI 36.78 kg/m    Physical Examination:  General: Awake, alert, well- nourished, NAD ENMT:  TMs intact, normal light reflex, no erythema, no bulging. Nasal turbinates moist. MMM, Oropharynx clear without erythema or tonsillar exudate/hypertrophy Eyes: Conjunctiva non-injected. PERRL.  No maxillary or frontal tenderness.  Neck: shotty LAD. Cardio: RRR, no m/r/g noted.  Pulm: No increased WOB, however significant cough with most deep breathes.  Possible faint crackle in right lung field, no wheezing or rhonchi.  Assessment/Plan: Acute bronchitis History and exam consistent for at least bronchitis, would like to rule out pneumonia given duration of symptoms and possible faint crackles on exam.  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 No h/o bleeding. Has multiple allergies to antibiotics. Will start azithromycin, pt has good renal function so I doubt it would interfere too much with Xarelto concentration. Discussed watching for increase bruising / bleeding.   Will get an X-ray. Discussed follow up precautions.   Orders Placed This Encounter  Procedures  . DG Chest  2 View    Standing Status:   Future    Number of Occurrences:   1    Standing Expiration Date:   06/25/2018    Order Specific Question:   Reason for Exam (SYMPTOM  OR DIAGNOSIS REQUIRED)    Answer:   cough, DOE x 1 week    Order Specific Question:   Preferred imaging location?    Answer:   [facility]    Order Specific Question:   Radiology Contrast Protocol - do NOT remove file path    Answer:   [email or web address]    Meds ordered this encounter  Medications  . azithromycin (ZITHROMAX) 250 MG tablet    Sig: Take 500mg  (2 tablets) today, then 250mg  (1 tablet) the additional 4 days    Dispense:  6 each    Refill:  [place] [facility] PGY-3, [employer]

## 2017-04-25 NOTE — Telephone Encounter (Signed)
Called to discuss with patient that CXR was normal.  She discussed appreciation for the call.  Archie Patten, MD [facility] Resident  04/25/2017, 10:06 AM

## 2017-07-08 ENCOUNTER — Other Ambulatory Visit: Payer: Self-pay | Admitting: Obstetrics and Gynecology

## 2017-07-20 ENCOUNTER — Telehealth: Payer: Self-pay | Admitting: Family Medicine

## 2017-07-20 NOTE — Telephone Encounter (Signed)
Pt is calling because she has started a new job and her insurance will not be in effect till late October. She takes Xarelto. She has 3 weeks worth at this time and she can not afford this without insurance, do we have any options with samples to hold her over until her insurance kicks in. Please call patient to discuss, jw

## 2017-07-20 NOTE — Telephone Encounter (Signed)
How much would she need and I can see if we have samples to accomodate.

## 2017-07-21 NOTE — Telephone Encounter (Signed)
Page she takes 20 mg tablets and it looks like she would need about a month's worth.

## 2017-07-31 NOTE — Telephone Encounter (Signed)
Patient left message on nurse line asking for update on Xarelto samples. She may be reached at [phone]. Hubbard Hartshorn, RN, BSN

## 2017-08-01 NOTE — Telephone Encounter (Signed)
Xarelto samples left for pt up front, she has been notified of this.   Samples: Xarelto 20mg  #7 tablets, x4bottles  Lot: [id number] Ex: 02/12/2020

## 2017-09-18 ENCOUNTER — Other Ambulatory Visit: Payer: Self-pay | Admitting: Obstetrics and Gynecology

## 2017-12-25 ENCOUNTER — Ambulatory Visit: Payer: 59 | Admitting: Family Medicine

## 2017-12-25 ENCOUNTER — Other Ambulatory Visit: Payer: Self-pay

## 2017-12-25 ENCOUNTER — Encounter: Payer: Self-pay | Admitting: Family Medicine

## 2017-12-25 VITALS — BP 140/72 | HR 68 | Temp 98.3°F | Ht 65.0 in | Wt 235.2 lb

## 2017-12-25 DIAGNOSIS — R51 Headache: Secondary | ICD-10-CM

## 2017-12-25 DIAGNOSIS — R42 Dizziness and giddiness: Secondary | ICD-10-CM

## 2017-12-25 DIAGNOSIS — R519 Headache, unspecified: Secondary | ICD-10-CM

## 2017-12-25 NOTE — Progress Notes (Signed)
    Subjective:  Michele Morrison is a 62 y.o. female who presents to the [facility] today with a chief complaint of dizziness and headache.   HPI:  DIZZINESS  Feeling dizzy for 2 days. Dizziness described as:  "equilibrium is off..feels lightheaded" Feels like room spins: no Lightheadedness when stands: not always Palpitations or heart racing: yes, intermittently Prior dizziness: yes, but not as severe Medications tried: none Patient believes might be causing their pain: though d/t high blood pressure   Taking blood thinners: xarelto, never misses doses (on for mutiple DVT) last in 2012  Symptoms Hearing Loss: no Ear Pain or fullness: yes Nausea or vomiting: nausea Vision difficulty or double vision: no Falls: no Head trauma: no Weakness in arm or leg: no Speaking problems: no Headache: 2 weeks of frontal HA that started abruptly.   ROS: no weakness, changes in vision, numbness or tingling in her extremities Smoking Status noted  PMH: DVT on xarelto,  Tobacco use: non-smoker Medication: reviewed and updated ROS: see HPI   Objective:  Physical Exam: BP 140/72   Pulse 68   Temp 98.3 F (36.8 C) (Oral)   Ht 5\' 5"  (1.651 m)   Wt 235 lb 3.2 oz (106.7 kg)   SpO2 95%   BMI 39.14 kg/m   Gen: 61yo F in NAD, resting comfortably HEENT: AT/[place], TMs clear bilaterally, with what appears to be a scar on the L, optic disc appears normal, no nasal drainage or edema, clear oropharynx CV: RRR with no murmurs appreciated Pulm: NWOB, CTAB with no crackles, wheezes, or rhonchi GI: Normal bowel sounds present. Soft, Nontender, Nondistended. MSK: no edema, cyanosis, or clubbing noted Skin: warm, dry Neuro: CN2-12 WNL, no changes in sensation, strength 5/5 throughout Psych: Normal affect and thought content  No results found for this or any previous visit (from the past 72 hour(s)).  Orthostatic VS for the past 24 hrs:  BP- Lying Pulse- Lying BP- Sitting Pulse- Sitting BP- Standing at 0  minutes Pulse- Standing at 0 minutes  12/25/17 1026 142/70 53 138/78 68 134/78 73     Assessment/Plan:  Headache and dizziness Patient presented with headache in the frontal region without focal neurological deficits for the past 2 weeks.  Dix-Hallpike did not elicit any symptoms or nystagmus and orthostatic vital signs were normal. She has not tried any over-the-counter medication.  She has no red flag signs or symptoms, aside from de novo headache.  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  There is always a concern for acute bleed, however I am reassured that she has no deficits or red flags and will hold off on imaging at this time. Recommended the patient try Tylenol.  She has not seen an ophthalmologist in several years and I recommended that she follow-up for routine eye exam.  Discussed strict return precautions.  Recommended that she follow-up with her primary doctor in 1 week.  Daniel L. Rosalyn Gess, [place] Resident PGY-2 12/26/2017 12:41 PM

## 2017-12-25 NOTE — Patient Instructions (Addendum)
Michele Morrison, you were seen today for headache, dizziness and lightheadedness.   I was unable to find any abnormalities on exam.  Take the first step would be reasonable to try and take some Tylenol to see if this relieves your symptoms.  If you develop any further symptoms please make an appointment immediately.  Develop any weakness in your arms or legs, changes in vision, slurring of speech, chest pain or shortness of breath.  Please follow-up with Dr. Vanetta Shawl in 1-2 weeks for follow-up.  Oretha Weismann L. Rosalyn Gess, [place] Resident PGY-2 12/25/2017 10:46 AM

## 2017-12-26 ENCOUNTER — Encounter: Payer: Self-pay | Admitting: Family Medicine

## 2018-01-08 DIAGNOSIS — R42 Dizziness and giddiness: Secondary | ICD-10-CM | POA: Diagnosis not present

## 2018-01-08 DIAGNOSIS — H905 Unspecified sensorineural hearing loss: Secondary | ICD-10-CM | POA: Diagnosis not present

## 2018-01-16 DIAGNOSIS — R42 Dizziness and giddiness: Secondary | ICD-10-CM | POA: Diagnosis not present

## 2018-01-19 DIAGNOSIS — R42 Dizziness and giddiness: Secondary | ICD-10-CM | POA: Diagnosis not present

## 2018-01-28 ENCOUNTER — Encounter: Payer: Self-pay | Admitting: Internal Medicine

## 2018-04-23 DIAGNOSIS — M1711 Unilateral primary osteoarthritis, right knee: Secondary | ICD-10-CM | POA: Diagnosis not present

## 2018-04-24 DIAGNOSIS — M1711 Unilateral primary osteoarthritis, right knee: Secondary | ICD-10-CM | POA: Diagnosis not present

## 2018-05-14 DIAGNOSIS — M1711 Unilateral primary osteoarthritis, right knee: Secondary | ICD-10-CM | POA: Diagnosis not present

## 2018-05-16 DIAGNOSIS — M1711 Unilateral primary osteoarthritis, right knee: Secondary | ICD-10-CM | POA: Diagnosis not present

## 2018-05-23 DIAGNOSIS — M1711 Unilateral primary osteoarthritis, right knee: Secondary | ICD-10-CM | POA: Diagnosis not present

## 2018-05-31 DIAGNOSIS — M1711 Unilateral primary osteoarthritis, right knee: Secondary | ICD-10-CM | POA: Diagnosis not present

## 2018-06-27 ENCOUNTER — Encounter: Payer: Self-pay | Admitting: *Deleted

## 2018-06-28 ENCOUNTER — Encounter: Payer: Self-pay | Admitting: Internal Medicine

## 2018-06-28 ENCOUNTER — Telehealth: Payer: Self-pay | Admitting: *Deleted

## 2018-06-28 ENCOUNTER — Ambulatory Visit: Payer: 59 | Admitting: Internal Medicine

## 2018-06-28 VITALS — BP 138/80 | HR 68 | Ht 64.0 in | Wt 247.6 lb

## 2018-06-28 DIAGNOSIS — R140 Abdominal distension (gaseous): Secondary | ICD-10-CM

## 2018-06-28 DIAGNOSIS — K30 Functional dyspepsia: Secondary | ICD-10-CM | POA: Diagnosis not present

## 2018-06-28 DIAGNOSIS — Z86010 Personal history of colonic polyps: Secondary | ICD-10-CM | POA: Diagnosis not present

## 2018-06-28 DIAGNOSIS — Z800 Family history of malignant neoplasm of digestive organs: Secondary | ICD-10-CM | POA: Diagnosis not present

## 2018-06-28 MED ORDER — PANTOPRAZOLE SODIUM 40 MG PO TBEC: 40.0000 mg | DELAYED_RELEASE_TABLET | Freq: Every day | ORAL | 1 refills | Status: DC

## 2018-06-28 MED ORDER — SUPREP BOWEL PREP KIT 17.5-3.13-1.6 GM/177ML PO SOLN: 1.0000 | ORAL | 0 refills | Status: DC

## 2018-06-28 NOTE — Patient Instructions (Signed)
You have been scheduled for an endoscopy and colonoscopy. Please follow the written instructions given to you at your visit today. Please pick up your prep supplies at the pharmacy within the next 1-3 days. If you use inhalers (even only as needed), please bring them with you on the day of your procedure. Your physician has requested that you go to www.startemmi.com and enter the access code given to you at your visit today. This web site gives a general overview about your procedure. However, you should still follow specific instructions given to you by our office regarding your preparation for the procedure.  You will be contaced by our office prior to your procedure for directions on holding your Xarelto.  If you do not hear from our office 1 week prior to your scheduled procedure, please call [phone] to discuss.  We have sent the following medications to your pharmacy for you to pick up at your convenience: Pantoprazole 40 mg daily  Please call or MyChart our office after 1 month of taking Pantoprazole to let us know how your indigestion is doing.  If you are age 53 or older, your body mass index should be between 23-30. Your Body mass index is 42.5 kg/m. If this is out of the aforementioned range listed, please consider follow up with your Primary Care Provider.  If you are age 20 or younger, your body mass index should be between 19-25. Your Body mass index is 42.5 kg/m. If this is out of the aformentioned range listed, please consider follow up with your Primary Care Provider.

## 2018-06-28 NOTE — Telephone Encounter (Signed)
   Danali Marinos [place] [birth year] [id number]  Dear Dr Vanetta Shawl:  We have scheduled the above named patient for a(n) endoscopy/colonoscopy procedure. Our records show that (s)he is on anticoagulation therapy.  Please advise as to whether the patient may come off their therapy of xarelto 2 days prior to their procedure which is scheduled for 08/20/18.  Please route your response to Dixon Boos, CMA.  Sincerely,   [facility]

## 2018-06-28 NOTE — Progress Notes (Addendum)
Patient ID: Michele Morrison, female   DOB: [birth year], 62 y.o.   MRN: [id number] HPI: Michele Morrison is a 62 year old female with a past medical history of sessile serrated colon polyps, family history of colon cancer in her mother, history of DVT on Xarelto who is seen to evaluate indigestion and upper abdominal bloating and fullness with nausea and also to discuss surveillance colonoscopy.  She is here alone today.  She reports that over the last 6 months or so she has noticed indigestion, worse after eating.  Can be associated with nausea, fullness, upper abdominal bloating.  No true heartburn.  No vomiting.  No dysphagia.  Been avoiding foods like tomatoes and coffee which has not helped that much.  Has gained about 10 pounds in the last 6 months though the weight gain seem to start before her indigestion symptoms.  Bowel movements have been daily but inconsistent and she feels changed with phases of the moon.  She can vary from constipation to loose stools and at other times more normal bowel movements.  She is not having mid or lower abdominal pain.  She has had no blood in her stool or melena.  Her last colonoscopy was performed on 01/14/2015 and was complete to the terminal ileum.  5 polyps ranging from 5 to 9 mm were removed from the cecum, descending colon and sigmoid colon.  These were found to be sessile serrated polyps and several were hyperplastic.  There was moderate diverticulosis from transverse to sigmoid colon.  3-year recall was recommended after that procedure  Past Medical History:  Diagnosis Date  . Diverticulosis   . H/O blood clots   . HTN (hypertension)   . Kidney stones     Past Surgical History:  Procedure Laterality Date  . ROTATOR CUFF REPAIR Right   . TOTAL KNEE ARTHROPLASTY Left 2012    Outpatient Medications Prior to Visit  Medication Sig Dispense Refill  . hydrochlorothiazide (HYDRODIURIL) 25 MG tablet TAKE 1 TABLET BY MOUTH ONCE DAILY 90 tablet 2  . XARELTO 20 MG  TABS tablet TAKE 1 TABLET BY MOUTH EVERY DAY WITH SUPPER 90 tablet 2   No facility-administered medications prior to visit.     Allergies  Allergen Reactions  . Cephalexin Rash    REACTION: unspecified  . Penicillins Other (See Comments)    unknown    Family History  Problem Relation Age of Onset  . Colon cancer Mother 48  . Coronary artery disease Mother   . Cancer Father        Non-Hodgkin's lymphoma  . Coronary artery disease Father   . Stomach cancer Paternal Grandfather        pt unsure  . Esophageal cancer Neg Hx   . Rectal cancer Neg Hx     Social History   Tobacco Use  . Smoking status: Never Smoker  . Smokeless tobacco: Never Used  Substance Use Topics  . Alcohol use: Yes    Alcohol/week: 0.0 standard drinks    Comment: rarely  . Drug use: No    ROS: As per history of present illness, otherwise negative  BP 138/80   Pulse 68   Ht 5\' 4"  (1.626 m)   Wt 247 lb 9.6 oz (112.3 kg)   BMI 42.50 kg/m  Constitutional: Well-developed and well-nourished. No distress. HEENT: Normocephalic and atraumatic.  No scleral icterus. Neck: Neck supple. Trachea midline. Cardiovascular: Normal rate, regular rhythm and intact distal pulses. No M/R/G Pulmonary/chest: Effort normal and breath sounds normal.  No wheezing, rales or rhonchi. Abdominal: Soft, obese, epigastric tenderness with deep palpation without rebound or guarding, nondistended. Bowel sounds active throughout.  Extremities: no clubbing, cyanosis, trace pretib edema at ankle Neurological: Alert and oriented to person place and time. Skin: Skin is warm and dry.  Psychiatric: Normal mood and affect. Behavior is normal.  RELEVANT LABS AND IMAGING: CBC    Component Value Date/Time   WBC 10.3 01/25/2017 0200   RBC 4.65 01/25/2017 0200   HGB 13.9 01/25/2017 0200   HCT 43.1 01/25/2017 0200   PLT 207 01/25/2017 0200   MCV 92.7 01/25/2017 0200   MCH 29.9 01/25/2017 0200   MCHC 32.3 01/25/2017 0200   RDW 13.4  01/25/2017 0200   LYMPHSABS 2.5 01/25/2017 0200   MONOABS 1.0 01/25/2017 0200   EOSABS 0.2 01/25/2017 0200   BASOSABS 0.0 01/25/2017 0200    CMP     Component Value Date/Time   NA 137 01/25/2017 0200   K 3.5 01/25/2017 0200   CL 103 01/25/2017 0200   CO2 26 01/25/2017 0200   GLUCOSE 124 (H) 01/25/2017 0200   BUN 18 01/25/2017 0200   CREATININE 0.63 01/25/2017 0200   CREATININE 0.66 12/08/2014 1426   CALCIUM 10.3 01/25/2017 0200   PROT 7.7 01/25/2017 0200   ALBUMIN 4.2 01/25/2017 0200   AST 19 01/25/2017 0200   ALT 19 01/25/2017 0200   ALKPHOS 131 (H) 01/25/2017 0200   BILITOT 0.7 01/25/2017 0200   GFRNONAA >60 01/25/2017 0200   GFRAA >60 01/25/2017 0200    ASSESSMENT/PLAN: 62 year old female with a past medical history of sessile serrated colon polyps, family history of colon cancer in her mother, history of DVT on Xarelto who is seen to evaluate indigestion and upper abdominal bloating and fullness with nausea and also to discuss surveillance colonoscopy.    1.  Indigestion/nausea/upper abdominal bloating and discomfort --sounds acid peptic in nature, cannot exclude gastritis.  We will try a pantoprazole 40 mg once daily, 30 minutes before breakfast x1 month.  If not better I recommend proceeding to upper endoscopy which can be performed at the same time as her colonoscopy.  I asked that she notify me after about 1 month to let me know if symptoms improve or not.  2.  History of sessile serrated polyp/family history of colon cancer --due for surveillance colonoscopy at this time.  We discussed the risk, benefits and alternatives and she is agreeable and wishes to proceed.  We will need to hold Xarelto 2 days before this and will contact her primary care provider for permission to do so.  Will hold Xarelto to days prior to endoscopic procedures - will instruct when and how to resume after procedure. Benefits and risks of procedure explained including risks of bleeding,  perforation, infection, missed lesions, reactions to medications and possible need for hospitalization and surgery for complications. Additional rare but real risk of stroke or other vascular clotting events off the relative also explained and need to seek urgent help if any signs of these problems occur. Will communicate by phone or EMR with patient's  prescribing provider to confirm that holding Xarelto is reasonable in this case.      FY:BOFBPZ, Lytle Michaels [place], [place] [address]   Addendum Patient presented for procedures today, 08/20/2018, and reports that all of her upper GI symptoms as discussed at her last office visit have resolved.  She only needed pantoprazole for may be a week and has stopped this medication.  No further indigestion, nausea, upper abdominal bloating or discomfort.  She request the upper endoscopy be canceled.  With the absence of symptoms I feel this is reasonable.  We will proceed with surveillance colonoscopy today.

## 2018-06-29 NOTE — Telephone Encounter (Signed)
I have spoken to patient and advised that per Dr Vanetta Shawl, she may hold xarelto 48 hours prior to procedure. Patient verbalizes understanding.

## 2018-07-20 ENCOUNTER — Other Ambulatory Visit: Payer: Self-pay | Admitting: Internal Medicine

## 2018-07-23 DIAGNOSIS — M545 Low back pain: Secondary | ICD-10-CM | POA: Diagnosis not present

## 2018-07-25 DIAGNOSIS — M5416 Radiculopathy, lumbar region: Secondary | ICD-10-CM | POA: Diagnosis not present

## 2018-08-06 DIAGNOSIS — M5416 Radiculopathy, lumbar region: Secondary | ICD-10-CM | POA: Diagnosis not present

## 2018-08-08 ENCOUNTER — Encounter: Payer: Self-pay | Admitting: Internal Medicine

## 2018-08-20 ENCOUNTER — Encounter: Payer: Self-pay | Admitting: Internal Medicine

## 2018-08-20 ENCOUNTER — Ambulatory Visit (AMBULATORY_SURGERY_CENTER): Payer: 59 | Admitting: Internal Medicine

## 2018-08-20 VITALS — BP 133/76 | HR 60 | Temp 99.1°F | Resp 24 | Ht 64.0 in | Wt 247.0 lb

## 2018-08-20 DIAGNOSIS — D120 Benign neoplasm of cecum: Secondary | ICD-10-CM

## 2018-08-20 DIAGNOSIS — Z86010 Personal history of colonic polyps: Secondary | ICD-10-CM

## 2018-08-20 DIAGNOSIS — K635 Polyp of colon: Secondary | ICD-10-CM

## 2018-08-20 DIAGNOSIS — K621 Rectal polyp: Secondary | ICD-10-CM | POA: Diagnosis not present

## 2018-08-20 DIAGNOSIS — D127 Benign neoplasm of rectosigmoid junction: Secondary | ICD-10-CM

## 2018-08-20 DIAGNOSIS — D129 Benign neoplasm of anus and anal canal: Secondary | ICD-10-CM

## 2018-08-20 DIAGNOSIS — D126 Benign neoplasm of colon, unspecified: Secondary | ICD-10-CM | POA: Diagnosis not present

## 2018-08-20 DIAGNOSIS — D122 Benign neoplasm of ascending colon: Secondary | ICD-10-CM

## 2018-08-20 DIAGNOSIS — D128 Benign neoplasm of rectum: Secondary | ICD-10-CM

## 2018-08-20 HISTORY — PX: COLONOSCOPY: SHX174

## 2018-08-20 MED ORDER — SODIUM CHLORIDE 0.9 % IV SOLN: 500.0000 mL | Freq: Once | INTRAVENOUS | Status: DC

## 2018-08-20 NOTE — Progress Notes (Signed)
Pt.state" I don't need the egd any more,I am much better since I seen him".Dr. Hilarie Fredrickson made aware of pt. Request.

## 2018-08-20 NOTE — Patient Instructions (Signed)
*  handout on polyps given, resume xarelto today*   YOU HAD AN ENDOSCOPIC PROCEDURE TODAY AT [facility]:   Refer to the procedure report that was given to you for any specific questions about what was found during the examination.  If the procedure report does not answer your questions, please call your gastroenterologist to clarify.  If you requested that your care partner not be given the details of your procedure findings, then the procedure report has been included in a sealed envelope for you to review at your convenience later.  YOU SHOULD EXPECT: Some feelings of bloating in the abdomen. Passage of more gas than usual.  Walking can help get rid of the air that was put into your GI tract during the procedure and reduce the bloating. If you had a lower endoscopy (such as a colonoscopy or flexible sigmoidoscopy) you may notice spotting of blood in your stool or on the toilet paper. If you underwent a bowel prep for your procedure, you may not have a normal bowel movement for a few days.  Please Note:  You might notice some irritation and congestion in your nose or some drainage.  This is from the oxygen used during your procedure.  There is no need for concern and it should clear up in a day or so.  SYMPTOMS TO REPORT IMMEDIATELY:   Following lower endoscopy (colonoscopy or flexible sigmoidoscopy):  Excessive amounts of blood in the stool  Significant tenderness or worsening of abdominal pains  Swelling of the abdomen that is new, acute  Fever of 100F or higher   For urgent or emergent issues, a gastroenterologist can be reached at any hour by calling [phone].   DIET:  We do recommend a small meal at first, but then you may proceed to your regular diet.  Drink plenty of fluids but you should avoid alcoholic beverages for 24 hours.  ACTIVITY:  You should plan to take it easy for the rest of today and you should NOT DRIVE or use heavy machinery until tomorrow  (because of the sedation medicines used during the test).    FOLLOW UP: Our staff will call the number listed on your records the next business day following your procedure to check on you and address any questions or concerns that you may have regarding the information given to you following your procedure. If we do not reach you, we will leave a message.  However, if you are feeling well and you are not experiencing any problems, there is no need to return our call.  We will assume that you have returned to your regular daily activities without incident.  If any biopsies were taken you will be contacted by phone or by letter within the next 1-3 weeks.  Please call us at [phone] if you have not heard about the biopsies in 3 weeks.    SIGNATURES/CONFIDENTIALITY: You and/or your care partner have signed paperwork which will be entered into your electronic medical record.  These signatures attest to the fact that that the information above on your After Visit Summary has been reviewed and is understood.  Full responsibility of the confidentiality of this discharge information lies with you and/or your care-partner.

## 2018-08-20 NOTE — Progress Notes (Signed)
Called to room to assist during endoscopic procedure.  Patient ID and intended procedure confirmed with present staff. Received instructions for my participation in the procedure from the performing physician.  

## 2018-08-20 NOTE — Op Note (Signed)
[facility] Patient Name: Michele Morrison Procedure Date: 08/20/2018 12:47 PM MRN: [id number] Endoscopist: Jerene Bears , MD Age: 62 Referring MD:  Date of Birth: [birth year] Gender: Female Account #: [id number] Procedure:                Colonoscopy Indications:              High risk colon cancer surveillance: Personal                            history of sessile serrated colon polyp (less than                            10 mm in size) with no dysplasia, Last colonoscopy                            3 years ago Medicines:                Monitored Anesthesia Care Procedure:                Pre-Anesthesia Assessment:                           - Prior to the procedure, a History and Physical                            was performed, and patient medications and                            allergies were reviewed. The patient's tolerance of                            previous anesthesia was also reviewed. The risks                            and benefits of the procedure and the sedation                            options and risks were discussed with the patient.                            All questions were answered, and informed consent                            was obtained. Prior Anticoagulants: The patient has                            taken Xarelto (rivaroxaban), last dose was 2 days                            prior to procedure. ASA Grade Assessment: III - A                            patient with severe systemic disease. After  reviewing the risks and benefits, the patient was                            deemed in satisfactory condition to undergo the                            procedure.                           After obtaining informed consent, the colonoscope                            was passed under direct vision. Throughout the                            procedure, the patient's blood pressure, pulse, and                            oxygen  saturations were monitored continuously. The                            Model PCF-H190DL [id number]) scope was introduced                            through the anus and advanced to the cecum,                            identified by appendiceal orifice and ileocecal                            valve. The colonoscopy was performed without                            difficulty. The patient tolerated the procedure                            well. The quality of the bowel preparation was                            good. The ileocecal valve, appendiceal orifice, and                            rectum were photographed. Scope In: 1:46:32 PM Scope Out: 2:04:50 PM Scope Withdrawal Time: 0 hours 15 minutes 13 seconds  Total Procedure Duration: 0 hours 18 minutes 18 seconds  Findings:                 The digital rectal exam was normal.                           A 5 mm polyp was found in the cecum. The polyp was                            sessile. The polyp was removed with a cold snare.  Resection and retrieval were complete.                           A 5 mm polyp was found in the ascending colon. The                            polyp was sessile. The polyp was removed with a                            cold snare. Resection and retrieval were complete.                           Two sessile polyps were found in the rectum and                            distal sigmoid colon. The polyps were 4 to 6 mm in                            size. These polyps were removed with a cold snare.                            Resection and retrieval were complete.                           Multiple small and large-mouthed diverticula were                            found in the sigmoid colon, descending colon and                            transverse colon.                           The retroflexed view of the distal rectum and anal                            verge was normal and showed no anal  or rectal                            abnormalities. Complications:            No immediate complications. Estimated Blood Loss:     Estimated blood loss was minimal. Impression:               - One 5 mm polyp in the cecum, removed with a cold                            snare. Resected and retrieved.                           - One 5 mm polyp in the ascending colon, removed                            with a cold  snare. Resected and retrieved.                           - Two 4 to 6 mm polyps in the rectum and in the                            distal sigmoid colon, removed with a cold snare.                            Resected and retrieved.                           - Severe diverticulosis in the sigmoid colon, in                            the descending colon and in the transverse colon.                           - The distal rectum and anal verge are normal on                            retroflexion view. Recommendation:           - Patient has a contact number available for                            emergencies. The signs and symptoms of potential                            delayed complications were discussed with the                            patient. Return to normal activities tomorrow.                            Written discharge instructions were provided to the                            patient.                           - Resume previous diet.                           - Continue present medications.                           - Await pathology results.                           - Repeat colonoscopy is recommended for                            surveillance. The colonoscopy date will be  determined after pathology results from today's                            exam become available for review. Jerene Bears, MD 08/20/2018 2:27:55 PM This report has been signed electronically.

## 2018-08-20 NOTE — Progress Notes (Signed)
Report given to PACU, vss 

## 2018-08-21 ENCOUNTER — Telehealth: Payer: Self-pay

## 2018-08-21 NOTE — Telephone Encounter (Signed)
  Follow up Call-  Call back number 08/20/2018  Post procedure Call Back phone  # [phone]  Permission to leave phone message Yes  Some recent data might be hidden     Patient questions:  Do you have a fever, pain , or abdominal swelling? No. Pain Score  0 *  Have you tolerated food without any problems? Yes.    Have you been able to return to your normal activities? Yes.    Do you have any questions about your discharge instructions: Diet   No. Medications  No. Follow up visit  No.  Do you have questions or concerns about your Care? No.  Actions: * If pain score is 4 or above: No action needed, pain <4.

## 2018-08-21 NOTE — Telephone Encounter (Signed)
No answer, left message to call back later today, B.Rondle Lohse RN. 

## 2018-08-23 ENCOUNTER — Encounter: Payer: Self-pay | Admitting: Internal Medicine

## 2018-09-05 ENCOUNTER — Other Ambulatory Visit: Payer: Self-pay | Admitting: Family Medicine

## 2018-10-19 DIAGNOSIS — M1711 Unilateral primary osteoarthritis, right knee: Secondary | ICD-10-CM | POA: Diagnosis not present

## 2018-12-06 ENCOUNTER — Other Ambulatory Visit: Payer: Self-pay | Admitting: Family Medicine

## 2018-12-06 NOTE — Telephone Encounter (Signed)
Spoke with patient and she was agreeable to making an appointment but she is only able to come on Mondays.  This upcoming Monday was the preferred choice for her.  Made appointment 12/10/2018 with Dr. Burr Medico at 2:30pm.  Johnney Ou

## 2018-12-06 NOTE — Telephone Encounter (Signed)
Needs follow up appt for HTN, other chronic issues. Will give 1 month supply

## 2018-12-07 ENCOUNTER — Other Ambulatory Visit: Payer: Self-pay | Admitting: Orthopaedic Surgery

## 2018-12-10 ENCOUNTER — Encounter: Payer: Self-pay | Admitting: Family Medicine

## 2018-12-10 ENCOUNTER — Ambulatory Visit: Payer: 59 | Admitting: Family Medicine

## 2018-12-10 ENCOUNTER — Other Ambulatory Visit: Payer: Self-pay

## 2018-12-10 VITALS — BP 128/74 | HR 78 | Temp 97.9°F | Wt 243.0 lb

## 2018-12-10 DIAGNOSIS — I10 Essential (primary) hypertension: Secondary | ICD-10-CM | POA: Diagnosis not present

## 2018-12-10 DIAGNOSIS — R739 Hyperglycemia, unspecified: Secondary | ICD-10-CM

## 2018-12-10 DIAGNOSIS — B3731 Acute candidiasis of vulva and vagina: Secondary | ICD-10-CM

## 2018-12-10 DIAGNOSIS — B373 Candidiasis of vulva and vagina: Secondary | ICD-10-CM

## 2018-12-10 LAB — POCT GLYCOSYLATED HEMOGLOBIN (HGB A1C): HbA1c, POC (prediabetic range): 5.7 % (ref 5.7–6.4)

## 2018-12-10 MED ORDER — FLUCONAZOLE 150 MG PO TABS: ORAL_TABLET | ORAL | 0 refills | Status: DC

## 2018-12-10 NOTE — Progress Notes (Signed)
  Subjective:    Patient ID: Michele Morrison, female    DOB: 1956-06-13, 63 y.o.   MRN: [id number]   CC: Blood pressure check  HPI:  Hypertension: - Medications: HCTZ 25 - Compliance: Yes - Checking BP at home: No - Denies any SOB, CP, vision changes, LE edema, medication SEs, or symptoms of hypotension -Patient coming in to have a blood pressure check as she is having a total knee replacement in February.  States she has not have any problems but her blood pressure normally runs high and she wants to make sure that it is good before her surgery  Vaginal Discharge - has been ongoing for 7 days  - Denies burning, abdominal pain, nausea or vomiting, reports itching - Discharge described as thick and white.  - Patient reports no STD in the past.  - Denies burning with urination, no hematuria.  - Patient reports that she has had a yeast infection in the past and this feels like that.  She has tried over-the-counter medications but is still having symptoms and is requesting treatment.    - Patient denies any recent antibiotic use or change in behavior.   Smoking status reviewed  ROS: 10 point ROS is otherwise negative, except as mentioned in HPI  Patient Active Problem List   Diagnosis Date Noted  . Vaginal yeast infection 12/11/2018  . Acute bronchitis 04/25/2017  . Preventative health care 12/21/2015  . Ulcer of ankle ([place]) 05/27/2014  . Other malaise and fatigue 01/01/2014  . Long term (current) use of anticoagulants 12/25/2010  . ESSENTIAL HYPERTENSION, BENIGN 05/03/2010  . OBESITY 02/07/2008     Objective:  BP 128/74   Pulse 78   Temp 97.9 F (36.6 C) (Oral)   Wt 243 lb (110.2 kg)   SpO2 95%   BMI 41.71 kg/m  Vitals and nursing note reviewed  General: NAD, pleasant Cardiac: RRR, normal heart sounds, no murmurs Respiratory: CTAB, normal effort Abdomen: soft, nontender, nondistended Extremities: no edema or cyanosis. WWP. Skin: warm and dry, no rashes  noted Neuro: alert and oriented, no focal deficits Psych: normal affect  Assessment & Plan:    Vaginal yeast infection Patient reports having symptoms of yeast infection that she is experienced before.  Prescribing thick white discharge with vaginal itching.  Will prescribe Diflucan 1 tablet and then to take a second tablet 3 days later.  Patient to return if she has continued discharge or develops any worsening symptoms such as abdominal pain.  -Given recent symptoms of yeast infection will check hemoglobin A1c to screen for diabetes  -Called patient and discussed A1c of 5.7 which puts patient in range of prediabetes.  Patient encouraged to follow-up with her PCP after she has her total knee replacement and counseled on diet and exercise.  Patient also informed that there is medication metformin that she may choose to begin if diet and exercise do not work for her.  States that she believes she will able to do a lot more when she has her total knee replacement done as it previously helped her become more active.  ESSENTIAL HYPERTENSION, BENIGN BP 128/74 in office.  We will continue current medication of hydrochlorothiazide.    [place] Jaycen Vercher, DO Family Medicine Resident PGY-2

## 2018-12-10 NOTE — Patient Instructions (Addendum)
Thank you for coming to see me today. It was a pleasure! Today we talked about:   We will call you with your lab results.    I have sent Diflucan 2 tablets to your pharmacy.  Please take 1 tablet today and then you may take the other tablet in 3 days.  Please return if you have any abdominal pain, fever, chills, nausea, or vomiting.   Good luck on your surgery!  Please follow-up in 6 months or sooner as needed.  If you have any questions or concerns, please do not hesitate to call the office at [phone].  Take Care,   [place] Sema Stangler, DO

## 2018-12-11 ENCOUNTER — Encounter: Payer: Self-pay | Admitting: Family Medicine

## 2018-12-11 DIAGNOSIS — B3731 Acute candidiasis of vulva and vagina: Secondary | ICD-10-CM | POA: Insufficient documentation

## 2018-12-11 DIAGNOSIS — B373 Candidiasis of vulva and vagina: Secondary | ICD-10-CM | POA: Insufficient documentation

## 2018-12-11 NOTE — Assessment & Plan Note (Addendum)
Patient reports having symptoms of yeast infection that she is experienced before.  Prescribing thick white discharge with vaginal itching.  Will prescribe Diflucan 1 tablet and then to take a second tablet 3 days later.  Patient to return if she has continued discharge or develops any worsening symptoms such as abdominal pain.  -Given recent symptoms of yeast infection will check hemoglobin A1c to screen for diabetes  -Called patient and discussed A1c of 5.7 which puts patient in range of prediabetes.  Patient encouraged to follow-up with her PCP after she has her total knee replacement and counseled on diet and exercise.  Patient also informed that there is medication metformin that she may choose to begin if diet and exercise do not work for her.  States that she believes she will able to do a lot more when she has her total knee replacement done as it previously helped her become more active.

## 2018-12-11 NOTE — Progress Notes (Signed)
Of note, patient refused yearly labs such as BMP due to her having them scheduled with her surgery. With her on HCTZ chronically, discussed importance of monitoring her kidney function and potassium regularly and risks discussed with patient.   Michele Apollo Timothy, DO PGY-2, [facility]

## 2018-12-11 NOTE — Assessment & Plan Note (Signed)
BP 128/74 in office.  We will continue current medication of hydrochlorothiazide.

## 2018-12-14 ENCOUNTER — Encounter (HOSPITAL_COMMUNITY): Payer: Self-pay

## 2018-12-14 NOTE — Pre-Procedure Instructions (Signed)
Charmelle Soh Herbst  12/14/2018      [id number] - [place], [place] - [address] AT [address] [place] [address] Phone: [phone] Fax: [phone]    Your procedure is scheduled on February 11  Report to [facility] at 1045 A.M.  Call this number if you have problems the morning of surgery:  [phone]   Remember:  Do not eat or drink after midnight.    Take these medicines the morning of surgery with A SIP OF WATER  fluconazole (DIFLUCAN if still taking  7 days prior to surgery STOP taking any Aspirin (unless otherwise instructed by your surgeon), Aleve, Naproxen, Ibuprofen, Motrin, Advil, Goody's, BC's, all herbal medications, fish oil, and all vitamins.  Follow your surgeon's instructions on when to stop XARELTO.  If no instructions were given by your surgeon then you will need to call the office to get those instructions.       Do not wear jewelry, make-up or nail polish.  Do not wear lotions, powders, or perfumes, or deodorant.  Do not shave 48 hours prior to surgery.   Do not bring valuables to the hospital.  [facility] is not responsible for any belongings or valuables.  Contacts, dentures or bridgework may not be worn into surgery.  Leave your suitcase in the car.  After surgery it may be brought to your room.  For patients admitted to the hospital, discharge time will be determined by your treatment team.  Patients discharged the day of surgery will not be allowed to drive home.    Special instructions:   [facility]- Preparing For Surgery  Before surgery, you can play an important role. Because skin is not sterile, your skin needs to be as free of germs as possible. You can reduce the number of germs on your skin by washing with CHG (chlorahexidine gluconate) Soap before surgery.  CHG is an antiseptic cleaner which kills germs and bonds with the skin to continue killing germs  even after washing.    Oral Hygiene is also important to reduce your risk of infection.  Remember - BRUSH YOUR TEETH THE MORNING OF SURGERY WITH YOUR REGULAR TOOTHPASTE  Please do not use if you have an allergy to CHG or antibacterial soaps. If your skin becomes reddened/irritated stop using the CHG.  Do not shave (including legs and underarms) for at least 48 hours prior to first CHG shower. It is OK to shave your face.  Please follow these instructions carefully.   1. Shower the NIGHT BEFORE SURGERY and the MORNING OF SURGERY with CHG.   2. If you chose to wash your hair, wash your hair first as usual with your normal shampoo.  3. After you shampoo, rinse your hair and body thoroughly to remove the shampoo.  4. Use CHG as you would any other liquid soap. You can apply CHG directly to the skin and wash gently with a scrungie or a clean washcloth.   5. Apply the CHG Soap to your body ONLY FROM THE NECK DOWN.  Do not use on open wounds or open sores. Avoid contact with your eyes, ears, mouth and genitals (private parts). Wash Face and genitals (private parts)  with your normal soap.  6. Wash thoroughly, paying special attention to the area where your surgery will be performed.  7. Thoroughly rinse your body with warm water from the neck down.  8. DO NOT shower/wash with your  normal soap after using and rinsing off the CHG Soap.  9. Pat yourself dry with a CLEAN TOWEL.  10. Wear CLEAN PAJAMAS to bed the night before surgery, wear comfortable clothes the morning of surgery  11. Place CLEAN SHEETS on your bed the night of your first shower and DO NOT SLEEP WITH PETS.    Day of Surgery:  Do not apply any deodorants/lotions.  Please wear clean clothes to the hospital/surgery center.   Remember to brush your teeth WITH YOUR REGULAR TOOTHPASTE.    Please read over the following fact sheets that you were given.

## 2018-12-15 DIAGNOSIS — T8859XA Other complications of anesthesia, initial encounter: Secondary | ICD-10-CM

## 2018-12-15 HISTORY — DX: Other complications of anesthesia, initial encounter: T88.59XA

## 2018-12-17 ENCOUNTER — Telehealth: Payer: Self-pay

## 2018-12-17 ENCOUNTER — Encounter (HOSPITAL_COMMUNITY)
Admission: RE | Admit: 2018-12-17 | Discharge: 2018-12-17 | Disposition: A | Discharge status: Home / Self Care | Payer: 59 | Source: Ambulatory Visit | Attending: Orthopaedic Surgery | Admitting: Orthopaedic Surgery

## 2018-12-17 ENCOUNTER — Other Ambulatory Visit: Payer: Self-pay

## 2018-12-17 ENCOUNTER — Ambulatory Visit (HOSPITAL_COMMUNITY)
Admission: RE | Admit: 2018-12-17 | Discharge: 2018-12-17 | Disposition: A | Discharge status: Home / Self Care | Payer: 59 | Source: Ambulatory Visit | Attending: Orthopaedic Surgery | Admitting: Orthopaedic Surgery

## 2018-12-17 ENCOUNTER — Encounter (HOSPITAL_COMMUNITY): Payer: Self-pay

## 2018-12-17 DIAGNOSIS — Z96651 Presence of right artificial knee joint: Secondary | ICD-10-CM | POA: Diagnosis not present

## 2018-12-17 DIAGNOSIS — Z01818 Encounter for other preprocedural examination: Secondary | ICD-10-CM | POA: Insufficient documentation

## 2018-12-17 HISTORY — DX: Prediabetes: R73.03

## 2018-12-17 HISTORY — DX: Personal history of urinary calculi: Z87.442

## 2018-12-17 LAB — SURGICAL PCR SCREEN
MRSA, PCR: NEGATIVE
Staphylococcus aureus: POSITIVE — AB

## 2018-12-17 LAB — CBC WITH DIFFERENTIAL/PLATELET
Abs Immature Granulocytes: 0.03 10*3/uL (ref 0.00–0.07)
BASOS ABS: 0.1 10*3/uL (ref 0.0–0.1)
Basophils Relative: 1 %
Eosinophils Absolute: 0.2 10*3/uL (ref 0.0–0.5)
Eosinophils Relative: 3 %
HCT: 47.5 % — ABNORMAL HIGH (ref 36.0–46.0)
Hemoglobin: 14.7 g/dL (ref 12.0–15.0)
Immature Granulocytes: 0 %
Lymphocytes Relative: 31 %
Lymphs Abs: 2.1 10*3/uL (ref 0.7–4.0)
MCH: 29.2 pg (ref 26.0–34.0)
MCHC: 30.9 g/dL (ref 30.0–36.0)
MCV: 94.4 fL (ref 80.0–100.0)
Monocytes Absolute: 0.7 10*3/uL (ref 0.1–1.0)
Monocytes Relative: 11 %
NEUTROS ABS: 3.7 10*3/uL (ref 1.7–7.7)
NEUTROS PCT: 54 %
NRBC: 0 % (ref 0.0–0.2)
PLATELETS: 211 10*3/uL (ref 150–400)
RBC: 5.03 MIL/uL (ref 3.87–5.11)
RDW: 12.8 % (ref 11.5–15.5)
WBC: 6.8 10*3/uL (ref 4.0–10.5)

## 2018-12-17 LAB — TYPE AND SCREEN
ABO/RH(D): A POS
Antibody Screen: NEGATIVE

## 2018-12-17 LAB — URINALYSIS, ROUTINE W REFLEX MICROSCOPIC
Bilirubin Urine: NEGATIVE
Glucose, UA: NEGATIVE mg/dL
Hgb urine dipstick: NEGATIVE
Ketones, ur: NEGATIVE mg/dL
Leukocytes, UA: NEGATIVE
Nitrite: NEGATIVE
Protein, ur: NEGATIVE mg/dL
Specific Gravity, Urine: 1.017 (ref 1.005–1.030)
pH: 6 (ref 5.0–8.0)

## 2018-12-17 LAB — BASIC METABOLIC PANEL
ANION GAP: 9 (ref 5–15)
BUN: 15 mg/dL (ref 8–23)
CO2: 27 mmol/L (ref 22–32)
Calcium: 10.4 mg/dL — ABNORMAL HIGH (ref 8.9–10.3)
Chloride: 103 mmol/L (ref 98–111)
Creatinine, Ser: 0.76 mg/dL (ref 0.44–1.00)
GFR calc non Af Amer: >60 mL/min (ref >60)
Glucose, Bld: 110 mg/dL — ABNORMAL HIGH (ref 70–99)
POTASSIUM: 3.6 mmol/L (ref 3.5–5.1)
Sodium: 139 mmol/L (ref 135–145)

## 2018-12-17 NOTE — Telephone Encounter (Signed)
Patient left message that Diflucan prescribed on 1/27 did not resolve her yeast infection. Request another med or a stronger med. Would like to speak to physician about it.   Please call after 12 today: [phone]  Danley Danker, RN [facility] RN)

## 2018-12-17 NOTE — Telephone Encounter (Signed)
Patient will need to come back to be evaluated, as she got the stronger dosage and did not want a vaginal exam. Patient will need an exam.   Thanks.

## 2018-12-17 NOTE — Progress Notes (Signed)
Pt positive for staph. [facility]irocin called in to [facility] [place] per pt preference.   Jacqlyn Larsen, RN

## 2018-12-17 NOTE — Progress Notes (Signed)
PCP -Lucila Maine, DO/ Shirley [place], DO Cardiologist - denies  Chest x-ray - 12/17/18 EKG - 12/17/18 Stress Test -denies  ECHO - denies Cardiac Cath - denies  Sleep Study - denies  Denies being diabetic; last A1C 12/10/18 was 5.7.  Blood Thinner Instructions: Xarelto, pt denies getting instructions. Left message with Horatio Pel at Dr. Jerald Kief office with pt's callback number for instructions on Xarelto.  Pt will need PT/INR and APTT drawn on DOS.   Anesthesia review: No  Patient denies shortness of breath, fever, cough and chest pain at PAT appointment   Patient verbalized understanding of instructions that were given to them at the PAT appointment. Patient was also instructed that they will need to review over the PAT instructions again at home before surgery.

## 2018-12-18 NOTE — Telephone Encounter (Signed)
Patient aware. Appt made. Danley Danker, RN [facility] RN)

## 2018-12-19 ENCOUNTER — Ambulatory Visit: Payer: 59 | Admitting: Family Medicine

## 2018-12-19 ENCOUNTER — Other Ambulatory Visit: Payer: Self-pay

## 2018-12-19 VITALS — BP 162/84 | HR 82 | Temp 98.2°F | Ht 66.0 in | Wt 241.4 lb

## 2018-12-19 DIAGNOSIS — B373 Candidiasis of vulva and vagina: Secondary | ICD-10-CM | POA: Diagnosis not present

## 2018-12-19 DIAGNOSIS — B3731 Acute candidiasis of vulva and vagina: Secondary | ICD-10-CM

## 2018-12-19 LAB — POCT WET PREP (WET MOUNT)
CLUE CELLS WET PREP WHIFF POC: NEGATIVE
Trichomonas Wet Prep HPF POC: ABSENT

## 2018-12-19 MED ORDER — FLUCONAZOLE 200 MG PO TABS: ORAL_TABLET | ORAL | 0 refills | Status: DC

## 2018-12-19 NOTE — Progress Notes (Addendum)
   Subjective:   Patient ID: Michele Morrison    DOB: 22-Jan-1956, 63 y.o. female   MRN: [id number]  CC: possible yeast infection   HPI: Michele Morrison is a 63 y.o. female who presents to clinic today for the following issue.  Concern for yeast infection Seen by Dr. Enid Derry on 1/27 with symptoms of yeast infection - thick white discharge with vaginal itching.  She declined a pelvic exam at the time and was prescribed 2 tabs of Diflucan which she has taken Monday and Thursday but feels has not fully resolved her symptoms.  She presents today for testing.  She is not sexually active.   ROS: No fever, chills, nausea, vomiting.  No dysuria, frequency or urgency.  Social: pt is a never smoker.  Medications reviewed. Objective:   BP (!) 162/84   Pulse 82   Temp 98.2 F (36.8 C) (Oral)   Ht 5\' 6"  (1.676 m)   Wt 241 lb 6.4 oz (109.5 kg)   SpO2 95%   BMI 38.96 kg/m  Vitals and nursing note reviewed.  General: 63 year old female, NAD  CV: RRR no MRG  Lungs: CTAB, normal effort  Abdomen: soft, NTND, +bs  Skin: warm, dry, no rash Pelvic exam: VULVA: normal appearing vulva with no masses, tenderness or lesions, VAGINA: normal appearing vagina with normal color and discharge, no lesions. Extremities: warm and well perfused, normal tone   Assessment & Plan:   Vaginal discharge  No discharge visible on exam, wet prep performed today and positive for yeast.  She decided to forego STD testing as she is not sexually active.  -Rx: diflucan 250 mg x2 tabs sent to pharmacy, pt informed of results  Follow up if symptoms worsen or do not improve   Lovenia Kim, MD [place] PGY-3

## 2018-12-19 NOTE — Patient Instructions (Signed)
It is nice meeting you today.  You were seen in clinic for a possible yeast infection and we did some testing with a swab.  I will give you a call once I have the results of this and send in medication to your pharmacy if needed.  Please call clinic if you have any questions.

## 2018-12-20 NOTE — H&P (Signed)
TOTAL KNEE ADMISSION H&P  Patient is being admitted for right total knee arthroplasty.  Subjective:  Chief Complaint:right knee pain.  HPI: Michele Morrison, 63 y.o. female, has a history of pain and functional disability in the right knee due to arthritis and has failed non-surgical conservative treatments for greater than 12 weeks to includeNSAID's and/or analgesics, corticosteriod injections, viscosupplementation injections, flexibility and strengthening excercises, use of assistive devices, weight reduction as appropriate and activity modification.  Onset of symptoms was gradual, starting 5 years ago with gradually worsening course since that time. The patient noted no past surgery on the right knee(s).  Patient currently rates pain in the right knee(s) at 10 out of 10 with activity. Patient has night pain, worsening of pain with activity and weight bearing, pain that interferes with activities of daily living, crepitus and joint swelling.  Patient has evidence of subchondral cysts, subchondral sclerosis, periarticular osteophytes and joint space narrowing by imaging studies. There is no active infection.  Patient Active Problem List   Diagnosis Date Noted  . Vaginal yeast infection 12/11/2018  . Acute bronchitis 04/25/2017  . Preventative health care 12/21/2015  . Ulcer of ankle ([place]) 05/27/2014  . Other malaise and fatigue 01/01/2014  . Long term (current) use of anticoagulants 12/25/2010  . ESSENTIAL HYPERTENSION, BENIGN 05/03/2010  . OBESITY 02/07/2008   Past Medical History:  Diagnosis Date  . Arthritis    thumbs,spine  . Clotting disorder ([place])    [id number]  . Diverticulosis   . H/O blood clots   . History of kidney stones   . HTN (hypertension)   . Hyperlipidemia   . Kidney stones   . Pre-diabetes     Past Surgical History:  Procedure Laterality Date  . COLONOSCOPY    . ROTATOR CUFF REPAIR Right   . TOTAL KNEE ARTHROPLASTY Left 2012    No current facility-administered  medications for this encounter.    Current Outpatient Medications  Medication Sig Dispense Refill Last Dose  . hydrochlorothiazide (HYDRODIURIL) 25 MG tablet TAKE 1 TABLET BY MOUTH EVERY DAY (Patient taking differently: Take 25 mg by mouth every evening. ) 30 tablet 0   . XARELTO 20 MG TABS tablet TAKE 1 TABLET BY MOUTH EVERY DAY WITH SUPPER (Patient taking differently: Take 20 mg by mouth daily with supper. ) 90 tablet 2   . fluconazole (DIFLUCAN) 200 MG tablet Take one tablet today followed by second tablet if no resolution after 72 hours. 2 tablet 0   . pantoprazole (PROTONIX) 40 MG tablet TAKE 1 TABLET BY MOUTH EVERY DAY (Patient not taking: Reported on 08/20/2018) 30 tablet 0 Not Taking   Allergies  Allergen Reactions  . Cephalexin Rash  . Penicillins Other (See Comments)    Did it involve swelling of the face/tongue/throat, SOB, or low BP? Unknown-patient has NEVER taken  Did it involve sudden or severe rash/hives, skin peeling, or any reaction on the inside of your mouth or nose? Unknown-patient has NEVER taken  Did you need to seek medical attention at a hospital or doctor's office? Unknown-patient has NEVER taken  When did it last happen?patient has NEVER taken this medication If all above answers are "NO", may proceed with cephalosporin use.     Social History   Tobacco Use  . Smoking status: Never Smoker  . Smokeless tobacco: Never Used  Substance Use Topics  . Alcohol use: Yes    Alcohol/week: 0.0 standard drinks    Comment: rarely    Family History  Problem  Relation Age of Onset  . Colon cancer Mother 57  . Coronary artery disease Mother   . Heart disease Mother   . Cancer Father        Non-Hodgkin's lymphoma  . Coronary artery disease Father   . Heart disease Father   . Stomach cancer Paternal Grandfather        pt unsure  . Esophageal cancer Neg Hx   . Rectal cancer Neg Hx      Review of Systems  Musculoskeletal: Positive for joint pain.       Right  knee  All other systems reviewed and are negative.   Objective:  Physical Exam  Constitutional: She is oriented to person, place, and time. She appears well-developed and well-nourished.  HENT:  Head: Normocephalic and atraumatic.  Eyes: Pupils are equal, round, and reactive to light.  Neck: Normal range of motion.  Cardiovascular: Normal rate and regular rhythm.  Respiratory: Effort normal.  GI: Soft.  Musculoskeletal:     Comments: Right knee is tender along the medial joint line.  She has crepitation but no effusion.  Range of motion is from 0-110.  Hip motion is full and pain free and SLR is negative on both sides.  There is no palpable LAD behind either knee.  Sensation and motor function are intact on both sides and there are palpable pulses on both sides.    Neurological: She is alert and oriented to person, place, and time.  Skin: Skin is warm and dry.  Psychiatric: She has a normal mood and affect. Her behavior is normal. Judgment and thought content normal.    Vital signs in last 24 hours: Temp:  [98.2 F (36.8 C)] 98.2 F (36.8 C) (02/05 1617) Pulse Rate:  [82] 82 (02/05 1617) BP: (162)/(84) 162/84 (02/05 1617) SpO2:  [95 %] 95 % (02/05 1617) Weight:  [109.5 kg] 109.5 kg (02/05 1617)  Labs:   Estimated body mass index is 38.96 kg/m as calculated from the following:   Height as of 12/19/18: 5\' 6"  (1.676 m).   Weight as of 12/19/18: 109.5 kg.   Imaging Review Plain radiographs demonstrate severe degenerative joint disease of the right knee(s). The overall alignment isneutral. The bone quality appears to be good for age and reported activity level.      Assessment/Plan:  End stage primary arthritis, right knee   The patient history, physical examination, clinical judgment of the provider and imaging studies are consistent with end stage degenerative joint disease of the right knee(s) and total knee arthroplasty is deemed medically necessary. The treatment  options including medical management, injection therapy arthroscopy and arthroplasty were discussed at length. The risks and benefits of total knee arthroplasty were presented and reviewed. The risks due to aseptic loosening, infection, stiffness, patella tracking problems, thromboembolic complications and other imponderables were discussed. The patient acknowledged the explanation, agreed to proceed with the plan and consent was signed. Patient is being admitted for inpatient treatment for surgery, pain control, PT, OT, prophylactic antibiotics, VTE prophylaxis, progressive ambulation and ADL's and discharge planning. The patient is planning to be discharged home with home health services   Patient's anticipated LOS is less than 2 midnights, meeting these requirements: - Younger than 68 - Lives within 1 hour of care - Has a competent adult at home to recover with post-op recover - NO history of  - Chronic pain requiring opiods  - Diabetes  - Coronary Artery Disease  - Heart failure  - Heart attack  -  Stroke  - DVT/VTE  - Cardiac arrhythmia  - Respiratory Failure/COPD  - Renal failure  - Anemia  - Advanced Liver disease

## 2018-12-21 ENCOUNTER — Telehealth: Payer: Self-pay | Admitting: Family Medicine

## 2018-12-21 NOTE — Telephone Encounter (Signed)
Juliann Pulse from [facility] called and she said that she faxed a document on 12/17/2018 regarding for the patient to stop taking zerolto before her surgical procedure on Tuesday 12/25/2018. Please call kathy back at [phone]. ad

## 2018-12-21 NOTE — Telephone Encounter (Signed)
Will forward to MD to advise. Jazmin Hartsell,CMA  

## 2018-12-24 MED ORDER — TRANEXAMIC ACID-NACL 1000-0.7 MG/100ML-% IV SOLN
1000.0000 mg | INTRAVENOUS | Status: AC
  Administered 2018-12-25: 1000 mg via INTRAVENOUS
  Filled 2018-12-24: qty 100

## 2018-12-24 MED ORDER — TRANEXAMIC ACID 1000 MG/10ML IV SOLN
2000.0000 mg | INTRAVENOUS | Status: AC
  Administered 2018-12-25: 2000 mg via TOPICAL
  Filled 2018-12-24: qty 20

## 2018-12-24 MED ORDER — BUPIVACAINE LIPOSOME 1.3 % IJ SUSP
20.0000 mL | INTRAMUSCULAR | Status: AC
  Administered 2018-12-25: 20 mL
  Filled 2018-12-24: qty 20

## 2018-12-24 MED ORDER — VANCOMYCIN HCL 10 G IV SOLR
1500.0000 mg | INTRAVENOUS | Status: AC
  Administered 2018-12-25: 1500 mg via INTRAVENOUS
  Filled 2018-12-24: qty 1500

## 2018-12-24 NOTE — Telephone Encounter (Signed)
This was faxed back to them last week

## 2018-12-25 ENCOUNTER — Ambulatory Visit (HOSPITAL_COMMUNITY): Payer: 59 | Admitting: Certified Registered"

## 2018-12-25 ENCOUNTER — Encounter (HOSPITAL_COMMUNITY): Payer: Self-pay

## 2018-12-25 ENCOUNTER — Other Ambulatory Visit: Payer: Self-pay

## 2018-12-25 ENCOUNTER — Observation Stay (HOSPITAL_COMMUNITY)
Admission: RE | Admit: 2018-12-25 | Discharge: 2018-12-27 | Disposition: A | Discharge status: Home / Self Care | Payer: 59 | Source: Ambulatory Visit | Attending: Orthopaedic Surgery | Admitting: Orthopaedic Surgery

## 2018-12-25 ENCOUNTER — Encounter (HOSPITAL_COMMUNITY)
Admission: RE | Disposition: A | Discharge status: Home / Self Care | Payer: Self-pay | Source: Ambulatory Visit | Attending: Orthopaedic Surgery

## 2018-12-25 DIAGNOSIS — Z880 Allergy status to penicillin: Secondary | ICD-10-CM | POA: Diagnosis not present

## 2018-12-25 DIAGNOSIS — Z881 Allergy status to other antibiotic agents status: Secondary | ICD-10-CM | POA: Insufficient documentation

## 2018-12-25 DIAGNOSIS — G8918 Other acute postprocedural pain: Secondary | ICD-10-CM | POA: Diagnosis not present

## 2018-12-25 DIAGNOSIS — I10 Essential (primary) hypertension: Secondary | ICD-10-CM | POA: Diagnosis not present

## 2018-12-25 DIAGNOSIS — Z79899 Other long term (current) drug therapy: Secondary | ICD-10-CM | POA: Insufficient documentation

## 2018-12-25 DIAGNOSIS — Z86718 Personal history of other venous thrombosis and embolism: Secondary | ICD-10-CM | POA: Insufficient documentation

## 2018-12-25 DIAGNOSIS — R2689 Other abnormalities of gait and mobility: Secondary | ICD-10-CM | POA: Insufficient documentation

## 2018-12-25 DIAGNOSIS — Z7901 Long term (current) use of anticoagulants: Secondary | ICD-10-CM | POA: Diagnosis not present

## 2018-12-25 DIAGNOSIS — E785 Hyperlipidemia, unspecified: Secondary | ICD-10-CM | POA: Diagnosis not present

## 2018-12-25 DIAGNOSIS — R2681 Unsteadiness on feet: Secondary | ICD-10-CM | POA: Insufficient documentation

## 2018-12-25 DIAGNOSIS — M1711 Unilateral primary osteoarthritis, right knee: Principal | ICD-10-CM | POA: Diagnosis present

## 2018-12-25 DIAGNOSIS — K219 Gastro-esophageal reflux disease without esophagitis: Secondary | ICD-10-CM | POA: Diagnosis not present

## 2018-12-25 DIAGNOSIS — Z96652 Presence of left artificial knee joint: Secondary | ICD-10-CM | POA: Insufficient documentation

## 2018-12-25 DIAGNOSIS — Z6838 Body mass index (BMI) 38.0-38.9, adult: Secondary | ICD-10-CM | POA: Diagnosis not present

## 2018-12-25 DIAGNOSIS — Z8249 Family history of ischemic heart disease and other diseases of the circulatory system: Secondary | ICD-10-CM | POA: Diagnosis not present

## 2018-12-25 DIAGNOSIS — E669 Obesity, unspecified: Secondary | ICD-10-CM | POA: Insufficient documentation

## 2018-12-25 HISTORY — PX: TOTAL KNEE ARTHROPLASTY: SHX125

## 2018-12-25 HISTORY — DX: Unilateral primary osteoarthritis, right knee: M17.11

## 2018-12-25 LAB — PROTIME-INR
INR: 1.04
Prothrombin Time: 13.5 seconds (ref 11.4–15.2)

## 2018-12-25 LAB — APTT: aPTT: 34 seconds (ref 24–36)

## 2018-12-25 SURGERY — ARTHROPLASTY, KNEE, TOTAL
Anesthesia: Spinal | Laterality: Right

## 2018-12-25 MED ORDER — KETOROLAC TROMETHAMINE 15 MG/ML IJ SOLN
15.0000 mg | Freq: Four times a day (QID) | INTRAMUSCULAR | Status: AC
  Administered 2018-12-25 – 2018-12-26 (×4): 15 mg via INTRAVENOUS
  Filled 2018-12-25 (×3): qty 1

## 2018-12-25 MED ORDER — GLYCOPYRROLATE 0.2 MG/ML IJ SOLN
INTRAMUSCULAR | Status: DC | PRN
  Administered 2018-12-25: 0.1 mg via INTRAVENOUS

## 2018-12-25 MED ORDER — METOCLOPRAMIDE HCL 5 MG PO TABS: 5.0000 mg | ORAL_TABLET | Freq: Three times a day (TID) | ORAL | Status: DC | PRN

## 2018-12-25 MED ORDER — SODIUM CHLORIDE 0.9 % IV SOLN
INTRAVENOUS | Status: DC | PRN
  Administered 2018-12-25: 30 ug/min via INTRAVENOUS

## 2018-12-25 MED ORDER — HYDROCODONE-ACETAMINOPHEN 7.5-325 MG PO TABS
1.0000 | ORAL_TABLET | ORAL | Status: DC | PRN
  Administered 2018-12-25: 2 via ORAL

## 2018-12-25 MED ORDER — HYDROCODONE-ACETAMINOPHEN 7.5-325 MG PO TABS
ORAL_TABLET | ORAL | Status: AC
  Filled 2018-12-25: qty 2

## 2018-12-25 MED ORDER — LACTATED RINGERS IV SOLN
INTRAVENOUS | Status: DC
  Administered 2018-12-25: 18:00:00 via INTRAVENOUS

## 2018-12-25 MED ORDER — DIPHENHYDRAMINE HCL 50 MG/ML IJ SOLN
25.0000 mg | Freq: Once | INTRAMUSCULAR | Status: AC
  Administered 2018-12-25: 25 mg via INTRAVENOUS

## 2018-12-25 MED ORDER — BUPIVACAINE HCL (PF) 0.5 % IJ SOLN
INTRAMUSCULAR | Status: AC
  Filled 2018-12-25: qty 30

## 2018-12-25 MED ORDER — PROPOFOL 10 MG/ML IV BOLUS
INTRAVENOUS | Status: DC | PRN
  Administered 2018-12-25: 20 mg via INTRAVENOUS
  Administered 2018-12-25: 60 mg via INTRAVENOUS

## 2018-12-25 MED ORDER — METHOCARBAMOL 500 MG PO TABS
ORAL_TABLET | ORAL | Status: AC
  Filled 2018-12-25: qty 1

## 2018-12-25 MED ORDER — GLYCOPYRROLATE PF 0.2 MG/ML IJ SOSY
PREFILLED_SYRINGE | INTRAMUSCULAR | Status: AC
  Filled 2018-12-25: qty 1

## 2018-12-25 MED ORDER — MEPERIDINE HCL 50 MG/ML IJ SOLN: 6.2500 mg | INTRAMUSCULAR | Status: DC | PRN

## 2018-12-25 MED ORDER — ACETAMINOPHEN 325 MG PO TABS: 325.0000 mg | ORAL_TABLET | Freq: Four times a day (QID) | ORAL | Status: DC | PRN

## 2018-12-25 MED ORDER — MORPHINE SULFATE (PF) 2 MG/ML IV SOLN: 0.5000 mg | INTRAVENOUS | Status: DC | PRN

## 2018-12-25 MED ORDER — SODIUM CHLORIDE 0.9 % IR SOLN
Status: DC | PRN
  Administered 2018-12-25: 3000 mL

## 2018-12-25 MED ORDER — 0.9 % SODIUM CHLORIDE (POUR BTL) OPTIME
TOPICAL | Status: DC | PRN
  Administered 2018-12-25: 1000 mL

## 2018-12-25 MED ORDER — FENTANYL CITRATE (PF) 100 MCG/2ML IJ SOLN
INTRAMUSCULAR | Status: AC
  Filled 2018-12-25: qty 2

## 2018-12-25 MED ORDER — DIPHENHYDRAMINE HCL 50 MG/ML IJ SOLN
INTRAMUSCULAR | Status: AC
  Administered 2018-12-25: 25 mg via INTRAVENOUS
  Filled 2018-12-25: qty 1

## 2018-12-25 MED ORDER — ALUM & MAG HYDROXIDE-SIMETH 200-200-20 MG/5ML PO SUSP: 30.0000 mL | ORAL | Status: DC | PRN

## 2018-12-25 MED ORDER — BUPIVACAINE-EPINEPHRINE (PF) 0.5% -1:200000 IJ SOLN
INTRAMUSCULAR | Status: DC | PRN
  Administered 2018-12-25: 30 mL via PERINEURAL

## 2018-12-25 MED ORDER — METOCLOPRAMIDE HCL 5 MG/ML IJ SOLN: 5.0000 mg | Freq: Three times a day (TID) | INTRAMUSCULAR | Status: DC | PRN

## 2018-12-25 MED ORDER — LIDOCAINE 2% (20 MG/ML) 5 ML SYRINGE
INTRAMUSCULAR | Status: AC
  Filled 2018-12-25: qty 5

## 2018-12-25 MED ORDER — DOCUSATE SODIUM 100 MG PO CAPS
100.0000 mg | ORAL_CAPSULE | Freq: Two times a day (BID) | ORAL | Status: DC
  Administered 2018-12-25 – 2018-12-26 (×3): 100 mg via ORAL
  Filled 2018-12-25 (×3): qty 1

## 2018-12-25 MED ORDER — MIDAZOLAM HCL 2 MG/2ML IJ SOLN
INTRAMUSCULAR | Status: AC
  Administered 2018-12-25: 2 mg via INTRAVENOUS
  Filled 2018-12-25: qty 2

## 2018-12-25 MED ORDER — FENTANYL CITRATE (PF) 100 MCG/2ML IJ SOLN
50.0000 ug | Freq: Once | INTRAMUSCULAR | Status: AC
  Administered 2018-12-25: 50 ug via INTRAVENOUS

## 2018-12-25 MED ORDER — ONDANSETRON HCL 4 MG/2ML IJ SOLN: 4.0000 mg | Freq: Once | INTRAMUSCULAR | Status: DC | PRN

## 2018-12-25 MED ORDER — EPINEPHRINE PF 1 MG/ML IJ SOLN
INTRAMUSCULAR | Status: DC | PRN
  Administered 2018-12-25: .15 mL

## 2018-12-25 MED ORDER — TRANEXAMIC ACID-NACL 1000-0.7 MG/100ML-% IV SOLN
1000.0000 mg | Freq: Once | INTRAVENOUS | Status: AC
  Administered 2018-12-25: 1000 mg via INTRAVENOUS
  Filled 2018-12-25: qty 100

## 2018-12-25 MED ORDER — METHOCARBAMOL 1000 MG/10ML IJ SOLN
500.0000 mg | Freq: Four times a day (QID) | INTRAVENOUS | Status: DC | PRN
  Filled 2018-12-25: qty 5

## 2018-12-25 MED ORDER — PHENOL 1.4 % MT LIQD: 1.0000 | OROMUCOSAL | Status: DC | PRN

## 2018-12-25 MED ORDER — BISACODYL 5 MG PO TBEC: 5.0000 mg | DELAYED_RELEASE_TABLET | Freq: Every day | ORAL | Status: DC | PRN

## 2018-12-25 MED ORDER — HYDROCODONE-ACETAMINOPHEN 5-325 MG PO TABS
1.0000 | ORAL_TABLET | ORAL | Status: DC | PRN
  Administered 2018-12-25 – 2018-12-26 (×2): 2 via ORAL
  Administered 2018-12-26: 1 via ORAL
  Administered 2018-12-27 (×2): 2 via ORAL
  Filled 2018-12-25 (×6): qty 2

## 2018-12-25 MED ORDER — FENTANYL CITRATE (PF) 100 MCG/2ML IJ SOLN
INTRAMUSCULAR | Status: AC
  Administered 2018-12-25: 50 ug via INTRAVENOUS
  Filled 2018-12-25: qty 2

## 2018-12-25 MED ORDER — DIPHENHYDRAMINE HCL 12.5 MG/5ML PO ELIX: 12.5000 mg | ORAL_SOLUTION | ORAL | Status: DC | PRN

## 2018-12-25 MED ORDER — HYDROCHLOROTHIAZIDE 25 MG PO TABS
25.0000 mg | ORAL_TABLET | Freq: Every evening | ORAL | Status: DC
  Administered 2018-12-25 – 2018-12-26 (×2): 25 mg via ORAL
  Filled 2018-12-25 (×2): qty 1

## 2018-12-25 MED ORDER — EPINEPHRINE PF 1 MG/ML IJ SOLN
INTRAMUSCULAR | Status: AC
  Filled 2018-12-25: qty 1

## 2018-12-25 MED ORDER — LACTATED RINGERS IV SOLN
INTRAVENOUS | Status: DC
  Administered 2018-12-25: 11:00:00 via INTRAVENOUS

## 2018-12-25 MED ORDER — KETOROLAC TROMETHAMINE 30 MG/ML IJ SOLN
INTRAMUSCULAR | Status: AC
  Filled 2018-12-25: qty 1

## 2018-12-25 MED ORDER — CHLORHEXIDINE GLUCONATE 4 % EX LIQD: 60.0000 mL | Freq: Once | CUTANEOUS | Status: DC

## 2018-12-25 MED ORDER — SODIUM CHLORIDE 0.9% FLUSH
INTRAVENOUS | Status: DC | PRN
  Administered 2018-12-25: 10 mL

## 2018-12-25 MED ORDER — MIDAZOLAM HCL 2 MG/2ML IJ SOLN
2.0000 mg | Freq: Once | INTRAMUSCULAR | Status: AC
  Administered 2018-12-25: 2 mg via INTRAVENOUS

## 2018-12-25 MED ORDER — METHOCARBAMOL 500 MG PO TABS
500.0000 mg | ORAL_TABLET | Freq: Four times a day (QID) | ORAL | Status: DC | PRN
  Administered 2018-12-25 – 2018-12-27 (×4): 500 mg via ORAL
  Filled 2018-12-25 (×3): qty 1

## 2018-12-25 MED ORDER — BUPIVACAINE HCL (PF) 0.5 % IJ SOLN
INTRAMUSCULAR | Status: DC | PRN
  Administered 2018-12-25: 30 mL

## 2018-12-25 MED ORDER — BUPIVACAINE IN DEXTROSE 0.75-8.25 % IT SOLN
INTRATHECAL | Status: DC | PRN
  Administered 2018-12-25: 1.7 mL via INTRATHECAL

## 2018-12-25 MED ORDER — MENTHOL 3 MG MT LOZG: 1.0000 | LOZENGE | OROMUCOSAL | Status: DC | PRN

## 2018-12-25 MED ORDER — ARTIFICIAL TEARS OPHTHALMIC OINT
TOPICAL_OINTMENT | OPHTHALMIC | Status: AC
  Filled 2018-12-25: qty 3.5

## 2018-12-25 MED ORDER — ONDANSETRON HCL 4 MG PO TABS: 4.0000 mg | ORAL_TABLET | Freq: Four times a day (QID) | ORAL | Status: DC | PRN

## 2018-12-25 MED ORDER — ONDANSETRON HCL 4 MG/2ML IJ SOLN
4.0000 mg | Freq: Four times a day (QID) | INTRAMUSCULAR | Status: DC | PRN
  Administered 2018-12-26: 4 mg via INTRAVENOUS
  Filled 2018-12-25: qty 2

## 2018-12-25 MED ORDER — PROPOFOL 500 MG/50ML IV EMUL
INTRAVENOUS | Status: DC | PRN
  Administered 2018-12-25: 80 ug/kg/min via INTRAVENOUS

## 2018-12-25 MED ORDER — KETOROLAC TROMETHAMINE 15 MG/ML IJ SOLN
INTRAMUSCULAR | Status: AC
  Filled 2018-12-25: qty 1

## 2018-12-25 MED ORDER — RIVAROXABAN 20 MG PO TABS
20.0000 mg | ORAL_TABLET | Freq: Every day | ORAL | Status: DC
  Administered 2018-12-26: 20 mg via ORAL
  Filled 2018-12-25: qty 1

## 2018-12-25 MED ORDER — ACETAMINOPHEN 500 MG PO TABS
500.0000 mg | ORAL_TABLET | Freq: Four times a day (QID) | ORAL | Status: AC
  Administered 2018-12-25 – 2018-12-26 (×3): 500 mg via ORAL
  Filled 2018-12-25 (×3): qty 1

## 2018-12-25 MED ORDER — FENTANYL CITRATE (PF) 100 MCG/2ML IJ SOLN
25.0000 ug | INTRAMUSCULAR | Status: DC | PRN
  Administered 2018-12-25 (×3): 50 ug via INTRAVENOUS

## 2018-12-25 MED ORDER — LIDOCAINE 2% (20 MG/ML) 5 ML SYRINGE
INTRAMUSCULAR | Status: DC | PRN
  Administered 2018-12-25: 30 mg via INTRAVENOUS

## 2018-12-25 SURGICAL SUPPLY — 56 items
BAG DECANTER FOR FLEXI CONT (MISCELLANEOUS) IMPLANT
BANDAGE ESMARK 6X9 LF (GAUZE/BANDAGES/DRESSINGS) ×1 IMPLANT
BLADE SAGITTAL 25.0X1.19X90 (BLADE) ×2 IMPLANT
BLADE SAW SGTL 13.0X1.19X90.0M (BLADE) IMPLANT
BNDG CMPR 9X6 STRL LF SNTH (GAUZE/BANDAGES/DRESSINGS) ×1
BNDG CMPR MED 10X6 ELC LF (GAUZE/BANDAGES/DRESSINGS) ×1
BNDG ELASTIC 6X10 VLCR STRL LF (GAUZE/BANDAGES/DRESSINGS) ×2 IMPLANT
BNDG ESMARK 6X9 LF (GAUZE/BANDAGES/DRESSINGS) ×2
BOWL SMART MIX CTS (DISPOSABLE) ×2 IMPLANT
CEMENT HV SMART SET (Cement) ×4 IMPLANT
COMP FEM CEM STD+ RT LCS (Orthopedic Implant) ×2 IMPLANT
COMPONENT FEM CEM STD+ RT LCS (Orthopedic Implant) IMPLANT
COVER SURGICAL LIGHT HANDLE (MISCELLANEOUS) ×2 IMPLANT
COVER WAND RF STERILE (DRAPES) ×2 IMPLANT
CUFF TOURNIQUET SINGLE 34IN LL (TOURNIQUET CUFF) ×2 IMPLANT
CUFF TOURNIQUET SINGLE 44IN (TOURNIQUET CUFF) IMPLANT
DECANTER SPIKE VIAL GLASS SM (MISCELLANEOUS) ×2 IMPLANT
DRAPE EXTREMITY T 121X128X90 (DISPOSABLE) ×2 IMPLANT
DRAPE HALF SHEET 40X57 (DRAPES) ×4 IMPLANT
DRAPE U-SHAPE 47X51 STRL (DRAPES) ×2 IMPLANT
DRSG AQUACEL AG ADV 3.5X10 (GAUZE/BANDAGES/DRESSINGS) ×2 IMPLANT
DURAPREP 26ML APPLICATOR (WOUND CARE) ×2 IMPLANT
ELECT REM PT RETURN 9FT ADLT (ELECTROSURGICAL) ×2
ELECTRODE REM PT RTRN 9FT ADLT (ELECTROSURGICAL) ×1 IMPLANT
GLOVE BIO SURGEON STRL SZ8 (GLOVE) ×4 IMPLANT
GLOVE BIOGEL PI IND STRL 8 (GLOVE) ×2 IMPLANT
GLOVE BIOGEL PI INDICATOR 8 (GLOVE) ×2
GOWN STRL REUS W/ TWL LRG LVL3 (GOWN DISPOSABLE) ×1 IMPLANT
GOWN STRL REUS W/ TWL XL LVL3 (GOWN DISPOSABLE) ×2 IMPLANT
GOWN STRL REUS W/TWL LRG LVL3 (GOWN DISPOSABLE) ×2
GOWN STRL REUS W/TWL XL LVL3 (GOWN DISPOSABLE) ×4
HANDPIECE INTERPULSE COAX TIP (DISPOSABLE) ×2
HOOD PEEL AWAY FACE SHEILD DIS (HOOD) ×4 IMPLANT
INSERT TIB LCS RP STD+ 10 (Knees) ×1 IMPLANT
KIT BASIN OR (CUSTOM PROCEDURE TRAY) ×2 IMPLANT
KIT TURNOVER KIT B (KITS) ×2 IMPLANT
MANIFOLD NEPTUNE II (INSTRUMENTS) ×2 IMPLANT
NDL HYPO 21X1 ECLIPSE (NEEDLE) ×1 IMPLANT
NEEDLE HYPO 21X1 ECLIPSE (NEEDLE) ×2 IMPLANT
NS IRRIG 1000ML POUR BTL (IV SOLUTION) ×2 IMPLANT
PACK TOTAL JOINT (CUSTOM PROCEDURE TRAY) ×2 IMPLANT
PAD ARMBOARD 7.5X6 YLW CONV (MISCELLANEOUS) ×4 IMPLANT
PATELLA DOME PFC 35MM (Knees) ×2 IMPLANT
PIN STEINMAN FIXATION KNEE (PIN) ×1 IMPLANT
SET HNDPC FAN SPRY TIP SCT (DISPOSABLE) ×1 IMPLANT
SUT VIC AB 0 CT1 27 (SUTURE) ×2
SUT VIC AB 0 CT1 27XBRD ANBCTR (SUTURE) ×1 IMPLANT
SUT VIC AB 2-0 CT1 27 (SUTURE) ×2
SUT VIC AB 2-0 CT1 TAPERPNT 27 (SUTURE) ×1 IMPLANT
SUT VIC AB 3-0 FS2 27 (SUTURE) ×2 IMPLANT
SUT VLOC 180 0 24IN GS25 (SUTURE) ×2 IMPLANT
SYR 50ML LL SCALE MARK (SYRINGE) ×2 IMPLANT
TOWEL OR 17X24 6PK STRL BLUE (TOWEL DISPOSABLE) ×2 IMPLANT
TOWEL OR 17X26 10 PK STRL BLUE (TOWEL DISPOSABLE) ×2 IMPLANT
TRAY CATH 16FR W/PLASTIC CATH (SET/KITS/TRAYS/PACK) IMPLANT
TRAY REVISION SZ 4 (Knees) ×1 IMPLANT

## 2018-12-25 NOTE — Op Note (Signed)
PREOP DIAGNOSIS: DJD RIGHT KNEE POSTOP DIAGNOSIS: same PROCEDURE: RIGHT TKR ANESTHESIA: Spinal and MAC ATTENDING SURGEON: Hessie Dibble ASSISTANT: Loni Dolly PA  INDICATIONS FOR PROCEDURE: Michele Morrison is a 63 y.o. female who has struggled for a long time with pain due to degenerative arthritis of the right knee.  The patient has failed many conservative non-operative measures and at this point has pain which limits the ability to sleep and walk.  The patient is offered total knee replacement.  Informed operative consent was obtained after discussion of possible risks of anesthesia, infection, neurovascular injury, DVT, and death.  The importance of the post-operative rehabilitation protocol to optimize result was stressed extensively with the patient.  SUMMARY OF FINDINGS AND PROCEDURE:  Michele Morrison was taken to the operative suite where under the above anesthesia a right knee replacement was performed.  There were advanced degenerative changes and the bone quality was good.  We used the DePuy LCS system and placed size standard plus femur, 4 MBT tibia, 35 mm all polyethylene patella, and a size 10 mm spacer.  Loni Dolly PA-C assisted throughout and was invaluable to the completion of the case in that he helped retract and maintain exposure while I placed components.  He also helped close thereby minimizing OR time.  The patient was admitted for appropriate post-op care to include perioperative antibiotics and mechanical and pharmacologic measures for DVT prophylaxis.  DESCRIPTION OF PROCEDURE:  Michele Morrison was taken to the operative suite where the above anesthesia was applied.  The patient was positioned supine and prepped and draped in normal sterile fashion.  An appropriate time out was performed.  After the administration of vancomycin pre-op antibiotic the leg was elevated and exsanguinated and a tourniquet inflated. A standard longitudinal incision was made on the anterior knee.   Dissection was carried down to the extensor mechanism.  All appropriate anti-infective measures were used including the pre-operative antibiotic, betadine impregnated drape, and closed hooded exhaust systems for each member of the surgical team.  A medial parapatellar incision was made in the extensor mechanism and the knee cap flipped and the knee flexed.  Some residual meniscal tissues were removed along with any remaining ACL/PCL tissue.  A guide was placed on the tibia and a flat cut was made on it's superior surface.  An intramedullary guide was placed in the femur and was utilized to make anterior and posterior cuts creating an appropriate flexion gap.  A second intramedullary guide was placed in the femur to make a distal cut properly balancing the knee with an extension gap equal to the flexion gap.  The three bones sized to the above mentioned sizes and the appropriate guides were placed and utilized.  A trial reduction was done and the knee easily came to full extension and the patella tracked well on flexion.  The trial components were removed and all bones were cleaned with pulsatile lavage and then dried thoroughly.  Cement was mixed and was pressurized onto the bones followed by placement of the aforementioned components.  Excess cement was trimmed and pressure was held on the components until the cement had hardened.  The tourniquet was deflated and a small amount of bleeding was controlled with cautery and pressure.  The knee was irrigated thoroughly.  The extensor mechanism was re-approximated with V-loc suture in running fashion.  The knee was flexed and the repair was solid.  The subcutaneous tissues were re-approximated with #0 and #2-0 vicryl and the skin  closed with a subcuticular stitch and steristrips.  A sterile dressing was applied.  Intraoperative fluids, EBL, and tourniquet time can be obtained from anesthesia records.  DISPOSITION:  The patient was taken to recovery room in stable  condition and admitted for appropriate post-op care to include peri-operative antibiotic and DVT prophylaxis with mechanical and pharmacologic measures.  Hessie Dibble 12/25/2018, 2:28 PM

## 2018-12-25 NOTE — Anesthesia Procedure Notes (Signed)
Anesthesia Regional Block: Adductor canal block   Pre-Anesthetic Checklist: ,, timeout performed, Correct Patient, Correct Site, Correct Laterality, Correct Procedure, Correct Position, site marked, Risks and benefits discussed,  Surgical consent,  Pre-op evaluation,  At surgeon's request and post-op pain management  Laterality: Right  Prep: chloraprep       Needles:  Injection technique: Single-shot  Needle Type: Echogenic Stimulator Needle     Needle Length: 9cm  Needle Gauge: 21   Needle insertion depth: 8 cm   Additional Needles:   Procedures:,,,, ultrasound used (permanent image in chart),,,,  Narrative:  Start time: 12/25/2018 12:36 PM End time: 12/25/2018 12:40 PM Injection made incrementally with aspirations every 5 mL.  Performed by: Personally  Anesthesiologist: Josephine Igo, MD  Additional Notes: Timeout performed. Patient sedated. Relevant anatomy ID'd using [place]. Incremental 2-25ml injection of LA with frequent aspiration. Patient tolerated procedure well.         Right Adductor Canal Block

## 2018-12-25 NOTE — Progress Notes (Signed)
Dr. Royce Macadamia, from anesthesia made aware that pt c/o redness and itching under the chin and a small spot under the left breast. New orders received.

## 2018-12-25 NOTE — Interval H&P Note (Signed)
History and Physical Interval Note:  12/25/2018 12:00 PM  Michele Morrison  has presented today for surgery, with the diagnosis of Right Knee Degenerative Joint Disease  The various methods of treatment have been discussed with the patient and family. After consideration of risks, benefits and other options for treatment, the patient has consented to  Procedure(s): TOTAL KNEE ARTHROPLASTY (Right) as a surgical intervention .  The patient's history has been reviewed, patient examined, no change in status, stable for surgery.  I have reviewed the patient's chart and labs.  Questions were answered to the patient's satisfaction.     Hessie Dibble

## 2018-12-25 NOTE — Anesthesia Procedure Notes (Signed)
Spinal  Patient location during procedure: OR Start time: 12/25/2018 1:02 PM End time: 12/25/2018 1:05 PM Staffing Anesthesiologist: Josephine Igo, MD Performed: anesthesiologist  Preanesthetic Checklist Completed: patient identified, site marked, surgical consent, pre-op evaluation, timeout performed, IV checked, risks and benefits discussed and monitors and equipment checked Spinal Block Patient position: sitting Prep: site prepped and draped and DuraPrep Patient monitoring: heart rate, cardiac monitor, continuous pulse ox and blood pressure Approach: midline Location: L3-4 Injection technique: single-shot Needle Needle type: Pencan  Needle gauge: 24 G Needle length: 9 cm Assessment Sensory level: T4 Additional Notes Patient tolerated procedure well. Adequate sensory level.

## 2018-12-25 NOTE — Transfer of Care (Signed)
Immediate Anesthesia Transfer of Care Note  Patient: Tabitha Riggins Ullery  Procedure(s) Performed: TOTAL KNEE ARTHROPLASTY (Right )  Patient Location: PACU  Anesthesia Type:Spinal and MAC combined with regional for post-op pain  Level of Consciousness: awake, alert , oriented and patient cooperative  Airway & Oxygen Therapy: Patient Spontanous Breathing and Patient connected to face mask oxygen  Post-op Assessment: Report given to RN and Post -op Vital signs reviewed and stable  Post vital signs: Reviewed and stable  Last Vitals:  Vitals Value Taken Time  BP    Temp    Pulse 72 12/25/2018  3:05 PM  Resp 14 12/25/2018  3:05 PM  SpO2 100 % 12/25/2018  3:05 PM  Vitals shown include unvalidated device data.  Last Pain:  Vitals:   12/25/18 1505  TempSrc:   PainSc: (P) 0-No pain         Complications: No apparent anesthesia complications

## 2018-12-25 NOTE — Anesthesia Preprocedure Evaluation (Signed)
Anesthesia Evaluation  Patient identified by MRN, date of birth, ID band Patient awake    Reviewed: Allergy & Precautions, NPO status , Patient's Chart, lab work & pertinent test results  Airway Mallampati: II  TM Distance: >3 FB Neck ROM: Full    Dental no notable dental hx. (+) Teeth Intact   Pulmonary neg pulmonary ROS,    Pulmonary exam normal breath sounds clear to auscultation       Cardiovascular hypertension, Pt. on medications Normal cardiovascular exam Rhythm:Regular Rate:Normal     Neuro/Psych negative neurological ROS  negative psych ROS   GI/Hepatic Neg liver ROS, GERD  ,  Endo/Other  Obesity  Renal/GU Renal diseaseHx/o renal calculi  negative genitourinary   Musculoskeletal  (+) Arthritis , Osteoarthritis,  DJD right knee   Abdominal (+) + obese,   Peds  Hematology Hx/o DVT's on anticoagulants- Eliquis- last dose 2/7   Anesthesia Other Findings   Reproductive/Obstetrics                             Anesthesia Physical Anesthesia Plan  ASA: III  Anesthesia Plan: Spinal   Post-op Pain Management:  Regional for Post-op pain   Induction:   PONV Risk Score and Plan:   Airway Management Planned: Natural Airway and Simple Face Mask  Additional Equipment:   Intra-op Plan:   Post-operative Plan:   Informed Consent: I have reviewed the patients History and Physical, chart, labs and discussed the procedure including the risks, benefits and alternatives for the proposed anesthesia with the patient or authorized representative who has indicated his/her understanding and acceptance.     Dental advisory given  Plan Discussed with: CRNA and Surgeon  Anesthesia Plan Comments:         Anesthesia Quick Evaluation

## 2018-12-25 NOTE — Anesthesia Postprocedure Evaluation (Signed)
Anesthesia Post Note  Patient: Michele Morrison  Procedure(s) Performed: TOTAL KNEE ARTHROPLASTY (Right )     Patient location during evaluation: PACU Anesthesia Type: Spinal and Regional Level of consciousness: oriented and awake and alert Pain management: pain level controlled Vital Signs Assessment: post-procedure vital signs reviewed and stable Respiratory status: spontaneous breathing and respiratory function stable Cardiovascular status: blood pressure returned to baseline and stable Postop Assessment: no headache, no backache, no apparent nausea or vomiting, spinal receding and patient able to bend at knees Anesthetic complications: no    Last Vitals:  Vitals:   12/25/18 1550 12/25/18 1600  BP: 126/68   Pulse: 60 63  Resp: 13 14  Temp:    SpO2: 99% 98%    Last Pain:  Vitals:   12/25/18 1631  TempSrc:   PainSc: 8                  Samia Kukla,W. EDMOND

## 2018-12-26 ENCOUNTER — Encounter (HOSPITAL_COMMUNITY): Payer: Self-pay | Admitting: Orthopaedic Surgery

## 2018-12-26 DIAGNOSIS — M1711 Unilateral primary osteoarthritis, right knee: Secondary | ICD-10-CM | POA: Diagnosis not present

## 2018-12-26 NOTE — Evaluation (Signed)
Physical Therapy Evaluation Patient Details Name: Michele Morrison MRN: [id number] DOB: 1956/05/29 Today's Date: 12/26/2018   History of Present Illness  Admitted for R TKA, WBAT;  has a past medical history of Arthritis, Clotting disorder ([place]), Diverticulosis, H/O blood clots, History of kidney stones, HTN (hypertension), Hyperlipidemia, Kidney stones, and Pre-diabetes.  Clinical Impression   Pt is s/p TKA resulting in the deficits listed below (see PT Problem List). Independent prior to admission; Presents to PT with decr functional mobility, decr activity tolerance, nausea and vomiting limitng walking distance;  Pt will benefit from skilled PT to increase their independence and safety with mobility to allow discharge to the venue listed below.    Nausea and vomiting limiting ambulation this morning -- I am hopeful for her to be much improved this afternoon, but also feel that another acute night stay is worth consideration    Follow Up Recommendations Follow surgeon's recommendation for DC plan and follow-up therapies;Supervision/Assistance - 24 hour    Equipment Recommendations  Rolling walker with 5" wheels;3in1 (PT) Pt's sister is looking into getting a RW from church   Recommendations for Other Services       Precautions / Restrictions Precautions Precautions: Knee Precaution Booklet Issued: Yes (comment) Precaution Comments: N/V with activity Restrictions Weight Bearing Restrictions: Yes RLE Weight Bearing: Weight bearing as tolerated      Mobility  Bed Mobility Overal bed mobility: Needs Assistance Bed Mobility: Supine to Sit     Supine to sit: Min guard     General bed mobility comments: Cues for technqiue; slow and inefficient movement, but not needing physical assist  Transfers Overall transfer level: Needs assistance Equipment used: Rolling walker (2 wheeled) Transfers: Sit to/from Stand Sit to Stand: Mod assist;Min assist         General transfer  comment: Mod assist initially from low bed; cues for hand placement and safety; second time with min assist to encourage more forward weight shift, but Roberta able to power up with own power, and heavy dependence on UEs to push on RW to get to fully upright posture  Ambulation/Gait Ambulation/Gait assistance: Min guard Gait Distance (Feet): 30 Feet Assistive device: Rolling walker (2 wheeled) Gait Pattern/deviations: Step-to pattern Gait velocity: slow   General Gait Details: Cues for sequence and to stand tall on RLE in stance; slow moving; became nauseated and needed to sit; + emesis  Stairs            Wheelchair Mobility    Modified Rankin (Stroke Patients Only)       Balance                                             Pertinent Vitals/Pain Pain Assessment: Faces Faces Pain Scale: Hurts whole lot Pain Location: R knee at end range flexion Pain Descriptors / Indicators: Grimacing Pain Intervention(s): Monitored during session;RN gave pain meds during session    Home Living Family/patient expects to be discharged to:: Private residence Living Arrangements: Alone Available Help at Discharge: Family(sister coming in from [place]) Type of Home: House Home Access: Stairs to enter Entrance Stairs-Rails: None Entrance Stairs-Number of Steps: 1 Home Layout: One level Home Equipment: Environmental consultant - 4 wheels;Bedside commode      Prior Function Level of Independence: Independent         Comments: Pastry chef     Hand Dominance  Extremity/Trunk Assessment   Upper Extremity Assessment Upper Extremity Assessment: Overall WFL for tasks assessed    Lower Extremity Assessment Lower Extremity Assessment: RLE deficits/detail RLE Deficits / Details: Grossly decr AROM and strength, limited by pain postop; Range approx 4-60 deg; Good quad activation       Communication   Communication: No difficulties  Cognition Arousal/Alertness:  Awake/alert Behavior During Therapy: WFL for tasks assessed/performed Overall Cognitive Status: Within Functional Limits for tasks assessed                                        General Comments      Exercises     Assessment/Plan    PT Assessment Patient needs continued PT services  PT Problem List Decreased strength;Decreased range of motion;Decreased activity tolerance;Decreased balance;Decreased mobility;Decreased knowledge of use of DME;Decreased knowledge of precautions;Pain       PT Treatment Interventions DME instruction;Gait training;Stair training;Functional mobility training;Therapeutic activities;Therapeutic exercise;Balance training;Patient/family education    PT Goals (Current goals can be found in the Care Plan section)  Acute Rehab PT Goals Patient Stated Goal: walk without pain PT Goal Formulation: With patient Time For Goal Achievement: 01/09/19 Potential to Achieve Goals: Good    Frequency 7X/week   Barriers to discharge        Co-evaluation               AM-PAC PT "6 Clicks" Mobility  Outcome Measure Help needed turning from your back to your side while in a flat bed without using bedrails?: None Help needed moving from lying on your back to sitting on the side of a flat bed without using bedrails?: None Help needed moving to and from a bed to a chair (including a wheelchair)?: A Little Help needed standing up from a chair using your arms (e.g., wheelchair or bedside chair)?: A Little Help needed to walk in hospital room?: A Little Help needed climbing 3-5 steps with a railing? : A Little 6 Click Score: 20    End of Session Equipment Utilized During Treatment: Gait belt Activity Tolerance: Patient tolerated treatment well Patient left: in chair;with call bell/phone within reach Nurse Communication: Mobility status PT Visit Diagnosis: Unsteadiness on feet (R26.81);Other abnormalities of gait and mobility (R26.89);Pain Pain -  Right/Left: Right Pain - part of body: Knee    Time: 0837-0915(end time is approximate) PT Time Calculation (min) (ACUTE ONLY): 38 min   Charges:   PT Evaluation $PT Eval Moderate Complexity: 1 Mod PT Treatments $Gait Training: 8-22 mins $Therapeutic Activity: 8-22 mins        Roney Marion, PT  Acute Rehabilitation Services Pager [phone] Office [place] 12/26/2018, 12:04 PM

## 2018-12-26 NOTE — Care Management Note (Signed)
Case Management Note  Patient Details  Name: Michele Morrison MRN: [id number] Date of Birth: 07/30/1956  Subjective/Objective:   63 yr old female s/p right total knee arthroplasty secondary to osteoarthritis of right knee.                  Action/Plan: Patient was preoperatively setup with [place], no changes. Has all necessary DME at Home. Patient's sister will be staying with her during her recovery.    Expected Discharge Date:     12/27/18             Expected Discharge Plan:  [place]  In-House Referral:  NA  Discharge planning Services  CM Consult  Post Acute Care Choice:  Home Health Choice offered to:  Patient  DME Arranged:  N/A(has DME) DME Agency:  NA  HH Arranged:  PT HH Agency:  [facility]  Status of Service:  Completed, signed off  If discussed at [place] of Stay Meetings, dates discussed:    Additional Comments:  Michele, [name], RN 12/26/2018, 3:18 PM

## 2018-12-26 NOTE — Progress Notes (Signed)
Physical Therapy Treatment Patient Details Name: Michele Morrison MRN: [id number] DOB: 18-Jul-1956 Today's Date: 12/26/2018    History of Present Illness Admitted for R TKA, WBAT;  has a past medical history of Arthritis, Clotting disorder ([place]), Diverticulosis, H/O blood clots, History of kidney stones, HTN (hypertension), Hyperlipidemia, Kidney stones, and Pre-diabetes.    PT Comments    Continuing work on functional mobility and activity tolerance; Much better activity tolerance than this morning; on track for dc hoem tomorrow; at this point I rec 2 PT sessions tomorrow   Follow Up Recommendations  Follow surgeon's recommendation for DC plan and follow-up therapies;Supervision/Assistance - 24 hour     Equipment Recommendations  Rolling walker with 5" wheels;3in1 (PT)    Recommendations for Other Services       Precautions / Restrictions Precautions Precautions: Knee Precaution Booklet Issued: Yes (comment) Precaution Comments: N/V with activity Restrictions RLE Weight Bearing: Weight bearing as tolerated    Mobility  Bed Mobility Overal bed mobility: Needs Assistance Bed Mobility: Sit to Supine       Sit to supine: Min guard   General bed mobility comments: Cues for technique; Slow moving, but able to lift RLE  Transfers Overall transfer level: Needs assistance Equipment used: Rolling walker (2 wheeled) Transfers: Sit to/from Stand Sit to Stand: Min guard         General transfer comment: Good use of armrest to push up; stood from recliner and toilet (3in1 over it); Good hand placement  Ambulation/Gait Ambulation/Gait assistance: Min guard Gait Distance (Feet): 25 Feet Assistive device: Rolling walker (2 wheeled) Gait Pattern/deviations: Step-through pattern(emerging) Gait velocity: slow   General Gait Details: Cues to activate quad for R stance stability; no buckling noted   Stairs             Wheelchair Mobility    Modified Rankin (Stroke  Patients Only)       Balance Overall balance assessment: Needs assistance   Sitting balance-Leahy Scale: Fair       Standing balance-Leahy Scale: Poor(approaching Fair)                              Cognition Arousal/Alertness: Awake/alert Behavior During Therapy: WFL for tasks assessed/performed Overall Cognitive Status: Within Functional Limits for tasks assessed                                        Exercises Total Joint Exercises Ankle Circles/Pumps: AROM;10 reps;Both Quad Sets: AROM;Right;10 reps Short Arc Quad: AROM;Right;10 reps Heel Slides: AAROM;Right;10 reps Straight Leg Raises: AAROM;Right;10 reps    General Comments        Pertinent Vitals/Pain Pain Assessment: Faces Faces Pain Scale: Hurts whole lot Pain Location: R knee at end range flexion Pain Descriptors / Indicators: Grimacing Pain Intervention(s): Monitored during session;Premedicated before session    Home Living                      Prior Function            PT Goals (current goals can now be found in the care plan section) Acute Rehab PT Goals Patient Stated Goal: [place] cruise in Aug PT Goal Formulation: With patient Time For Goal Achievement: 01/09/19 Potential to Achieve Goals: Good    Frequency    7X/week      PT Plan Current  plan remains appropriate    Co-evaluation              AM-PAC PT "6 Clicks" Mobility   Outcome Measure  Help needed turning from your back to your side while in a flat bed without using bedrails?: None Help needed moving from lying on your back to sitting on the side of a flat bed without using bedrails?: None Help needed moving to and from a bed to a chair (including a wheelchair)?: A Little Help needed standing up from a chair using your arms (e.g., wheelchair or bedside chair)?: A Little Help needed to walk in hospital room?: A Little Help needed climbing 3-5 steps with a railing? : A Little 6 Click  Score: 20    End of Session Equipment Utilized During Treatment: Gait belt Activity Tolerance: Patient tolerated treatment well Patient left: in bed;with call bell/phone within reach Nurse Communication: Mobility status PT Visit Diagnosis: Unsteadiness on feet (R26.81);Other abnormalities of gait and mobility (R26.89);Pain Pain - Right/Left: Right Pain - part of body: Knee     Time: 1346-1430 PT Time Calculation (min) (ACUTE ONLY): 44 min  Charges:  $Gait Training: 8-22 mins $Therapeutic Exercise: 8-22 mins                     Roney Marion, PT  Acute Rehabilitation Services Pager [phone] Office [place] 12/26/2018, 5:01 PM

## 2018-12-26 NOTE — Progress Notes (Addendum)
Subjective: 1 Day Post-Op Procedure(s) (LRB): TOTAL KNEE ARTHROPLASTY (Right)   Patient has not yet walked with PT. She is hoping to go home today if she walks well.  Activity level:  wbat Diet tolerance:  ok Voiding:  Foley out this morning Patient reports pain as mild.    Objective: Vital signs in last 24 hours: Temp:  [97.5 F (36.4 C)-98.3 F (36.8 C)] 97.8 F (36.6 C) (02/12 0411) Pulse Rate:  [57-85] 85 (02/12 0411) Resp:  [10-26] 18 (02/12 0411) BP: (111-169)/(58-85) 117/64 (02/12 0411) SpO2:  [95 %-100 %] 98 % (02/12 0411) Weight:  [109.5 kg] 109.5 kg (02/11 1107)  Labs: No results for input(s): HGB in the last 72 hours. No results for input(s): WBC, RBC, HCT, PLT in the last 72 hours. No results for input(s): NA, K, CL, CO2, BUN, CREATININE, GLUCOSE, CALCIUM in the last 72 hours. Recent Labs    12/25/18 1113  INR 1.04    Physical Exam:  Neurologically intact ABD soft Neurovascular intact Sensation intact distally Intact pulses distally Dorsiflexion/Plantar flexion intact Incision: dressing C/D/I and no drainage No cellulitis present Compartment soft  Assessment/Plan:  1 Day Post-Op Procedure(s) (LRB): TOTAL KNEE ARTHROPLASTY (Right) Advance diet Up with therapy Discharge home with home health possibly today if doing well and cleared by PT. Continue on home Xarelto for DVT prevention. Follow-up in office 2 weeks postop. I will check back with the patient after lunch to check on her progress and see if they want to go home.  Patient's anticipated LOS is less than 2 midnights, meeting these requirements: - Younger than 32 - Lives within 1 hour of care - Has a competent adult at home to recover with post-op recover - NO history of  - Chronic pain requiring opiods  - Diabetes  - Coronary Artery Disease  - Heart failure  - Heart attack  - Stroke  - DVT/VTE  - Cardiac arrhythmia  - Respiratory Failure/COPD  - Renal failure  - Anemia  - Advanced  Liver disease   Michele Morrison Michele Morrison 12/26/2018, 8:31 AM

## 2018-12-26 NOTE — Discharge Instructions (Signed)

## 2018-12-27 DIAGNOSIS — M1711 Unilateral primary osteoarthritis, right knee: Secondary | ICD-10-CM | POA: Diagnosis not present

## 2018-12-27 MED ORDER — HYDROCODONE-ACETAMINOPHEN 5-325 MG PO TABS: 1.0000 | ORAL_TABLET | Freq: Four times a day (QID) | ORAL | 0 refills | Status: DC | PRN

## 2018-12-27 MED ORDER — TIZANIDINE HCL 4 MG PO TABS: 4.0000 mg | ORAL_TABLET | Freq: Four times a day (QID) | ORAL | 1 refills | Status: DC | PRN

## 2018-12-27 NOTE — Plan of Care (Signed)

## 2018-12-27 NOTE — Progress Notes (Signed)
Physical Therapy Treatment Patient Details Name: Michele Morrison MRN: [id number] DOB: 02-25-1956 Today's Date: 12/27/2018    History of Present Illness Admitted for R TKA, WBAT;  has a past medical history of Arthritis, Clotting disorder ([place]), Diverticulosis, H/O blood clots, History of kidney stones, HTN (hypertension), Hyperlipidemia, Kidney stones, and Pre-diabetes.    PT Comments    Patient seen for mobility progression. Pt is making good progress with gait and tolerated stair training well this am. Will need to complete therex next session. Current plan remains appropriate.    Follow Up Recommendations  Follow surgeon's recommendation for DC plan and follow-up therapies;Supervision/Assistance - 24 hour     Equipment Recommendations  Rolling walker with 5" wheels;3in1 (PT)    Recommendations for Other Services       Precautions / Restrictions Precautions Precautions: Knee Precaution Comments: reviewed precautions/positioning with pt Restrictions Weight Bearing Restrictions: Yes RLE Weight Bearing: Weight bearing as tolerated    Mobility  Bed Mobility               General bed mobility comments: pt OOB in chair upon arrival   Transfers Overall transfer level: Needs assistance Equipment used: Rolling walker (2 wheeled) Transfers: Sit to/from Stand Sit to Stand: Min guard         General transfer comment: for safety  Ambulation/Gait Ambulation/Gait assistance: Min guard;Supervision Gait Distance (Feet): 140 Feet Assistive device: Rolling walker (2 wheeled) Gait Pattern/deviations: Step-through pattern;Decreased stride length;Decreased weight shift to right(emerging) Gait velocity: slow   General Gait Details: cues for posture   Stairs Stairs: Yes Stairs assistance: Min assist Stair Management: No rails;Step to pattern;Forwards;With walker   General stair comments: practiced single step X 2 trials with assist to stabilize RW and cues for  sequencing/technique   Wheelchair Mobility    Modified Rankin (Stroke Patients Only)       Balance     Sitting balance-Leahy Scale: Good       Standing balance-Leahy Scale: Poor                              Cognition Arousal/Alertness: Awake/alert Behavior During Therapy: WFL for tasks assessed/performed Overall Cognitive Status: Within Functional Limits for tasks assessed                                        Exercises Total Joint Exercises Long Arc Quad: AROM;AAROM;Right Knee Flexion: AROM;AAROM;Right    General Comments        Pertinent Vitals/Pain Pain Assessment: Faces Faces Pain Scale: (4 ambulation/ 8 therex) Pain Location: R knee Pain Descriptors / Indicators: Grimacing;Guarding;Moaning Pain Intervention(s): Limited activity within patient's tolerance;Monitored during session;Premedicated before session;Repositioned    Home Living                      Prior Function            PT Goals (current goals can now be found in the care plan section) Progress towards PT goals: Progressing toward goals    Frequency    7X/week      PT Plan Current plan remains appropriate    Co-evaluation              AM-PAC PT "6 Clicks" Mobility   Outcome Measure  Help needed turning from your back to your side while in a flat  bed without using bedrails?: None Help needed moving from lying on your back to sitting on the side of a flat bed without using bedrails?: None Help needed moving to and from a bed to a chair (including a wheelchair)?: A Little Help needed standing up from a chair using your arms (e.g., wheelchair or bedside chair)?: A Little Help needed to walk in hospital room?: A Little Help needed climbing 3-5 steps with a railing? : A Little 6 Click Score: 20    End of Session Equipment Utilized During Treatment: Gait belt Activity Tolerance: Patient tolerated treatment well Patient left: with call  bell/phone within reach;in chair Nurse Communication: Mobility status PT Visit Diagnosis: Unsteadiness on feet (R26.81);Other abnormalities of gait and mobility (R26.89);Pain Pain - Right/Left: Right Pain - part of body: Knee     Time: 6438-3818 PT Time Calculation (min) (ACUTE ONLY): 45 min  Charges:  $Gait Training: 23-37 mins $Therapeutic Exercise: 8-22 mins                     Earney Navy, PTA Acute Rehabilitation Services Pager: [phone] Office: [phone]     Darliss Cheney 12/27/2018, 10:42 AM

## 2018-12-27 NOTE — Progress Notes (Signed)
Subjective: 2 Days Post-Op Procedure(s) (LRB): TOTAL KNEE ARTHROPLASTY (Right)   patient feeling better and wants to go home today.  Activity level:  wbat Diet tolerance:  ok Voiding:  ok Patient reports pain as mild.    Objective: Vital signs in last 24 hours: Temp:  [97.7 F (36.5 C)-97.8 F (36.6 C)] 97.7 F (36.5 C) (02/13 0441) Pulse Rate:  [52-58] 58 (02/13 0441) Resp:  [16-20] 20 (02/13 0441) BP: (126-144)/(68-69) 144/69 (02/13 0441) SpO2:  [96 %-99 %] 99 % (02/13 0441)  Labs: No results for input(s): HGB in the last 72 hours. No results for input(s): WBC, RBC, HCT, PLT in the last 72 hours. No results for input(s): NA, K, CL, CO2, BUN, CREATININE, GLUCOSE, CALCIUM in the last 72 hours. Recent Labs    12/25/18 1113  INR 1.04    Physical Exam:  Neurologically intact ABD soft Neurovascular intact Sensation intact distally Intact pulses distally Dorsiflexion/Plantar flexion intact Incision: dressing C/D/I and no drainage No cellulitis present Compartment soft  Assessment/Plan:  2 Days Post-Op Procedure(s) (LRB): TOTAL KNEE ARTHROPLASTY (Right) Advance diet Up with therapy Discharge home with home health today after PT if doing well. Continue on home Xarelto for DVT prevention. Follow up in office 2 weeks post op.  Michele Morrison 12/27/2018, 6:39 AM

## 2018-12-27 NOTE — Discharge Summary (Signed)
Patient ID: Michele Morrison MRN: [id number] DOB/AGE: [birth year] 63 y.o.  Admit date: 12/25/2018 Discharge date: 12/27/2018  Admission Diagnoses:  Principal Problem:   Primary localized osteoarthritis of right knee Active Problems:   Primary osteoarthritis of right knee   Discharge Diagnoses:  Same  Past Medical History:  Diagnosis Date  . Arthritis    thumbs,spine  . 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 Diverticulosis   . H/O blood clots   . History of kidney stones   . HTN (hypertension)   . Hyperlipidemia   . Kidney stones   . Pre-diabetes     Surgeries: Procedure(s): TOTAL KNEE ARTHROPLASTY on 12/25/2018   Consultants:   Discharged Condition: Improved  Hospital Course: Michele Morrison is an 62 y.o. female who was admitted 12/25/2018 for operative treatment ofPrimary localized osteoarthritis of right knee. Patient has severe unremitting pain that affects sleep, daily activities, and work/hobbies. After pre-op clearance the patient was taken to the operating room on 12/25/2018 and underwent  Procedure(s): TOTAL KNEE ARTHROPLASTY.    Patient was given perioperative antibiotics:  Anti-infectives (From admission, onward)   Start     Dose/Rate Route Frequency Ordered Stop   12/25/18 1130  vancomycin (VANCOCIN) 1,500 mg in sodium chloride 0.9 % 500 mL IVPB     1,500 mg 250 mL/hr over 120 Minutes Intravenous To ShortStay [facility] 12/24/18 1336 12/25/18 1323       Patient was given sequential compression devices, early ambulation, and chemoprophylaxis to prevent DVT.  Patient benefited maximally from hospital stay and there were no complications.    Recent vital signs:  Patient Vitals for the past 24 hrs:  BP Temp Temp src Pulse Resp SpO2  12/27/18 0441 (!) 144/69 97.7 F (36.5 C) Oral (!) 58 20 99 %  12/26/18 1344 134/69 97.7 F (36.5 C) Oral (!) 55 17 98 %  12/26/18 1038 126/68 97.8 F (36.6 C) Oral (!) 52 16 96 %     Recent laboratory studies:  Recent Labs     12/25/18 1113  INR 1.04     Discharge Medications:   Allergies as of 12/27/2018      Reactions   Cephalexin Rash   Vancomycin Rash   Received during knee surgery 12/25/18, developed flushed skin and rash, resolved with administration of benadryl   Penicillins Other (See Comments)   Did it involve swelling of the face/tongue/throat, SOB, or low BP? Unknown-patient has NEVER taken  Did it involve sudden or severe rash/hives, skin peeling, or any reaction on the inside of your mouth or nose? Unknown-patient has NEVER taken  Did you need to seek medical attention at a hospital or doctor's office? Unknown-patient has NEVER taken  When did it last happen?patient has NEVER taken this medication If all above answers are "NO", may proceed with cephalosporin use.      Medication List    TAKE these medications   fluconazole 200 MG tablet Commonly known as:  DIFLUCAN Take one tablet today followed by second tablet if no resolution after 72 hours.   hydrochlorothiazide 25 MG tablet Commonly known as:  HYDRODIURIL TAKE 1 TABLET BY MOUTH EVERY DAY What changed:    how much to take  how to take this  when to take this  additional instructions   HYDROcodone-acetaminophen 5-325 MG tablet Commonly known as:  NORCO/VICODIN Take 1-2 tablets by mouth every 6 (six) hours as needed for moderate pain (pain score 4-6).   pantoprazole 40 MG tablet Commonly known  as:  PROTONIX TAKE 1 TABLET BY MOUTH EVERY DAY   tiZANidine 4 MG tablet Commonly known as:  ZANAFLEX Take 1 tablet (4 mg total) by mouth every 6 (six) hours as needed.   XARELTO 20 MG Tabs tablet Generic drug:  rivaroxaban TAKE 1 TABLET BY MOUTH EVERY DAY WITH SUPPER What changed:  See the new instructions.            Durable Medical Equipment  (From admission, onward)         Start     Ordered   12/25/18 1814  DME Walker rolling  Once    Question:  Patient needs a walker to treat with the following condition   Answer:  Primary osteoarthritis of right knee   12/25/18 1813   12/25/18 1814  DME 3 n 1  Once     12/25/18 1813   12/25/18 1814  DME Bedside commode  Once    Question:  Patient needs a bedside commode to treat with the following condition  Answer:  Primary osteoarthritis of right knee   12/25/18 1813          Diagnostic Studies: Dg Chest 2 View  Result Date: 12/17/2018 CLINICAL DATA:  Preoperative examination. Patient for right total knee replacement. EXAM: CHEST - 2 VIEW COMPARISON:  PA and lateral chest 04/25/2017. FINDINGS: The lungs are clear. Heart size is normal. No pneumothorax or pleural effusion. No acute or focal bony abnormality. IMPRESSION: No acute disease. Electronically Signed   By: Inge Rise M.D.   On: 12/17/2018 15:37    Disposition: Discharge disposition: 01-Home or Self Care       Discharge Instructions    Call MD / Call 911   Complete by:  As directed    If you experience chest pain or shortness of breath, CALL 911 and be transported to the hospital emergency room.  If you develope a fever above 101 F, pus (white drainage) or increased drainage or redness at the wound, or calf pain, call your surgeon's office.   Constipation Prevention   Complete by:  As directed    Drink plenty of fluids.  Prune juice may be helpful.  You may use a stool softener, such as Colace (over the counter) 100 mg twice a day.  Use MiraLax (over the counter) for constipation as needed.   Diet - low sodium heart healthy   Complete by:  As directed    Discharge instructions   Complete by:  As directed    INSTRUCTIONS AFTER JOINT REPLACEMENT   Remove items at home which could result in a fall. This includes throw rugs or furniture in walking pathways ICE to the affected joint every three hours while awake for 30 minutes at a time, for at least the first 3-5 days, and then as needed for pain and swelling.  Continue to use ice for pain and swelling. You may notice swelling that  will progress down to the foot and ankle.  This is normal after surgery.  Elevate your leg when you are not up walking on it.   Continue to use the breathing machine you got in the hospital (incentive spirometer) which will help keep your temperature down.  It is common for your temperature to cycle up and down following surgery, especially at night when you are not up moving around and exerting yourself.  The breathing machine keeps your lungs expanded and your temperature down.   DIET:  As you were doing prior to hospitalization,  we recommend a well-balanced diet.  DRESSING / WOUND CARE / SHOWERING  You may shower 3 days after surgery, but keep the wounds dry during showering.  You may use an occlusive plastic wrap (Press'n Seal for example), NO SOAKING/SUBMERGING IN THE BATHTUB.  If the bandage gets wet, change with a clean dry gauze.  If the incision gets wet, pat the wound dry with a clean towel.  ACTIVITY  Increase activity slowly as tolerated, but follow the weight bearing instructions below.   No driving for 6 weeks or until further direction given by your physician.  You cannot drive while taking narcotics.  No lifting or carrying greater than 10 lbs. until further directed by your surgeon. Avoid periods of inactivity such as sitting longer than an hour when not asleep. This helps prevent blood clots.  You may return to work once you are authorized by your doctor.     WEIGHT BEARING   Weight bearing as tolerated with assist device (walker, cane, etc) as directed, use it as long as suggested by your surgeon or therapist, typically at least 4-6 weeks.   EXERCISES  Results after joint replacement surgery are often greatly improved when you follow the exercise, range of motion and muscle strengthening exercises prescribed by your doctor. Safety measures are also important to protect the joint from further injury. Any time any of these exercises cause you to have increased pain or  swelling, decrease what you are doing until you are comfortable again and then slowly increase them. If you have problems or questions, call your caregiver or physical therapist for advice.   Rehabilitation is important following a joint replacement. After just a few days of immobilization, the muscles of the leg can become weakened and shrink (atrophy).  These exercises are designed to build up the tone and strength of the thigh and leg muscles and to improve motion. Often times heat used for twenty to thirty minutes before working out will loosen up your tissues and help with improving the range of motion but do not use heat for the first two weeks following surgery (sometimes heat can increase post-operative swelling).   These exercises can be done on a training (exercise) mat, on the floor, on a table or on a bed. Use whatever works the best and is most comfortable for you.    Use music or television while you are exercising so that the exercises are a pleasant break in your day. This will make your life better with the exercises acting as a break in your routine that you can look forward to.   Perform all exercises about fifteen times, three times per day or as directed.  You should exercise both the operative leg and the other leg as well.   Exercises include:   Quad Sets - Tighten up the muscle on the front of the thigh (Quad) and hold for 5-10 seconds.   Straight Leg Raises - With your knee straight (if you were given a brace, keep it on), lift the leg to 60 degrees, hold for 3 seconds, and slowly lower the leg.  Perform this exercise against resistance later as your leg gets stronger.  Leg Slides: Lying on your back, slowly slide your foot toward your buttocks, bending your knee up off the floor (only go as far as is comfortable). Then slowly slide your foot back down until your leg is flat on the floor again.  Angel Wings: Lying on your back spread your legs to the  side as far apart as you can  without causing discomfort.  Hamstring Strength:  Lying on your back, push your heel against the floor with your leg straight by tightening up the muscles of your buttocks.  Repeat, but this time bend your knee to a comfortable angle, and push your heel against the floor.  You may put a pillow under the heel to make it more comfortable if necessary.   A rehabilitation program following joint replacement surgery can speed recovery and prevent re-injury in the future due to weakened muscles. Contact your doctor or a physical therapist for more information on knee rehabilitation.    CONSTIPATION  Constipation is defined medically as fewer than three stools per week and severe constipation as less than one stool per week.  Even if you have a regular bowel pattern at home, your normal regimen is likely to be disrupted due to multiple reasons following surgery.  Combination of anesthesia, postoperative narcotics, change in appetite and fluid intake all can affect your bowels.   YOU MUST use at least one of the following options; they are listed in order of increasing strength to get the job done.  They are all available over the counter, and you may need to use some, POSSIBLY even all of these options:    Drink plenty of fluids (prune juice may be helpful) and high fiber foods Colace 100 mg by mouth twice a day  Senokot for constipation as directed and as needed Dulcolax (bisacodyl), take with full glass of water  Miralax (polyethylene glycol) once or twice a day as needed.  If you have tried all these things and are unable to have a bowel movement in the first 3-4 days after surgery call either your surgeon or your primary doctor.    If you experience loose stools or diarrhea, hold the medications until you stool forms back up.  If your symptoms do not get better within 1 week or if they get worse, check with your doctor.  If you experience "the worst abdominal pain ever" or develop nausea or vomiting,  please contact the office immediately for further recommendations for treatment.   ITCHING:  If you experience itching with your medications, try taking only a single pain pill, or even half a pain pill at a time.  You can also use Benadryl over the counter for itching or also to help with sleep.   TED HOSE STOCKINGS:  Use stockings on both legs until for at least 2 weeks or as directed by physician office. They may be removed at night for sleeping.  MEDICATIONS:  See your medication summary on the "After Visit Summary" that nursing will review with you.  You may have some home medications which will be placed on hold until you complete the course of blood thinner medication.  It is important for you to complete the blood thinner medication as prescribed.  PRECAUTIONS:  If you experience chest pain or shortness of breath - call 911 immediately for transfer to the hospital emergency department.   If you develop a fever greater that 101 F, purulent drainage from wound, increased redness or drainage from wound, foul odor from the wound/dressing, or calf pain - CONTACT YOUR SURGEON.  FOLLOW-UP APPOINTMENTS:  If you do not already have a post-op appointment, please call the office for an appointment to be seen by your surgeon.  Guidelines for how soon to be seen are listed in your "After Visit Summary", but are typically between 1-4 weeks after surgery.  OTHER INSTRUCTIONS:   Knee Replacement:  Do not place pillow under knee, focus on keeping the knee straight while resting. CPM instructions: 0-90 degrees, 2 hours in the morning, 2 hours in the afternoon, and 2 hours in the evening. Place foam block, curve side up under heel at all times except when in CPM or when walking.  DO NOT modify, tear, cut, or change the foam block in any way.  MAKE SURE YOU:  Understand these instructions.  Get help right away if you are not doing well or get worse.     Thank you for letting us be a part of your medical care team.  It is a privilege we respect greatly.  We hope these instructions will help you stay on track for a fast and full recovery!   Increase activity slowly as tolerated   Complete by:  As directed       Follow-up Information    Melrose Nakayama, MD. Schedule an appointment as soon as possible for a visit in 2 weeks.   Specialty:  Orthopedic Surgery Contact information: [place] [address] [address], [facility] Follow up.   Specialty:  [place] Why:  A representative from [facility] will contact you to arrange start date and time for your therapy. Contact information: [address] [place] [address] [phone]            Signed: [name] Gertrude Bucks 12/27/2018, 6:42 AM

## 2018-12-27 NOTE — Progress Notes (Signed)
Physical Therapy Treatment Patient Details Name: Michele Morrison MRN: [id number] DOB: 1956/01/05 Today's Date: 12/27/2018    History of Present Illness Admitted for R TKA, WBAT;  has a past medical history of Arthritis, Clotting disorder ([place]), Diverticulosis, H/O blood clots, History of kidney stones, HTN (hypertension), Hyperlipidemia, Kidney stones, and Pre-diabetes.    PT Comments    Patient seen for mobility progression. Pt ambulated 120 ft with supervision/min guard for safety and completed her HEP with assistance. Sister present and will be assisting at discharge. Current plan remains appropriate.    Follow Up Recommendations  Follow surgeon's recommendation for DC plan and follow-up therapies;Supervision/Assistance - 24 hour     Equipment Recommendations  Rolling walker with 5" wheels;3in1 (PT)    Recommendations for Other Services       Precautions / Restrictions Precautions Precautions: Knee Precaution Comments: reviewed precautions/positioning with pt Restrictions Weight Bearing Restrictions: Yes RLE Weight Bearing: Weight bearing as tolerated    Mobility  Bed Mobility Overal bed mobility: Needs Assistance Bed Mobility: Supine to Sit;Sit to Supine     Supine to sit: Min guard Sit to supine: Min assist   General bed mobility comments: assist to bring R LE into bed  Transfers Overall transfer level: Needs assistance Equipment used: Rolling walker (2 wheeled) Transfers: Sit to/from Stand Sit to Stand: Min guard         General transfer comment: for safety  Ambulation/Gait Ambulation/Gait assistance: Supervision;Min guard Gait Distance (Feet): 120 Feet Assistive device: Rolling walker (2 wheeled) Gait Pattern/deviations: Step-through pattern;Decreased stride length;Decreased weight shift to right(emerging) Gait velocity: decreased   General Gait Details: cues for posture; heavy reliance on UE support; improving step through pattern     Stairs Stairs: Yes Stairs assistance: Min assist Stair Management: No rails;Step to pattern;Forwards;With walker   General stair comments: practiced single step X 2 trials with assist to stabilize RW and cues for sequencing/technique   Wheelchair Mobility    Modified Rankin (Stroke Patients Only)       Balance     Sitting balance-Leahy Scale: Good       Standing balance-Leahy Scale: Poor                              Cognition Arousal/Alertness: Awake/alert Behavior During Therapy: WFL for tasks assessed/performed Overall Cognitive Status: Within Functional Limits for tasks assessed                                        Exercises Total Joint Exercises Short Arc Quad: AAROM;Strengthening;Right;5 reps Heel Slides: AROM;Strengthening;Right;10 reps Hip ABduction/ADduction: AROM;Strengthening;Right;10 reps Straight Leg Raises: AAROM;Strengthening;Right;5 reps Long Arc Quad: AROM;AAROM;Right Knee Flexion: AROM;AAROM;Right    General Comments        Pertinent Vitals/Pain Pain Assessment: Faces Faces Pain Scale: Hurts little more Pain Location: R knee Pain Descriptors / Indicators: Grimacing;Guarding Pain Intervention(s): Limited activity within patient's tolerance;Monitored during session;Premedicated before session;Repositioned    Home Living                      Prior Function            PT Goals (current goals can now be found in the care plan section) Progress towards PT goals: Progressing toward goals    Frequency    7X/week      PT Plan Current  plan remains appropriate    Co-evaluation              AM-PAC PT "6 Clicks" Mobility   Outcome Measure  Help needed turning from your back to your side while in a flat bed without using bedrails?: None Help needed moving from lying on your back to sitting on the side of a flat bed without using bedrails?: None Help needed moving to and from a bed to a  chair (including a wheelchair)?: A Little Help needed standing up from a chair using your arms (e.g., wheelchair or bedside chair)?: A Little Help needed to walk in hospital room?: A Little Help needed climbing 3-5 steps with a railing? : A Little 6 Click Score: 20    End of Session Equipment Utilized During Treatment: Gait belt Activity Tolerance: Patient tolerated treatment well Patient left: with call bell/phone within reach;in bed;with family/visitor present Nurse Communication: Mobility status PT Visit Diagnosis: Unsteadiness on feet (R26.81);Other abnormalities of gait and mobility (R26.89);Pain Pain - Right/Left: Right Pain - part of body: Knee     Time: 5176-1607 PT Time Calculation (min) (ACUTE ONLY): 33 min  Charges:  $Gait Training: 8-22 mins $Therapeutic Exercise: 8-22 mins                     Earney Navy, PTA Acute Rehabilitation Services Pager: [phone] Office: [phone]     Darliss Cheney 12/27/2018, 2:25 PM

## 2018-12-27 NOTE — Plan of Care (Signed)
Problem: Education: Goal: Knowledge of the prescribed therapeutic regimen will improve 12/27/2018 1520 by Youlanda Roys, RN Outcome: Completed/Met 12/27/2018 1303 by Youlanda Roys, RN Outcome: Progressing Goal: Individualized Educational Video(s) 12/27/2018 1520 by Youlanda Roys, RN Outcome: Completed/Met 12/27/2018 1303 by Youlanda Roys, RN Outcome: Progressing   Problem: Activity: Goal: Ability to avoid complications of mobility impairment will improve 12/27/2018 1520 by Youlanda Roys, RN Outcome: Completed/Met 12/27/2018 1303 by Youlanda Roys, RN Outcome: Progressing Goal: Range of joint motion will improve 12/27/2018 1520 by Youlanda Roys, RN Outcome: Completed/Met 12/27/2018 1303 by Youlanda Roys, RN Outcome: Progressing   Problem: Clinical Measurements: Goal: Postoperative complications will be avoided or minimized 12/27/2018 1520 by Youlanda Roys, RN Outcome: Completed/Met 12/27/2018 1303 by Youlanda Roys, RN Outcome: Progressing   Problem: Pain Management: Goal: Pain level will decrease with appropriate interventions 12/27/2018 1520 by Youlanda Roys, RN Outcome: Completed/Met 12/27/2018 1303 by Youlanda Roys, RN Outcome: Progressing   Problem: Skin Integrity: Goal: Will show signs of wound healing 12/27/2018 1520 by Youlanda Roys, RN Outcome: Completed/Met 12/27/2018 1303 by Youlanda Roys, RN Outcome: Progressing   Problem: Education: Goal: Knowledge of General Education information will improve Description Including pain rating scale, medication(s)/side effects and non-pharmacologic comfort measures 12/27/2018 1520 by Youlanda Roys, RN Outcome: Completed/Met 12/27/2018 1303 by Youlanda Roys, RN Outcome: Progressing   Problem: Health Behavior/Discharge Planning: Goal: Ability to manage health-related needs will improve 12/27/2018 1520 by Youlanda Roys, RN Outcome: Completed/Met 12/27/2018 1303 by Youlanda Roys, RN Outcome: Progressing   Problem: Clinical  Measurements: Goal: Ability to maintain clinical measurements within normal limits will improve 12/27/2018 1520 by Youlanda Roys, RN Outcome: Completed/Met 12/27/2018 1303 by Youlanda Roys, RN Outcome: Progressing Goal: Will remain free from infection 12/27/2018 1520 by Youlanda Roys, RN Outcome: Completed/Met 12/27/2018 1303 by Youlanda Roys, RN Outcome: Progressing Goal: Diagnostic test results will improve 12/27/2018 1520 by Youlanda Roys, RN Outcome: Completed/Met 12/27/2018 1303 by Youlanda Roys, RN Outcome: Progressing Goal: Respiratory complications will improve 12/27/2018 1520 by Youlanda Roys, RN Outcome: Completed/Met 12/27/2018 1303 by Youlanda Roys, RN Outcome: Progressing Goal: Cardiovascular complication will be avoided 12/27/2018 1520 by Youlanda Roys, RN Outcome: Completed/Met 12/27/2018 1303 by Youlanda Roys, RN Outcome: Progressing   Problem: Activity: Goal: Risk for activity intolerance will decrease 12/27/2018 1520 by Youlanda Roys, RN Outcome: Completed/Met 12/27/2018 1303 by Youlanda Roys, RN Outcome: Progressing   Problem: Nutrition: Goal: Adequate nutrition will be maintained 12/27/2018 1520 by Youlanda Roys, RN Outcome: Completed/Met 12/27/2018 1303 by Youlanda Roys, RN Outcome: Progressing   Problem: Coping: Goal: Level of anxiety will decrease 12/27/2018 1520 by Youlanda Roys, RN Outcome: Completed/Met 12/27/2018 1303 by Youlanda Roys, RN Outcome: Progressing   Problem: Elimination: Goal: Will not experience complications related to bowel motility 12/27/2018 1520 by Youlanda Roys, RN Outcome: Completed/Met 12/27/2018 1303 by Youlanda Roys, RN Outcome: Progressing Goal: Will not experience complications related to urinary retention 12/27/2018 1520 by Youlanda Roys, RN Outcome: Completed/Met 12/27/2018 1303 by Youlanda Roys, RN Outcome: Progressing   Problem: Pain Managment: Goal: General experience of comfort will improve 12/27/2018 1520 by Youlanda Roys, RN Outcome: Completed/Met 12/27/2018 1303 by Youlanda Roys, RN Outcome: Progressing   Problem: Safety: Goal: Ability to remain free from injury will improve 12/27/2018 1520 by Youlanda Roys, RN Outcome: Completed/Met 12/27/2018  1303 by Youlanda Roys, RN Outcome: Progressing   Problem: Skin Integrity: Goal: Risk for impaired skin integrity will decrease 12/27/2018 1520 by Youlanda Roys, RN Outcome: Completed/Met 12/27/2018 1303 by Youlanda Roys, RN Outcome: Progressing

## 2018-12-28 DIAGNOSIS — Z471 Aftercare following joint replacement surgery: Secondary | ICD-10-CM | POA: Diagnosis not present

## 2018-12-28 DIAGNOSIS — Z96651 Presence of right artificial knee joint: Secondary | ICD-10-CM | POA: Diagnosis not present

## 2018-12-28 DIAGNOSIS — M1991 Primary osteoarthritis, unspecified site: Secondary | ICD-10-CM | POA: Diagnosis not present

## 2019-01-01 ENCOUNTER — Other Ambulatory Visit: Payer: Self-pay | Admitting: Family Medicine

## 2019-01-01 MED ORDER — HYDROCHLOROTHIAZIDE 25 MG PO TABS: 25.0000 mg | ORAL_TABLET | Freq: Every day | ORAL | 1 refills | Status: DC

## 2019-01-01 NOTE — Addendum Note (Signed)
Addended by: Esau Grew on: 01/01/2019 04:10 PM   Modules accepted: Orders

## 2019-01-02 DIAGNOSIS — Z471 Aftercare following joint replacement surgery: Secondary | ICD-10-CM | POA: Diagnosis not present

## 2019-01-02 DIAGNOSIS — M1991 Primary osteoarthritis, unspecified site: Secondary | ICD-10-CM | POA: Diagnosis not present

## 2019-01-02 DIAGNOSIS — M1711 Unilateral primary osteoarthritis, right knee: Secondary | ICD-10-CM | POA: Diagnosis not present

## 2019-01-02 DIAGNOSIS — Z96651 Presence of right artificial knee joint: Secondary | ICD-10-CM | POA: Diagnosis not present

## 2019-01-04 DIAGNOSIS — Z96651 Presence of right artificial knee joint: Secondary | ICD-10-CM | POA: Diagnosis not present

## 2019-01-04 DIAGNOSIS — M1711 Unilateral primary osteoarthritis, right knee: Secondary | ICD-10-CM | POA: Diagnosis not present

## 2019-01-07 DIAGNOSIS — M1711 Unilateral primary osteoarthritis, right knee: Secondary | ICD-10-CM | POA: Diagnosis not present

## 2019-01-07 DIAGNOSIS — Z96651 Presence of right artificial knee joint: Secondary | ICD-10-CM | POA: Diagnosis not present

## 2019-01-10 DIAGNOSIS — M1711 Unilateral primary osteoarthritis, right knee: Secondary | ICD-10-CM | POA: Diagnosis not present

## 2019-01-10 DIAGNOSIS — Z96651 Presence of right artificial knee joint: Secondary | ICD-10-CM | POA: Diagnosis not present

## 2019-01-15 DIAGNOSIS — M1711 Unilateral primary osteoarthritis, right knee: Secondary | ICD-10-CM | POA: Diagnosis not present

## 2019-01-15 DIAGNOSIS — Z96651 Presence of right artificial knee joint: Secondary | ICD-10-CM | POA: Diagnosis not present

## 2019-01-17 DIAGNOSIS — Z96651 Presence of right artificial knee joint: Secondary | ICD-10-CM | POA: Diagnosis not present

## 2019-01-17 DIAGNOSIS — M1711 Unilateral primary osteoarthritis, right knee: Secondary | ICD-10-CM | POA: Diagnosis not present

## 2019-01-22 DIAGNOSIS — Z96651 Presence of right artificial knee joint: Secondary | ICD-10-CM | POA: Diagnosis not present

## 2019-01-22 DIAGNOSIS — M1711 Unilateral primary osteoarthritis, right knee: Secondary | ICD-10-CM | POA: Diagnosis not present

## 2019-01-25 DIAGNOSIS — M1711 Unilateral primary osteoarthritis, right knee: Secondary | ICD-10-CM | POA: Diagnosis not present

## 2019-01-25 DIAGNOSIS — Z96651 Presence of right artificial knee joint: Secondary | ICD-10-CM | POA: Diagnosis not present

## 2019-01-29 DIAGNOSIS — Z96651 Presence of right artificial knee joint: Secondary | ICD-10-CM | POA: Diagnosis not present

## 2019-01-29 DIAGNOSIS — M1711 Unilateral primary osteoarthritis, right knee: Secondary | ICD-10-CM | POA: Diagnosis not present

## 2019-01-31 DIAGNOSIS — Z96651 Presence of right artificial knee joint: Secondary | ICD-10-CM | POA: Diagnosis not present

## 2019-01-31 DIAGNOSIS — M1711 Unilateral primary osteoarthritis, right knee: Secondary | ICD-10-CM | POA: Diagnosis not present

## 2019-02-05 DIAGNOSIS — M1711 Unilateral primary osteoarthritis, right knee: Secondary | ICD-10-CM | POA: Diagnosis not present

## 2019-02-05 DIAGNOSIS — Z96651 Presence of right artificial knee joint: Secondary | ICD-10-CM | POA: Diagnosis not present

## 2019-02-07 DIAGNOSIS — M1711 Unilateral primary osteoarthritis, right knee: Secondary | ICD-10-CM | POA: Diagnosis not present

## 2019-02-07 DIAGNOSIS — Z96651 Presence of right artificial knee joint: Secondary | ICD-10-CM | POA: Diagnosis not present

## 2019-02-11 DIAGNOSIS — Z96651 Presence of right artificial knee joint: Secondary | ICD-10-CM | POA: Diagnosis not present

## 2019-02-11 DIAGNOSIS — M1711 Unilateral primary osteoarthritis, right knee: Secondary | ICD-10-CM | POA: Diagnosis not present

## 2019-02-14 DIAGNOSIS — M1711 Unilateral primary osteoarthritis, right knee: Secondary | ICD-10-CM | POA: Diagnosis not present

## 2019-02-14 DIAGNOSIS — Z96651 Presence of right artificial knee joint: Secondary | ICD-10-CM | POA: Diagnosis not present

## 2019-03-25 ENCOUNTER — Ambulatory Visit (HOSPITAL_COMMUNITY)
Admission: RE | Admit: 2019-03-25 | Discharge: 2019-03-25 | Disposition: A | Discharge status: Home / Self Care | Payer: 59 | Source: Ambulatory Visit | Attending: Orthopaedic Surgery | Admitting: Orthopaedic Surgery

## 2019-03-25 ENCOUNTER — Other Ambulatory Visit (HOSPITAL_COMMUNITY): Payer: Self-pay | Admitting: Orthopaedic Surgery

## 2019-03-25 ENCOUNTER — Other Ambulatory Visit: Payer: Self-pay

## 2019-03-25 DIAGNOSIS — M79604 Pain in right leg: Secondary | ICD-10-CM | POA: Diagnosis present

## 2019-03-25 DIAGNOSIS — M1711 Unilateral primary osteoarthritis, right knee: Secondary | ICD-10-CM | POA: Diagnosis not present

## 2019-03-25 DIAGNOSIS — M7989 Other specified soft tissue disorders: Secondary | ICD-10-CM

## 2019-03-25 NOTE — Progress Notes (Signed)
VASCULAR LAB PRELIMINARY  PRELIMINARY  PRELIMINARY  PRELIMINARY  Right lower extremity venous duplex completed.    Preliminary report:  See CV proc for preliminary results.  Gave report to Manual Meier, Cyndi Montejano, RVT 03/25/2019, 12:08 PM

## 2019-05-17 ENCOUNTER — Telehealth: Payer: Self-pay | Admitting: Student in an Organized Health Care Education/Training Program

## 2019-05-17 NOTE — Telephone Encounter (Addendum)
Patient calls on after-hours line for Rx refill. She is on [place] on vacation and forgot to bring her medications with her. She is particularly concerned about not taking her xarelto. She is otherwise feeling well and recovering from surgery.  I called in her prescription for xarelto 20mg  #30, 0 refills take 1 daily And HCTZ 25mg  #30, 0 refills take 1 daily  [facility]  [address] [place], [place]

## 2019-06-07 NOTE — Telephone Encounter (Signed)
Closing encounter

## 2019-06-10 ENCOUNTER — Other Ambulatory Visit: Payer: Self-pay

## 2019-06-11 MED ORDER — RIVAROXABAN 20 MG PO TABS: ORAL_TABLET | ORAL | 2 refills | Status: DC

## 2019-06-11 MED ORDER — HYDROCHLOROTHIAZIDE 25 MG PO TABS: 25.0000 mg | ORAL_TABLET | Freq: Every day | ORAL | 1 refills | Status: DC

## 2019-07-15 ENCOUNTER — Telehealth: Payer: Self-pay

## 2019-07-15 MED ORDER — RIVAROXABAN 20 MG PO TABS: ORAL_TABLET | ORAL | 2 refills | Status: DC

## 2019-07-15 MED ORDER — HYDROCHLOROTHIAZIDE 25 MG PO TABS: 25.0000 mg | ORAL_TABLET | Freq: Every day | ORAL | 1 refills | Status: DC

## 2019-07-15 NOTE — Telephone Encounter (Signed)
Refills sent to patient's [facility]. Thank you!

## 2019-07-15 NOTE — Telephone Encounter (Signed)
Patient calls nurse line stating she has not been able to get her refills for Xarelto and hydrochlorothiazide. I informed patient per our records these [facility] [place]. Patient stated she is no longer there and that was a one time thing when she was on vacation. Please send to her local pharmacy.

## 2019-08-23 ENCOUNTER — Other Ambulatory Visit: Payer: Self-pay | Admitting: Orthopaedic Surgery

## 2019-08-23 ENCOUNTER — Encounter: Payer: Self-pay | Admitting: Family Medicine

## 2019-08-23 ENCOUNTER — Ambulatory Visit (INDEPENDENT_AMBULATORY_CARE_PROVIDER_SITE_OTHER): Payer: BC Managed Care – PPO | Admitting: Family Medicine

## 2019-08-23 ENCOUNTER — Other Ambulatory Visit: Payer: Self-pay

## 2019-08-23 DIAGNOSIS — M25559 Pain in unspecified hip: Secondary | ICD-10-CM | POA: Diagnosis not present

## 2019-08-23 NOTE — Patient Instructions (Addendum)
It was great to meet you, I'm sorry it was under these circumstances.  Our plans for today:  -We talked about your hip pain and applying for disability - The website we discussed for the social security disability application is:  [email or web address] - After you fill out and application they will set you up with a disability appointment.   Take care and seek immediate care sooner if you develop any concerns.   Dr. [facility]

## 2019-08-23 NOTE — Assessment & Plan Note (Signed)
Patient with left hip pain, currently followed by orthopedics with plan for left hip partial or full replacement in a few months. Plan: -Discussed conservative measures to help with hip pain, including ice, heat, physical therapy. -Informed patient how to apply for disability insurance the Social Security website -Explained to patient that I am happy to try to help her in any way that I can during this process. -Patient plans to follow back up after her surgery

## 2019-08-23 NOTE — Progress Notes (Signed)
Ambulatory pulseOX 91%-95%

## 2019-08-23 NOTE — Progress Notes (Signed)
Subjective:    Patient ID: Michele Morrison, female    DOB: 05/15/1956, 63 y.o.   MRN: [id number]   CC: Discuss how to apply for disability due to left hip pain  HPI:  Michele Morrison is a very pleasant 63 year old female that presented today to discuss how to go about applying for disability.  She states that she had previously gone to her orthopedic surgeon to ask him to help her apply for disability due to her left hip pain and he recommended her follow-up with her PCP for more information.  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  This pain is primarily localized to the lateral and posterior aspect of her hip and is worsened with standing up, prolonged standing, and general walking.  She states that it typically does not bother her when she is seated, but if she has pressure on her left side it can also hurt.  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  She had also spoken with an orthopedic surgeon who plan to take her for either a partial or full hip replacement near the end of the year.  She states that even though she has this intervention coming up she feels that she needs to apply for disability as standing at her job is so painful.  She states she has tried some acetaminophen to help her on bad days when she has to stand for prolonged periods of time and this is worked well.  She has been taking 500 mg of acetaminophen as needed for the past month.  She states that she does not do this every day, but every once in a while.  Unfortunately, just a few minutes prior to her appointment she got a call that she had lost her job.  Patient stated that though she was very upset at this and was not sure if there was even a reason to pursue applying for disability at this point she still wanted to keep her appointment. She states that because  of just losing her job she did not want health maintenance items that were recommended, such as referral for mammogram, Pap smear, or redrawing blood lipids.  I did explain to the patient that I recommend her follow back up at a later time to discuss these further.   ROS: pertinent noted in the HPI   Pertinent PMH, PSH, FH, SoHx: Right knee arthroplasty 12/25/2018.  Smoking status -non-smoker  Objective:  BP (!) 144/76    Pulse 80    Ht 5\' 6"  (1.676 m)    Wt 238 lb 8 oz (108.2 kg)    SpO2 91%    BMI 38.49 kg/m   Patient's pulse ox was originally 88% when she first arrived, likely due to her being upset after just receiving news of losing her job.  The patient's pulse ox was rechecked and found to be 91%.  The patient was also ambulated with pulse ox with readings ranging from 91 to 95% during that time.  Patient did not feel short of breath during her encounter.  Vitals and nursing note reviewed  General: Appearing very upset Cardiac: RRR, no murmurs. Respiratory: CTAB, no wheezing or rhonchi MSK: Strength 5/5 in bilateral knee extension, strength 5/5 bilateral hip flexion.  Pain noted to left hip with  internal rotation and passive flexion, no pain noted to right hip during these movements.      Assessment & Plan:    Hip pain Patient with left hip pain, currently followed by orthopedics with plan for left hip partial or full replacement in a few months. Plan: -Discussed conservative measures to help with hip pain, including ice, heat, physical therapy. -Informed patient how to apply for disability insurance the Social Security website -Explained to patient that I am happy to try to help her in any way that I can during this process. -Patient plans to follow back up after her surgery    Lurline Del, [place] PGY-1

## 2019-09-02 ENCOUNTER — Other Ambulatory Visit: Payer: Self-pay

## 2019-09-02 ENCOUNTER — Encounter: Payer: Self-pay | Admitting: Family Medicine

## 2019-09-02 ENCOUNTER — Ambulatory Visit: Payer: BC Managed Care – PPO | Admitting: Family Medicine

## 2019-09-02 VITALS — BP 132/62 | HR 75 | Ht 66.0 in | Wt 239.2 lb

## 2019-09-02 DIAGNOSIS — M1612 Unilateral primary osteoarthritis, left hip: Secondary | ICD-10-CM

## 2019-09-02 DIAGNOSIS — Z01810 Encounter for preprocedural cardiovascular examination: Secondary | ICD-10-CM

## 2019-09-02 DIAGNOSIS — Z01818 Encounter for other preprocedural examination: Secondary | ICD-10-CM

## 2019-09-02 DIAGNOSIS — R7309 Other abnormal glucose: Secondary | ICD-10-CM | POA: Diagnosis not present

## 2019-09-02 HISTORY — DX: Encounter for preprocedural cardiovascular examination: Z01.810

## 2019-09-02 LAB — POCT GLYCOSYLATED HEMOGLOBIN (HGB A1C): Hemoglobin A1C: 5.7 % — AB (ref 4.0–5.6)

## 2019-09-02 NOTE — Assessment & Plan Note (Addendum)
Patient's ability to ambulate and exert herself is limited by left hip pain currently.  She is able to walk several blocks without dyspnea or chest pain.  This is slow due to her pain.  Her blood pressure is well controlled.  Will obtain hemoglobin 123456 metabolic panel today.  EKG shows some signs of left ventricular hypertrophy which is unchanged from prior.  She denies any symptoms.  She is at increased risk of venous thromboembolic event in the perioperative period which I discussed with her.  She is on rivaroxaban currently.  Recommend discontinuing 48 hours prior to surgery she may restart 12 to 24 hours after surgery pending recommendations from her orthopedic surgeon. She is optimized from a cardiac and medical standpoint at this time, pending further lab evaluation. Will finalize recommendations pending results of labs.

## 2019-09-02 NOTE — Patient Instructions (Signed)
   It was wonderful to see you today.  Thank you for choosing [facility].   Please call [phone] with any questions about today's appointment.  Please be sure to schedule follow up at the front  desk before you leave today.   Dorris Singh, MD  Family Medicine    I will call you with results of your blood work   Good luck with your surgery--- you will do great!    Stop Xeralto 48 hours (2 days prior to surgery) Take your dose on 10/24 Do not take your dose on 10/25, 10/26 or the day of the surgery  I will call you if my recommendations change based upon your blood work

## 2019-09-02 NOTE — Assessment & Plan Note (Deleted)
TCAs better than opioids, placebo for post-herpetic neuralgia  Published: 2002-02-25  Clinical Question Which is more effective for the treatment of postherpetic neuralgia: opioids or tricylcic antidepressants?  Bottom line Opioids and TCAs provide similar pain relief for patients with postherpetic neuralgia, but TCAs are generally better tolerated. (LOE = 1b)  Reference Raja SN, Haythornthwaite JA, Pappagallo M, et al. Opioids versus antidepressants in postherpetic neuralgia: a randomized, placebo-controlled trial. Neurology 2002; [id number]. [id number]  Study design: Cross-over trial (randomized)  Setting: Outpatient (any)  Synopsis Adults with neuropathic pain for at least 3 months after lesions had resolved were recruited through physician referral and advertisements. They had to have pain of at least 4 on a scale of 10 to be included. Of 103 patients who were screened, 76 were randomized and 71 started the study, but only 44 completed all 3 study periods. This was a crossover trial with patients receiving an opioid, a tricyclic antidepressant (TCA), or a placebo for each of 3 8-week study periods, with a 1-week washout period between phases. The order in which they received the drugs was randomly assigned using concealed allocation. Outcomes were assessed twice a week via telephone, and at a clinic visit at the end of each study period. The opioid was sustained release morphine (titrated to a maximum of 240 mg per day), and was compared with nortriptyline (titrated up to 160 mg per day) or placebo in 2 or 3 divided doses per day. Patients who did not tolerate morphine could be switched to methadone, and those who didn't tolerate nortriptyline could be switched to desipramine. Patients who took at least 1 dose of a medication and gave a rating of pain were included for that study period. The primary outcomes were pain intensity (0 to 10), pain relief (0% to 100%), and cognitive function  (using the Wechsler Adult Intelligence Scale and other validated measures). The mean daily doses were 91 mg for sustained-release morphine and 89 mg for nortriptyline. Opioids and TCAs were both more effective at reducing pain intensity than placebo (1.9 vs 1.4 vs 0.2 points on the 10-point scale; P < .01). This is a clinically meaningful difference, but barely. The percentage pain relief was also similar between opioids and TCAs, and both were again better than placebo (38% vs 32% vs 11%; P < .001). TCAs slightly reduced some cognitive measures; opioids and placebo did not. Significantly more patients preferred opioids to TCAs (54% vs 30%). Constipation, nausea, and drowsiness were common in the opioid group, and dizziness iwas common in the TCA group. Most important, patients voted with their feet in the opioid group: they were more likely to drop out during or after the opioid phase (n = 20) than during the TCA (n = 6) or placebo (n=1) phases.  Armanda Magic, MD, MS [employer] [place], [place]

## 2019-09-02 NOTE — Progress Notes (Addendum)
Patient Name: Michele Morrison Date of Birth: Jul 14, 1956 Date of Visit: 09/02/19 PCP: Lurline Del, DO  Chief Complaint: pre operative evaluation   Subjective: Michele Morrison is a pleasant 63 y.o. 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   The patient reports overall she is doing well.  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  She had for suicide about this but feels like this is overall been a positive change.  She denies any thoughts of sadness or passive thoughts of hurting herself or others.  The patient has a history of 2 knee arthroplasties the most recent which was in February 2020.  She reports she took about 3 months to rehab and did quite well.  She has had persistent pain and limitations to her functional abilities due to left hip pain.  She will be undergoing a left hip arthroplasty with her orthopedic surgeon on 27 October.  Medical history is notable for history of venous thromboembolic events.  Her first was in the 90s when she was taking oral estrogen.  Her second was after a 13-hour plane flight.  She is on long-term anticoagulation.  She also has a history of hypertension which is well controlled.  History also significant for obesity.  PSH Two prior knee arthroplasties  Left shoulder surgery   Review of systems: Chest pain with exertion? No Dyspnea on exertion? No Can you walk 2 blocks without dyspnea or chest pain? Yes Can you walk up a flight of stairs without chest pain or dyspnea? Yes Any history of easy bruising or bleeding? Yes related to anticoagulation, no melena, hematochezia, or vaginal bleeding, bruises easily on extensor surfaces Any family history of bleeding diathesis? No Any personal or family of difficulty with anesthesia? No Any history of sleep apnea? No  Medical History: Cardiac conditions? Hypertension History  of VTE? Yes-  Has a history of two prior VTE In late 1990s when on estrogen therapy  In 1996 after prolonged flight She is on chronic anticoagulation Has no history of postoperative  History of adverse surgical outcome? No   Diabetes? No  Obesity? Yes- BMI 38  OSA? No     ROS: Per HPI.   I have reviewed the patient's medical, surgical, family, and social history as appropriate.  Vitals:   09/02/19 0845  BP: 132/62  Pulse: 75  SpO2: 95%  HEENT: Sclera anicteric. Dentition is moderate. Appears well hydrated. Neck: Supple Cardiac: Regular rate and rhythm. Normal S1/S2. No murmurs, rubs, or gallops appreciated. Lungs: Clear bilaterally to ascultation.  Abdomen: Normoactive bowel sounds. No tenderness to deep or light palpation. No rebound or guarding.  Extremities: Warm, well perfused without edema.  Skin: Warm, dry Psych: Pleasant and appropriate  EKG shows sinus bradycardia with a single PAC, LVH similar to prior  Preop cardiovascular exam Patient's ability to ambulate and exert herself is limited by left hip pain currently.  She is able to walk several blocks without dyspnea or chest pain.  This is slow due to her pain.  Her blood pressure is well controlled.  Will obtain hemoglobin 123456 metabolic panel today.  EKG shows some signs of left ventricular hypertrophy which is unchanged from prior.  She denies any symptoms.  She is at increased risk of venous thromboembolic event in the perioperative period which I discussed with her.  She is on rivaroxaban currently.  Recommend discontinuing 48  hours prior to surgery she may restart 12 to 24 hours after surgery pending recommendations from her orthopedic surgeon. She is optimized from a cardiac and medical standpoint at this time, pending further lab evaluation. Will finalize recommendations pending results of labs.    HCM Discussed with patient that she is due for Pap smear mammogram and several other healthcare maintenance items.  She  will return for a visit with her primary care physician. She will follow up after the new year after she is healed from her hip arthroplasty.  Addendum: Reviewed labs. A1C is prediabetic. She can stop Xeralto 48 hours prior to surgery. This can be restarted at surgeon's preference- 12-24 hours after.   Mild hypercalcemia noted, likely due to HCTZ. Will discuss with PCP. As outpatient, will stop HCTZ and retest.  Dorris Singh, MD  [facility] Medicine Teaching Service

## 2019-09-03 LAB — BASIC METABOLIC PANEL
BUN/Creatinine Ratio: 21 (ref 12–28)
BUN: 15 mg/dL (ref 8–27)
CO2: 26 mmol/L (ref 20–29)
Calcium: 11 mg/dL — ABNORMAL HIGH (ref 8.7–10.3)
Chloride: 100 mmol/L (ref 96–106)
Creatinine, Ser: 0.7 mg/dL (ref 0.57–1.00)
GFR calc Af Amer: 107 mL/min/{1.73_m2} (ref >59)
GFR calc non Af Amer: 93 mL/min/{1.73_m2} (ref >59)
Glucose: 101 mg/dL — ABNORMAL HIGH (ref 65–99)
Potassium: 4 mmol/L (ref 3.5–5.2)
Sodium: 141 mmol/L (ref 134–144)

## 2019-09-03 LAB — CBC
Hematocrit: 43.8 % (ref 34.0–46.6)
Hemoglobin: 14.6 g/dL (ref 11.1–15.9)
MCH: 29.9 pg (ref 26.6–33.0)
MCHC: 33.3 g/dL (ref 31.5–35.7)
MCV: 90 fL (ref 79–97)
Platelets: 235 10*3/uL (ref 150–450)
RBC: 4.89 x10E6/uL (ref 3.77–5.28)
RDW: 12.3 % (ref 11.7–15.4)
WBC: 5.9 10*3/uL (ref 3.4–10.8)

## 2019-09-04 NOTE — Patient Instructions (Addendum)
DUE TO COVID-19 ONLY ONE VISITOR IS ALLOWED TO COME WITH YOU AND STAY IN THE WAITING ROOM ONLY DURING PRE OP AND PROCEDURE DAY OF SURGERY. THE 1 VISITOR MAY VISIT WITH YOU AFTER SURGERY IN YOUR PRIVATE ROOM DURING VISITING HOURS ONLY!  YOU NEED TO HAVE A COVID 19 TEST ON_10/23______ @___2 :10 pm____, THIS TEST MUST BE DONE BEFORE SURGERY, COME  [address], [place] , [address].  ([place]) ONCE YOUR COVID TEST IS COMPLETED, PLEASE BEGIN THE QUARANTINE INSTRUCTIONS AS OUTLINED IN YOUR HANDOUT.                Michele Morrison   Your procedure is scheduled on: 09/10/19   Report to [facility] Main  Entrance   Report to short stay 5:30 AM     Call this number if you have problems the morning of surgery [name], NO CHEWING GUM [name].    Do not eat food After Midnight  . YOU MAY HAVE CLEAR LIQUIDS FROM MIDNIGHT UNTIL 4:30AM.   At 4:30AM Please finish the prescribed Pre-Surgery Gatorade drink.   Nothing by mouth after you finish the Gatorade drink !  Take these medicines the morning of surgery with A SIP OF WATER: none  DO NOT TAKE ANY DIABETIC MEDICATIONS DAY OF YOUR SURGERY                               You may not have any metal on your body including hair pins and              piercings              Do not wear jewelry, make-up, lotions, powders or perfumes, deodorant             Do not wear nail polish on your fingernails.  Do not shave  48 hours prior to surgery.                 Do not bring valuables to the hospital. [place].  Contacts, dentures or bridgework may not be worn into surgery.      Patients discharged the day of surgery will not be allowed to drive home.  IF YOU ARE HAVING SURGERY AND GOING HOME THE SAME DAY, YOU MUST HAVE AN ADULT TO DRIVE YOU HOME AND BE WITH YOU FOR 24 HOURS.  YOU MAY GO HOME BY  TAXI OR UBER OR ORTHERWISE, BUT AN ADULT MUST ACCOMPANY YOU HOME AND STAY WITH YOU FOR 24 HOURS.  Name and phone number of your driver:  Special Instructions: N/A              Please read over the following fact sheets you were given: _____________________________________________________________________             [facility] - Preparing for Surgery  Before surgery, you can play an important role.   Because skin is not sterile, your skin needs to be as free of germs as possible.   You can reduce the number of germs on your skin by washing with CHG (chlorahexidine gluconate) soap before surgery.   CHG is an antiseptic cleaner which kills germs and bonds with the skin to continue killing germs  even after washing. Please DO NOT use if you have an allergy to CHG or antibacterial soaps.   If your skin becomes reddened/irritated stop using the CHG and inform your nurse when you arrive at Short Stay. Do not shave (including legs and underarms) for at least 48 hours prior to the first CHG shower.  Please follow these instructions carefully:  1.  Shower with CHG Soap the night before surgery and the  morning of Surgery.  2.  If you choose to wash your hair, wash your hair first as usual with your  normal  shampoo.  3.  After you shampoo, rinse your hair and body thoroughly to remove the  shampoo.                                        4.  Use CHG as you would any other liquid soap.  You can apply chg directly  to the skin and wash                       Gently with a scrungie or clean washcloth.  5.  Apply the CHG Soap to your body ONLY FROM THE NECK DOWN.   Do not use on face/ open                           Wound or open sores. Avoid contact with eyes, ears mouth and genitals (private parts).                       Wash face,  Genitals (private parts) with your normal soap.             6.  Wash thoroughly, paying special attention to the area where your surgery  will be performed.  7.   Thoroughly rinse your body with warm water from the neck down.  8.  DO NOT shower/wash with your normal soap after using and rinsing off  the CHG Soap.                9.  Pat yourself dry with a clean towel.            10.  Wear clean pajamas.            11.  Place clean sheets on your bed the night of your first shower and do not  sleep with pets. Day of Surgery : Do not apply any lotions/deodorants the morning of surgery.  Please wear clean clothes to the hospital/surgery center.  FAILURE TO FOLLOW THESE INSTRUCTIONS MAY RESULT IN THE CANCELLATION OF YOUR SURGERY PATIENT SIGNATURE_________________________________  NURSE SIGNATURE__________________________________  ________________________________________________________________________   Michele Morrison  An incentive spirometer is a tool that can help keep your lungs clear and active. This tool measures how well you are filling your lungs with each breath. Taking long deep breaths may help reverse or decrease the chance of developing breathing (pulmonary) problems (especially infection) following:  A long period of time when you are unable to move or be active. BEFORE THE PROCEDURE   If the spirometer includes an indicator to show your best effort, your nurse or respiratory therapist will set it to a desired goal.  If possible, sit up straight or lean slightly forward. Try not to slouch.  Hold the incentive spirometer in an upright position.  INSTRUCTIONS FOR USE  1. Sit on the edge of your bed if possible, or sit up as far as you can in bed or on a chair. 2. Hold the incentive spirometer in an upright position. 3. Breathe out normally. 4. Place the mouthpiece in your mouth and seal your lips tightly around it. 5. Breathe in slowly and as deeply as possible, raising the piston or the ball toward the top of the column. 6. Hold your breath for 3-5 seconds or for as long as possible. Allow the piston or ball to fall to the bottom  of the column. 7. Remove the mouthpiece from your mouth and breathe out normally. 8. Rest for a few seconds and repeat Steps 1 through 7 at least 10 times every 1-2 hours when you are awake. Take your time and take a few normal breaths between deep breaths. 9. The spirometer may include an indicator to show your best effort. Use the indicator as a goal to work toward during each repetition. 10. After each set of 10 deep breaths, practice coughing to be sure your lungs are clear. If you have an incision (the cut made at the time of surgery), support your incision when coughing by placing a pillow or rolled up towels firmly against it. Once you are able to get out of bed, walk around indoors and cough well. You may stop using the incentive spirometer when instructed by your caregiver.  RISKS AND COMPLICATIONS  Take your time so you do not get dizzy or light-headed.  If you are in pain, you may need to take or ask for pain medication before doing incentive spirometry. It is harder to take a deep breath if you are having pain. AFTER USE  Rest and breathe slowly and easily.  It can be helpful to keep track of a log of your progress. Your caregiver can provide you with a simple table to help with this. If you are using the spirometer at home, follow these instructions: [place] IF:   You are having difficultly using the spirometer.  You have trouble using the spirometer as often as instructed.  Your pain medication is not giving enough relief while using the spirometer.  You develop fever of 100.5 F (38.1 C) or higher. SEEK IMMEDIATE MEDICAL CARE IF:   You cough up bloody sputum that had not been present before.  You develop fever of 102 F (38.9 C) or greater.  You develop worsening pain at or near the incision site. MAKE SURE YOU:   Understand these instructions.  Will watch your condition.  Will get help right away if you are not doing well or get worse. Document  Released: 03/13/2007 Document Revised: 01/23/2012 Document Reviewed: 05/14/2007 [facility] Patient Information 2014 [place], [place].   ________________________________________________________________________

## 2019-09-04 NOTE — H&P (Signed)
TOTAL HIP ADMISSION H&P  Patient is admitted for left total hip arthroplasty.  Subjective:  Chief Complaint: left hip pain  HPI: Michele Morrison, 63 y.o. female, has a history of pain and functional disability in the left hip(s) due to arthritis and patient has failed non-surgical conservative treatments for greater than 12 weeks to include NSAID's and/or analgesics, flexibility and strengthening excercises, supervised PT with diminished ADL's post treatment, use of assistive devices, weight reduction as appropriate and activity modification.  Onset of symptoms was gradual starting 5 years ago with gradually worsening course since that time.The patient noted no past surgery on the left hip(s).  Patient currently rates pain in the left hip at 10 out of 10 with activity. Patient has night pain, worsening of pain with activity and weight bearing, trendelenberg gait, pain that interfers with activities of daily living and crepitus. Patient has evidence of subchondral cysts, subchondral sclerosis, periarticular osteophytes and joint space narrowing by imaging studies. This condition presents safety issues increasing the risk of falls. There is no current active infection.  Patient Active Problem List   Diagnosis Date Noted  . Preop cardiovascular exam 09/02/2019  . Hip pain 08/23/2019  . Primary localized osteoarthritis of right knee 12/25/2018  . Primary osteoarthritis of right knee 12/25/2018  . Vaginal yeast infection 12/11/2018  . Acute bronchitis 04/25/2017  . Preventative health care 12/21/2015  . Ulcer of ankle ([place]) 05/27/2014  . Other malaise and fatigue 01/01/2014  . Long term (current) use of anticoagulants 12/25/2010  . ESSENTIAL HYPERTENSION, BENIGN 05/03/2010  . OBESITY 02/07/2008   Past Medical History:  Diagnosis Date  . Arthritis    thumbs,spine  . 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 Diverticulosis   . H/O blood clots   . History of kidney stones   . HTN (hypertension)    . Hyperlipidemia   . Kidney stones   . Pre-diabetes     Past Surgical History:  Procedure Laterality Date  . COLONOSCOPY    . ROTATOR CUFF REPAIR Right   . TOTAL KNEE ARTHROPLASTY Left 2012  . TOTAL KNEE ARTHROPLASTY Right 12/25/2018   Procedure: TOTAL KNEE ARTHROPLASTY;  Surgeon: Melrose Nakayama, MD;  Location: [facility];  Service: Orthopedics;  Laterality: Right;    No current facility-administered medications for this encounter.    Current Outpatient Medications  Medication Sig Dispense Refill Last Dose  . acetaminophen (TYLENOL) 500 MG tablet Take 500 mg by mouth every 6 (six) hours as needed for moderate pain.     . hydrochlorothiazide (HYDRODIURIL) 25 MG tablet Take 1 tablet (25 mg total) by mouth daily. (Patient taking differently: Take 25 mg by mouth every evening. ) 90 tablet 1   . meclizine (ANTIVERT) 25 MG tablet Take 25 mg by mouth every 8 (eight) hours as needed for dizziness.     . rivaroxaban (XARELTO) 20 MG TABS tablet TAKE 1 TABLET BY MOUTH EVERY DAY WITH SUPPER (Patient taking differently: Take 20 mg by mouth every evening. ) 90 tablet 2    Allergies  Allergen Reactions  . Latex Itching, Rash and Other (See Comments)    Severe Blisters  . Cephalexin Rash  . Vancomycin Rash    Received during knee surgery 12/25/18, developed flushed skin and rash, resolved with administration of benadryl  . Neosporin [Bacitracin-Polymyxin B]     Blisters   . Penicillins Other (See Comments)    Did it involve swelling of the face/tongue/throat, SOB, or low BP? Unknown-Pt NEVER taken  Did it involve sudden or severe rash/hives, skin peeling, or any reaction on the inside of your mouth or nose? Unknown pt NEVER taken  Did you need to seek medical attention at a hospital or doctor's office? Unknown-pt NEVER taken  When did it last happen? pt NEVER taken this medication If all above answers are "NO", may proceed with cephalosporin use. Mother had a reaction while pregnant with pt.      Social History   Tobacco Use  . Smoking status: Never Smoker  . Smokeless tobacco: Never Used  Substance Use Topics  . Alcohol use: Yes    Alcohol/week: 0.0 standard drinks    Comment: rarely    Family History  Problem Relation Age of Onset  . Colon cancer Mother 14  . Coronary artery disease Mother   . Heart disease Mother   . Cancer Father        Non-Hodgkin's lymphoma  . Coronary artery disease Father   . Heart disease Father   . Stomach cancer Paternal Grandfather        pt unsure  . Esophageal cancer Neg Hx   . Rectal cancer Neg Hx      Review of Systems  Musculoskeletal: Positive for joint pain.       Left hip  All other systems reviewed and are negative.   Objective:  Physical Exam  Constitutional: She is oriented to person, place, and time. She appears well-developed and well-nourished.  HENT:  Head: Normocephalic and atraumatic.  Eyes: Pupils are equal, round, and reactive to light.  Neck: Normal range of motion.  Cardiovascular: Normal rate and regular rhythm.  Respiratory: Effort normal.  GI: Soft.  Musculoskeletal:     Comments: Examination of the left hip shows significant pain with internal range of motion.  Leg lengths appear roughly equal.  She has 5 out of 5 strength normal sensation throughout both lower extremities.  She is neurovascularly intact distally.    Neurological: She is alert and oriented to person, place, and time.  Skin: Skin is warm and dry.  Psychiatric: She has a normal mood and affect. Her behavior is normal. Judgment and thought content normal.    Vital signs in last 24 hours:    Labs:   Estimated body mass index is 38.62 kg/m as calculated from the following:   Height as of 09/02/19: 5\' 6"  (1.676 m).   Weight as of 09/02/19: 108.5 kg.   Imaging Review Plain radiographs demonstrate severe degenerative joint disease of the left hip(s). The bone quality appears to be good for age and reported activity  level.      Assessment/Plan:  End stage primary arthritis, left hip(s)  The patient history, physical examination, clinical judgement of the provider and imaging studies are consistent with end stage degenerative joint disease of the left hip(s) and total hip arthroplasty is deemed medically necessary. The treatment options including medical management, injection therapy, arthroscopy and arthroplasty were discussed at length. The risks and benefits of total hip arthroplasty were presented and reviewed. The risks due to aseptic loosening, infection, stiffness, dislocation/subluxation,  thromboembolic complications and other imponderables were discussed.  The patient acknowledged the explanation, agreed to proceed with the plan and consent was signed. Patient is being admitted for inpatient treatment for surgery, pain control, PT, OT, prophylactic antibiotics, VTE prophylaxis, progressive ambulation and ADL's and discharge planning.The patient is planning to be discharged home with home health services

## 2019-09-05 ENCOUNTER — Encounter (HOSPITAL_COMMUNITY): Payer: Self-pay

## 2019-09-05 ENCOUNTER — Other Ambulatory Visit: Payer: Self-pay

## 2019-09-05 ENCOUNTER — Encounter (HOSPITAL_COMMUNITY)
Admission: RE | Admit: 2019-09-05 | Discharge: 2019-09-05 | Disposition: A | Discharge status: Home / Self Care | Payer: BC Managed Care – PPO | Source: Ambulatory Visit | Attending: Orthopaedic Surgery | Admitting: Orthopaedic Surgery

## 2019-09-05 DIAGNOSIS — Z79899 Other long term (current) drug therapy: Secondary | ICD-10-CM | POA: Diagnosis not present

## 2019-09-05 DIAGNOSIS — Z01818 Encounter for other preprocedural examination: Secondary | ICD-10-CM | POA: Diagnosis not present

## 2019-09-05 DIAGNOSIS — E785 Hyperlipidemia, unspecified: Secondary | ICD-10-CM | POA: Diagnosis not present

## 2019-09-05 DIAGNOSIS — R7303 Prediabetes: Secondary | ICD-10-CM | POA: Diagnosis not present

## 2019-09-05 DIAGNOSIS — M1612 Unilateral primary osteoarthritis, left hip: Secondary | ICD-10-CM | POA: Insufficient documentation

## 2019-09-05 DIAGNOSIS — Z86718 Personal history of other venous thrombosis and embolism: Secondary | ICD-10-CM | POA: Diagnosis not present

## 2019-09-05 DIAGNOSIS — Z7901 Long term (current) use of anticoagulants: Secondary | ICD-10-CM | POA: Diagnosis not present

## 2019-09-05 DIAGNOSIS — Z96653 Presence of artificial knee joint, bilateral: Secondary | ICD-10-CM | POA: Diagnosis not present

## 2019-09-05 DIAGNOSIS — Z87442 Personal history of urinary calculi: Secondary | ICD-10-CM | POA: Diagnosis not present

## 2019-09-05 DIAGNOSIS — I10 Essential (primary) hypertension: Secondary | ICD-10-CM | POA: Insufficient documentation

## 2019-09-05 HISTORY — DX: Nausea with vomiting, unspecified: R11.2

## 2019-09-05 HISTORY — DX: Other specified postprocedural states: Z98.890

## 2019-09-05 LAB — CBC WITH DIFFERENTIAL/PLATELET
Abs Immature Granulocytes: 0.03 10*3/uL (ref 0.00–0.07)
Basophils Absolute: 0.1 10*3/uL (ref 0.0–0.1)
Basophils Relative: 1 %
Eosinophils Absolute: 0.2 10*3/uL (ref 0.0–0.5)
Eosinophils Relative: 2 %
HCT: 46.6 % — ABNORMAL HIGH (ref 36.0–46.0)
Hemoglobin: 14.7 g/dL (ref 12.0–15.0)
Immature Granulocytes: 0 %
Lymphocytes Relative: 24 %
Lymphs Abs: 1.8 10*3/uL (ref 0.7–4.0)
MCH: 30.3 pg (ref 26.0–34.0)
MCHC: 31.5 g/dL (ref 30.0–36.0)
MCV: 96.1 fL (ref 80.0–100.0)
Monocytes Absolute: 0.6 10*3/uL (ref 0.1–1.0)
Monocytes Relative: 8 %
Neutro Abs: 4.8 10*3/uL (ref 1.7–7.7)
Neutrophils Relative %: 65 %
Platelets: 215 10*3/uL (ref 150–400)
RBC: 4.85 MIL/uL (ref 3.87–5.11)
RDW: 12.8 % (ref 11.5–15.5)
WBC: 7.4 10*3/uL (ref 4.0–10.5)
nRBC: 0 % (ref 0.0–0.2)

## 2019-09-05 LAB — URINALYSIS, ROUTINE W REFLEX MICROSCOPIC
Bilirubin Urine: NEGATIVE
Glucose, UA: NEGATIVE mg/dL
Hgb urine dipstick: NEGATIVE
Ketones, ur: 5 mg/dL — AB
Leukocytes,Ua: NEGATIVE
Nitrite: NEGATIVE
Protein, ur: NEGATIVE mg/dL
Specific Gravity, Urine: 1.021 (ref 1.005–1.030)
pH: 5 (ref 5.0–8.0)

## 2019-09-05 LAB — ABO/RH: ABO/RH(D): A POS

## 2019-09-05 LAB — PROTIME-INR
INR: 1.3 — ABNORMAL HIGH (ref 0.8–1.2)
Prothrombin Time: 16.4 seconds — ABNORMAL HIGH (ref 11.4–15.2)

## 2019-09-05 LAB — SURGICAL PCR SCREEN
MRSA, PCR: NEGATIVE
Staphylococcus aureus: POSITIVE — AB

## 2019-09-05 LAB — APTT: aPTT: 46 seconds — ABNORMAL HIGH (ref 24–36)

## 2019-09-05 NOTE — Progress Notes (Signed)
PCP - Lurline Del DO Cardiologist - none  Chest x-ray - 12/17/18 EKG - 09/05/19 Stress Test - no ECHO - no Cardiac Cath - no  Sleep Study - no CPAP -   Fasting Blood Sugar - NA Checks Blood Sugar _____ times a day  Blood Thinner Instructions :xarelto Aspirin Instructions: Last Dose:09/06/19  Anesthesia review:   Patient denies shortness of breath, fever, cough and chest pain at PAT appointment yes  Patient verbalized understanding of instructions that were given to them at the PAT appointment. Patient was also instructed that they will need to review over the PAT instructions again at home before surgery. Yes. Pt reports that she had awareness during anesthesia and that her adductor canal block " didn't  Work". She woke up with pain and nauseated. She is very anxious about this up coming hip surgery.

## 2019-09-06 ENCOUNTER — Telehealth: Payer: Self-pay | Admitting: Family Medicine

## 2019-09-06 ENCOUNTER — Other Ambulatory Visit (HOSPITAL_COMMUNITY)
Admission: RE | Admit: 2019-09-06 | Discharge: 2019-09-06 | Disposition: A | Discharge status: Home / Self Care | Payer: BC Managed Care – PPO | Source: Ambulatory Visit | Attending: Orthopaedic Surgery | Admitting: Orthopaedic Surgery

## 2019-09-06 DIAGNOSIS — Z01812 Encounter for preprocedural laboratory examination: Secondary | ICD-10-CM | POA: Insufficient documentation

## 2019-09-06 DIAGNOSIS — Z20828 Contact with and (suspected) exposure to other viral communicable diseases: Secondary | ICD-10-CM | POA: Insufficient documentation

## 2019-09-06 NOTE — Telephone Encounter (Signed)
Wells Guiles from [facility] is calling to check on that status of pt's surgical clearance form being faxed back.   Pt surgery is 09/10/19.   Wells Guiles left a call back number if there are an questions are concerns. [phone].

## 2019-09-06 NOTE — Progress Notes (Signed)
Anesthesia Chart Review:   Case: [id number] Date/Time: 09/10/19 0715   Procedure: LEFT TOTAL HIP ARTHROPLASTY ANTERIOR APPROACH (Left Hip)   Anesthesia type: Spinal   Pre-op diagnosis: left hip degenerative joint disease   Location: [facility] ROOM 06 / [facility]   Surgeon: Melrose Nakayama, MD      DISCUSSION:  - Pt is a 63 year old female with hx HTN, prediabetes, DVT (takes xarelto)  - Pt reports she had aware of surgery during her R TKA last February and that her adductor canal block " didn't work". She woke up with pain and nausea. She is very anxious about this upcoming hip surgery.  - PT 16.3, PTT 46.  Will recheck day of surgery after pt has stopped xarelto. Last dose xarelto 09/06/19    VS: BP (!) 150/84   Pulse 77   Temp 37.2 C (Oral)   Resp 16   Ht 5\' 6"  (1.676 m)   Wt 107 kg   SpO2 96%   BMI 38.09 kg/m    PROVIDERS: - PCP is Lurline Del, DO   LABS:  - PT 16.3, PTT 46. Will recheck day of surgery.  I left voicemail for Dr. Jerald Kief scheduler.   (all labs ordered are listed, but only abnormal results are displayed)  Labs Reviewed  SURGICAL PCR SCREEN - Abnormal; Notable for the following components:      Result Value   Staphylococcus aureus POSITIVE (*)    All other components within normal limits  APTT - Abnormal; Notable for the following components:   aPTT 46 (*)    All other components within normal limits  CBC WITH DIFFERENTIAL/PLATELET - Abnormal; Notable for the following components:   HCT 46.6 (*)    All other components within normal limits  PROTIME-INR - Abnormal; Notable for the following components:   Prothrombin Time 16.4 (*)    INR 1.3 (*)    All other components within normal limits  URINALYSIS, ROUTINE W REFLEX MICROSCOPIC - Abnormal; Notable for the following components:   APPearance HAZY (*)    Ketones, ur 5 (*)    All other components within normal limits  TYPE AND SCREEN  ABO/RH     IMAGES:  CXR 12/17/18: No acute disease.   EKG  12/17/18: NSR. LVH with QRS widening    Past Medical History:  Diagnosis Date  . Arthritis    thumbs,spine  . 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 Complication of anesthesia 12/2018   awareness during surgery  . Diverticulosis   . H/O blood clots 1996   after a 14 hour plane flight  . History of kidney stones   . HTN (hypertension)   . Hyperlipidemia   . Kidney stones   . PONV (postoperative nausea and vomiting)   . Pre-diabetes     Past Surgical History:  Procedure Laterality Date  . COLONOSCOPY    . JOINT REPLACEMENT Bilateral   . ROTATOR CUFF REPAIR Right   . TOTAL KNEE ARTHROPLASTY Left 2012  . TOTAL KNEE ARTHROPLASTY Right 12/25/2018   Procedure: TOTAL KNEE ARTHROPLASTY;  Surgeon: Melrose Nakayama, MD;  Location: [facility];  Service: Orthopedics;  Laterality: Right;    MEDICATIONS: . acetaminophen (TYLENOL) 500 MG tablet  . hydrochlorothiazide (HYDRODIURIL) 25 MG tablet  . meclizine (ANTIVERT) 25 MG tablet  . rivaroxaban (XARELTO) 20 MG TABS tablet   No current facility-administered medications for this encounter.    Last dose xarelto  09/06/19   If labs acceptable day of surgery, I anticipate pt can proceed with surgery as scheduled.  Willeen Cass, FNP-BC [facility] Stay [facility]/Anesthesiology Phone: [phone] 09/06/2019 9:11 AM

## 2019-09-06 NOTE — Telephone Encounter (Signed)
Michele Morrison,   The patient saw another provider with [place] for this form since I had no appointments available. I believe this was last week.  Let me know if there's anyway I can help.  Swathi Dauphin

## 2019-09-06 NOTE — Telephone Encounter (Signed)
Will talk with Dr. Grandville Silos who saw patient for this appt. Jazmin Hartsell,CMA

## 2019-09-06 NOTE — Telephone Encounter (Signed)
Forms placed in to be faxed pile. Jazmin Hartsell,CMA

## 2019-09-06 NOTE — Telephone Encounter (Signed)
Called patient to review results. All questions answered. She is quarantining after COVID test.

## 2019-09-06 NOTE — Telephone Encounter (Signed)
Reviewed labs, form signed, completed, note addended. Please fax signed form, note, and EKG from 10/19.   Dr. Grandville Silos- please route labs to me.   Thank you, Dorris Singh, MD  [facility] Teaching [facility]

## 2019-09-06 NOTE — Anesthesia Preprocedure Evaluation (Addendum)
Anesthesia Evaluation   Patient awake    Reviewed: Allergy & Precautions, NPO status , Patient's Chart, lab work & pertinent test results  History of Anesthesia Complications (+) PONV and history of anesthetic complications  Airway Mallampati: II  TM Distance: >3 FB Neck ROM: Full    Dental no notable dental hx.    Pulmonary neg pulmonary ROS,    Pulmonary exam normal breath sounds clear to auscultation       Cardiovascular hypertension, + DVT  Normal cardiovascular exam Rhythm:Regular Rate:Normal  Hx DVT 1996 after long flight   Neuro/Psych negative neurological ROS  negative psych ROS   GI/Hepatic Neg liver ROS,   Endo/Other  Obesity BMI 38  Renal/GU Renal diseaseHx nephrolithiasis  negative genitourinary   Musculoskeletal  (+) Arthritis , Osteoarthritis,    Abdominal   Peds  Hematology negative hematology ROS (+)   Anesthesia Other Findings   Reproductive/Obstetrics negative OB ROS                                                             Anesthesia Evaluation  Patient identified by MRN, date of birth, ID band Patient awake    Reviewed: Allergy & Precautions, NPO status , Patient's Chart, lab work & pertinent test results  Airway Mallampati: II  TM Distance: >3 FB Neck ROM: Full    Dental no notable dental hx. (+) Teeth Intact   Pulmonary neg pulmonary ROS,    Pulmonary exam normal breath sounds clear to auscultation       Cardiovascular hypertension, Pt. on medications Normal cardiovascular exam Rhythm:Regular Rate:Normal     Neuro/Psych negative neurological ROS  negative psych ROS   GI/Hepatic Neg liver ROS, GERD  ,  Endo/Other  Obesity  Renal/GU Renal diseaseHx/o renal calculi  negative genitourinary   Musculoskeletal  (+) Arthritis , Osteoarthritis,  DJD right knee   Abdominal (+) + obese,   Peds  Hematology Hx/o DVT's on  anticoagulants- Eliquis- last dose 2/7   Anesthesia Other Findings   Reproductive/Obstetrics                             Anesthesia Physical Anesthesia Plan  ASA: III  Anesthesia Plan: Spinal   Post-op Pain Management:  Regional for Post-op pain   Induction:   PONV Risk Score and Plan:   Airway Management Planned: Natural Airway and Simple Face Mask  Additional Equipment:   Intra-op Plan:   Post-operative Plan:   Informed Consent: I have reviewed the patients History and Physical, chart, labs and discussed the procedure including the risks, benefits and alternatives for the proposed anesthesia with the patient or authorized representative who has indicated his/her understanding and acceptance.     Dental advisory given  Plan Discussed with: CRNA and Surgeon  Anesthesia Plan Comments:         Anesthesia Quick Evaluation  Anesthesia Physical Anesthesia Plan  ASA: III  Anesthesia Plan: MAC and Spinal   Post-op Pain Management:    Induction:   PONV Risk Score and Plan: 3 and Propofol infusion and TIVA  Airway Management Planned: Natural Airway and Nasal Cannula  Additional Equipment: None  Intra-op Plan:   Post-operative Plan:   Informed Consent: I have reviewed the patients History and  Physical, chart, labs and discussed the procedure including the risks, benefits and alternatives for the proposed anesthesia with the patient or authorized representative who has indicated his/her understanding and acceptance.       Plan Discussed with: CRNA  Anesthesia Plan Comments:        Anesthesia Quick Evaluation

## 2019-09-06 NOTE — Telephone Encounter (Signed)
Will forward to MD to check status of form. Jazmin Hartsell,CMA  

## 2019-09-09 ENCOUNTER — Other Ambulatory Visit: Payer: Self-pay | Admitting: Orthopaedic Surgery

## 2019-09-09 LAB — NOVEL CORONAVIRUS, NAA (HOSP ORDER, SEND-OUT TO REF LAB; TAT 18-24 HRS): SARS-CoV-2, NAA: NOT DETECTED

## 2019-09-09 MED ORDER — BUPIVACAINE LIPOSOME 1.3 % IJ SUSP
10.0000 mL | Freq: Once | INTRAMUSCULAR | Status: DC
  Filled 2019-09-09: qty 10

## 2019-09-09 MED ORDER — TRANEXAMIC ACID 1000 MG/10ML IV SOLN
2000.0000 mg | INTRAVENOUS | Status: DC
  Filled 2019-09-09: qty 20

## 2019-09-10 ENCOUNTER — Other Ambulatory Visit: Payer: Self-pay

## 2019-09-10 ENCOUNTER — Encounter (HOSPITAL_COMMUNITY)
Admission: RE | Disposition: A | Discharge status: Home Health | Payer: Self-pay | Source: Ambulatory Visit | Attending: Orthopaedic Surgery

## 2019-09-10 ENCOUNTER — Ambulatory Visit (HOSPITAL_COMMUNITY): Payer: BC Managed Care – PPO | Admitting: Emergency Medicine

## 2019-09-10 ENCOUNTER — Ambulatory Visit (HOSPITAL_COMMUNITY): Payer: BC Managed Care – PPO

## 2019-09-10 ENCOUNTER — Encounter (HOSPITAL_COMMUNITY): Payer: Self-pay | Admitting: Emergency Medicine

## 2019-09-10 ENCOUNTER — Observation Stay (HOSPITAL_COMMUNITY)
Admission: RE | Admit: 2019-09-10 | Discharge: 2019-09-11 | Disposition: A | Discharge status: Home Health | Payer: BC Managed Care – PPO | Source: Ambulatory Visit | Attending: Orthopaedic Surgery | Admitting: Orthopaedic Surgery

## 2019-09-10 DIAGNOSIS — Z86718 Personal history of other venous thrombosis and embolism: Secondary | ICD-10-CM | POA: Insufficient documentation

## 2019-09-10 DIAGNOSIS — M1612 Unilateral primary osteoarthritis, left hip: Secondary | ICD-10-CM

## 2019-09-10 DIAGNOSIS — Z6838 Body mass index (BMI) 38.0-38.9, adult: Secondary | ICD-10-CM | POA: Insufficient documentation

## 2019-09-10 DIAGNOSIS — E785 Hyperlipidemia, unspecified: Secondary | ICD-10-CM | POA: Insufficient documentation

## 2019-09-10 DIAGNOSIS — Z87442 Personal history of urinary calculi: Secondary | ICD-10-CM | POA: Insufficient documentation

## 2019-09-10 DIAGNOSIS — Z7901 Long term (current) use of anticoagulants: Secondary | ICD-10-CM | POA: Diagnosis not present

## 2019-09-10 DIAGNOSIS — Z91040 Latex allergy status: Secondary | ICD-10-CM | POA: Diagnosis not present

## 2019-09-10 DIAGNOSIS — Z881 Allergy status to other antibiotic agents status: Secondary | ICD-10-CM | POA: Insufficient documentation

## 2019-09-10 DIAGNOSIS — Z811 Family history of alcohol abuse and dependence: Secondary | ICD-10-CM | POA: Diagnosis not present

## 2019-09-10 DIAGNOSIS — Z880 Allergy status to penicillin: Secondary | ICD-10-CM | POA: Insufficient documentation

## 2019-09-10 DIAGNOSIS — Z8249 Family history of ischemic heart disease and other diseases of the circulatory system: Secondary | ICD-10-CM | POA: Insufficient documentation

## 2019-09-10 DIAGNOSIS — Z79899 Other long term (current) drug therapy: Secondary | ICD-10-CM | POA: Diagnosis not present

## 2019-09-10 DIAGNOSIS — Z807 Family history of other malignant neoplasms of lymphoid, hematopoietic and related tissues: Secondary | ICD-10-CM | POA: Insufficient documentation

## 2019-09-10 DIAGNOSIS — Z8261 Family history of arthritis: Secondary | ICD-10-CM | POA: Diagnosis not present

## 2019-09-10 DIAGNOSIS — Z96653 Presence of artificial knee joint, bilateral: Secondary | ICD-10-CM | POA: Diagnosis not present

## 2019-09-10 DIAGNOSIS — Z800 Family history of malignant neoplasm of digestive organs: Secondary | ICD-10-CM | POA: Diagnosis not present

## 2019-09-10 DIAGNOSIS — I10 Essential (primary) hypertension: Secondary | ICD-10-CM | POA: Insufficient documentation

## 2019-09-10 DIAGNOSIS — E669 Obesity, unspecified: Secondary | ICD-10-CM | POA: Diagnosis not present

## 2019-09-10 DIAGNOSIS — Z419 Encounter for procedure for purposes other than remedying health state, unspecified: Secondary | ICD-10-CM

## 2019-09-10 HISTORY — PX: TOTAL HIP ARTHROPLASTY: SHX124

## 2019-09-10 HISTORY — DX: Unilateral primary osteoarthritis, left hip: M16.12

## 2019-09-10 LAB — GLUCOSE, CAPILLARY: Glucose-Capillary: 177 mg/dL — ABNORMAL HIGH (ref 70–99)

## 2019-09-10 LAB — TYPE AND SCREEN
ABO/RH(D): A POS
Antibody Screen: NEGATIVE

## 2019-09-10 LAB — PROTIME-INR
INR: 1 (ref 0.8–1.2)
INR: 1.1 (ref 0.8–1.2)
Prothrombin Time: 13.1 seconds (ref 11.4–15.2)
Prothrombin Time: 14.3 seconds (ref 11.4–15.2)

## 2019-09-10 LAB — APTT
aPTT: 35 seconds (ref 24–36)
aPTT: 27 seconds (ref 24–36)

## 2019-09-10 SURGERY — ARTHROPLASTY, HIP, TOTAL, ANTERIOR APPROACH
Anesthesia: Monitor Anesthesia Care | Site: Hip | Laterality: Left

## 2019-09-10 MED ORDER — OXYCODONE HCL 5 MG PO TABS: 10.0000 mg | ORAL_TABLET | ORAL | Status: DC | PRN

## 2019-09-10 MED ORDER — OXYCODONE HCL 5 MG PO TABS: 5.0000 mg | ORAL_TABLET | Freq: Once | ORAL | Status: DC | PRN

## 2019-09-10 MED ORDER — SCOPOLAMINE 1 MG/3DAYS TD PT72
MEDICATED_PATCH | TRANSDERMAL | Status: AC
  Filled 2019-09-10: qty 1

## 2019-09-10 MED ORDER — BUPIVACAINE HCL (PF) 0.25 % IJ SOLN
INTRAMUSCULAR | Status: DC | PRN
  Administered 2019-09-10: 30 mL via INTRA_ARTICULAR

## 2019-09-10 MED ORDER — OXYCODONE HCL 5 MG PO TABS: 5.0000 mg | ORAL_TABLET | ORAL | Status: DC | PRN

## 2019-09-10 MED ORDER — ACETAMINOPHEN 500 MG PO TABS
1000.0000 mg | ORAL_TABLET | Freq: Four times a day (QID) | ORAL | Status: AC
  Administered 2019-09-10 – 2019-09-11 (×2): 1000 mg via ORAL
  Filled 2019-09-10 (×3): qty 2

## 2019-09-10 MED ORDER — CLINDAMYCIN PHOSPHATE 900 MG/50ML IV SOLN
900.0000 mg | INTRAVENOUS | Status: AC
  Administered 2019-09-10: 08:00:00 900 mg via INTRAVENOUS
  Filled 2019-09-10: qty 50

## 2019-09-10 MED ORDER — BUPIVACAINE HCL (PF) 0.25 % IJ SOLN
INTRAMUSCULAR | Status: AC
  Filled 2019-09-10: qty 30

## 2019-09-10 MED ORDER — PROMETHAZINE HCL 25 MG/ML IJ SOLN: 6.2500 mg | INTRAMUSCULAR | Status: DC | PRN

## 2019-09-10 MED ORDER — KETOROLAC TROMETHAMINE 30 MG/ML IJ SOLN: 30.0000 mg | Freq: Once | INTRAMUSCULAR | Status: DC | PRN

## 2019-09-10 MED ORDER — PHENYLEPHRINE HCL (PRESSORS) 10 MG/ML IV SOLN
INTRAVENOUS | Status: AC
  Filled 2019-09-10: qty 1

## 2019-09-10 MED ORDER — 0.9 % SODIUM CHLORIDE (POUR BTL) OPTIME
TOPICAL | Status: DC | PRN
  Administered 2019-09-10: 1000 mL

## 2019-09-10 MED ORDER — PROPOFOL 500 MG/50ML IV EMUL
INTRAVENOUS | Status: DC | PRN
  Administered 2019-09-10: 75 ug/kg/min via INTRAVENOUS

## 2019-09-10 MED ORDER — METHOCARBAMOL 500 MG PO TABS
500.0000 mg | ORAL_TABLET | Freq: Four times a day (QID) | ORAL | Status: DC | PRN
  Filled 2019-09-10: qty 1

## 2019-09-10 MED ORDER — ONDANSETRON HCL 4 MG/2ML IJ SOLN
4.0000 mg | Freq: Four times a day (QID) | INTRAMUSCULAR | Status: DC | PRN
  Administered 2019-09-10: 4 mg via INTRAVENOUS
  Filled 2019-09-10: qty 2

## 2019-09-10 MED ORDER — PROPOFOL 10 MG/ML IV BOLUS
INTRAVENOUS | Status: DC | PRN
  Administered 2019-09-10: 40 mg via INTRAVENOUS

## 2019-09-10 MED ORDER — LACTATED RINGERS IV SOLN
INTRAVENOUS | Status: DC
  Administered 2019-09-10 – 2019-09-11 (×2): via INTRAVENOUS

## 2019-09-10 MED ORDER — HYDROMORPHONE HCL 2 MG/ML IJ SOLN
INTRAMUSCULAR | Status: AC
  Filled 2019-09-10: qty 1

## 2019-09-10 MED ORDER — TRANEXAMIC ACID-NACL 1000-0.7 MG/100ML-% IV SOLN
1000.0000 mg | Freq: Once | INTRAVENOUS | Status: AC
  Administered 2019-09-10: 1000 mg via INTRAVENOUS
  Filled 2019-09-10: qty 100

## 2019-09-10 MED ORDER — STERILE WATER FOR IRRIGATION IR SOLN
Status: DC | PRN
  Administered 2019-09-10: 2000 mL

## 2019-09-10 MED ORDER — PROPOFOL 10 MG/ML IV BOLUS
INTRAVENOUS | Status: AC
  Filled 2019-09-10: qty 20

## 2019-09-10 MED ORDER — RIVAROXABAN 10 MG PO TABS: 20.0000 mg | ORAL_TABLET | Freq: Every evening | ORAL | Status: DC

## 2019-09-10 MED ORDER — PROPOFOL 10 MG/ML IV BOLUS
INTRAVENOUS | Status: AC
  Filled 2019-09-10: qty 60

## 2019-09-10 MED ORDER — KETOROLAC TROMETHAMINE 15 MG/ML IJ SOLN
7.5000 mg | Freq: Four times a day (QID) | INTRAMUSCULAR | Status: AC
  Administered 2019-09-10 – 2019-09-11 (×2): 7.5 mg via INTRAVENOUS
  Filled 2019-09-10 (×2): qty 1

## 2019-09-10 MED ORDER — DIPHENHYDRAMINE HCL 12.5 MG/5ML PO ELIX: 12.5000 mg | ORAL_SOLUTION | ORAL | Status: DC | PRN

## 2019-09-10 MED ORDER — PHENOL 1.4 % MT LIQD
1.0000 | OROMUCOSAL | Status: DC | PRN
  Filled 2019-09-10: qty 177

## 2019-09-10 MED ORDER — CHLORHEXIDINE GLUCONATE 4 % EX LIQD: 60.0000 mL | Freq: Once | CUTANEOUS | Status: DC

## 2019-09-10 MED ORDER — HYDROCHLOROTHIAZIDE 25 MG PO TABS
25.0000 mg | ORAL_TABLET | Freq: Every evening | ORAL | Status: DC
  Administered 2019-09-10: 25 mg via ORAL
  Filled 2019-09-10: qty 1

## 2019-09-10 MED ORDER — TRANEXAMIC ACID-NACL 1000-0.7 MG/100ML-% IV SOLN
INTRAVENOUS | Status: AC
  Filled 2019-09-10: qty 100

## 2019-09-10 MED ORDER — MIDAZOLAM HCL 2 MG/2ML IJ SOLN
INTRAMUSCULAR | Status: AC
  Filled 2019-09-10: qty 2

## 2019-09-10 MED ORDER — ACETAMINOPHEN 500 MG PO TABS
1000.0000 mg | ORAL_TABLET | Freq: Once | ORAL | Status: AC
  Administered 2019-09-10: 1000 mg via ORAL
  Filled 2019-09-10: qty 2

## 2019-09-10 MED ORDER — HYDROMORPHONE HCL 1 MG/ML IJ SOLN
INTRAMUSCULAR | Status: AC
  Administered 2019-09-10: 0.5 mg via INTRAVENOUS
  Filled 2019-09-10: qty 1

## 2019-09-10 MED ORDER — MECLIZINE HCL 25 MG PO TABS: 25.0000 mg | ORAL_TABLET | Freq: Three times a day (TID) | ORAL | Status: DC | PRN

## 2019-09-10 MED ORDER — POVIDONE-IODINE 10 % EX SWAB
2.0000 "application " | Freq: Once | CUTANEOUS | Status: AC
  Administered 2019-09-10: 2 via TOPICAL

## 2019-09-10 MED ORDER — ONDANSETRON HCL 4 MG/2ML IJ SOLN
INTRAMUSCULAR | Status: DC | PRN
  Administered 2019-09-10: 4 mg via INTRAVENOUS

## 2019-09-10 MED ORDER — SODIUM CHLORIDE 0.9 % IV SOLN
INTRAVENOUS | Status: DC | PRN
  Administered 2019-09-10: 09:00:00 35 ug/min via INTRAVENOUS

## 2019-09-10 MED ORDER — METOCLOPRAMIDE HCL 5 MG/ML IJ SOLN
5.0000 mg | Freq: Three times a day (TID) | INTRAMUSCULAR | Status: DC | PRN
  Administered 2019-09-10: 10 mg via INTRAVENOUS
  Filled 2019-09-10: qty 2

## 2019-09-10 MED ORDER — ONDANSETRON HCL 4 MG/2ML IJ SOLN
INTRAMUSCULAR | Status: AC
  Filled 2019-09-10: qty 2

## 2019-09-10 MED ORDER — METOCLOPRAMIDE HCL 5 MG PO TABS: 5.0000 mg | ORAL_TABLET | Freq: Three times a day (TID) | ORAL | Status: DC | PRN

## 2019-09-10 MED ORDER — MIDAZOLAM HCL 5 MG/5ML IJ SOLN
INTRAMUSCULAR | Status: DC | PRN
  Administered 2019-09-10: 2 mg via INTRAVENOUS
  Administered 2019-09-10: 1 mg via INTRAVENOUS

## 2019-09-10 MED ORDER — HYDROMORPHONE HCL 1 MG/ML IJ SOLN: 0.5000 mg | INTRAMUSCULAR | Status: DC | PRN

## 2019-09-10 MED ORDER — BUPIVACAINE IN DEXTROSE 0.75-8.25 % IT SOLN
INTRATHECAL | Status: DC | PRN
  Administered 2019-09-10: 1.7 mL via INTRATHECAL

## 2019-09-10 MED ORDER — MEPERIDINE HCL 50 MG/ML IJ SOLN: 6.2500 mg | INTRAMUSCULAR | Status: DC | PRN

## 2019-09-10 MED ORDER — DEXAMETHASONE SODIUM PHOSPHATE 10 MG/ML IJ SOLN
INTRAMUSCULAR | Status: AC
  Filled 2019-09-10: qty 1

## 2019-09-10 MED ORDER — BISACODYL 5 MG PO TBEC: 5.0000 mg | DELAYED_RELEASE_TABLET | Freq: Every day | ORAL | Status: DC | PRN

## 2019-09-10 MED ORDER — LIDOCAINE HCL (PF) 2 % IJ SOLN
INTRAMUSCULAR | Status: DC | PRN
  Administered 2019-09-10: 40 mg via INTRADERMAL

## 2019-09-10 MED ORDER — METHOCARBAMOL 500 MG IVPB - SIMPLE MED
INTRAVENOUS | Status: AC
  Administered 2019-09-10: 500 mg via INTRAVENOUS
  Filled 2019-09-10: qty 50

## 2019-09-10 MED ORDER — LACTATED RINGERS IV SOLN
INTRAVENOUS | Status: DC
  Administered 2019-09-10 (×3): via INTRAVENOUS

## 2019-09-10 MED ORDER — OXYCODONE HCL 5 MG/5ML PO SOLN: 5.0000 mg | Freq: Once | ORAL | Status: DC | PRN

## 2019-09-10 MED ORDER — HYDROMORPHONE HCL 1 MG/ML IJ SOLN
0.2500 mg | INTRAMUSCULAR | Status: DC | PRN
  Administered 2019-09-10 (×2): 0.5 mg via INTRAVENOUS

## 2019-09-10 MED ORDER — ACETAMINOPHEN 10 MG/ML IV SOLN
INTRAVENOUS | Status: AC
  Filled 2019-09-10: qty 100

## 2019-09-10 MED ORDER — CLINDAMYCIN PHOSPHATE 600 MG/50ML IV SOLN
600.0000 mg | Freq: Four times a day (QID) | INTRAVENOUS | Status: AC
  Administered 2019-09-10 (×2): 600 mg via INTRAVENOUS
  Filled 2019-09-10 (×2): qty 50

## 2019-09-10 MED ORDER — TRANEXAMIC ACID 1000 MG/10ML IV SOLN
INTRAVENOUS | Status: DC | PRN
  Administered 2019-09-10: 2000 mg via TOPICAL

## 2019-09-10 MED ORDER — TRANEXAMIC ACID-NACL 1000-0.7 MG/100ML-% IV SOLN
1000.0000 mg | INTRAVENOUS | Status: AC
  Administered 2019-09-10: 1000 mg via INTRAVENOUS

## 2019-09-10 MED ORDER — MENTHOL 3 MG MT LOZG: 1.0000 | LOZENGE | OROMUCOSAL | Status: DC | PRN

## 2019-09-10 MED ORDER — DOCUSATE SODIUM 100 MG PO CAPS
100.0000 mg | ORAL_CAPSULE | Freq: Two times a day (BID) | ORAL | Status: DC
  Administered 2019-09-10 – 2019-09-11 (×2): 100 mg via ORAL
  Filled 2019-09-10 (×2): qty 1

## 2019-09-10 MED ORDER — DEXAMETHASONE SODIUM PHOSPHATE 10 MG/ML IJ SOLN
INTRAMUSCULAR | Status: DC | PRN
  Administered 2019-09-10: 10 mg via INTRAVENOUS

## 2019-09-10 MED ORDER — BUPIVACAINE LIPOSOME 1.3 % IJ SUSP
INTRAMUSCULAR | Status: DC | PRN
  Administered 2019-09-10: 10 mL

## 2019-09-10 MED ORDER — ALBUMIN HUMAN 5 % IV SOLN
INTRAVENOUS | Status: DC | PRN
  Administered 2019-09-10: 09:00:00 via INTRAVENOUS

## 2019-09-10 MED ORDER — METHOCARBAMOL 500 MG IVPB - SIMPLE MED
500.0000 mg | Freq: Four times a day (QID) | INTRAVENOUS | Status: DC | PRN
  Administered 2019-09-10: 10:00:00 500 mg via INTRAVENOUS
  Filled 2019-09-10: qty 50

## 2019-09-10 MED ORDER — FENTANYL CITRATE (PF) 100 MCG/2ML IJ SOLN
INTRAMUSCULAR | Status: AC
  Filled 2019-09-10: qty 2

## 2019-09-10 MED ORDER — ALUM & MAG HYDROXIDE-SIMETH 200-200-20 MG/5ML PO SUSP: 30.0000 mL | ORAL | Status: DC | PRN

## 2019-09-10 MED ORDER — CHLORHEXIDINE GLUCONATE CLOTH 2 % EX PADS
6.0000 | MEDICATED_PAD | Freq: Every day | CUTANEOUS | Status: DC
  Administered 2019-09-10: 6 via TOPICAL

## 2019-09-10 MED ORDER — ACETAMINOPHEN 325 MG PO TABS: 325.0000 mg | ORAL_TABLET | Freq: Four times a day (QID) | ORAL | Status: DC | PRN

## 2019-09-10 MED ORDER — ACETAMINOPHEN 10 MG/ML IV SOLN
INTRAVENOUS | Status: DC | PRN
  Administered 2019-09-10: 1000 mg via INTRAVENOUS

## 2019-09-10 MED ORDER — HYDROMORPHONE HCL 1 MG/ML IJ SOLN
INTRAMUSCULAR | Status: DC | PRN
  Administered 2019-09-10: 1 mg via INTRAVENOUS

## 2019-09-10 MED ORDER — ONDANSETRON HCL 4 MG PO TABS: 4.0000 mg | ORAL_TABLET | Freq: Four times a day (QID) | ORAL | Status: DC | PRN

## 2019-09-10 SURGICAL SUPPLY — 44 items
BAG DECANTER FOR FLEXI CONT (MISCELLANEOUS) ×2 IMPLANT
BLADE SAW SGTL 18X1.27X75 (BLADE) ×2 IMPLANT
BOOTIES KNEE HIGH SLOAN (MISCELLANEOUS) ×2 IMPLANT
CELLS DAT CNTRL 66122 CELL SVR (MISCELLANEOUS) ×1 IMPLANT
COVER PERINEAL POST (MISCELLANEOUS) ×2 IMPLANT
COVER SURGICAL LIGHT HANDLE (MISCELLANEOUS) ×2 IMPLANT
COVER WAND RF STERILE (DRAPES) IMPLANT
DECANTER SPIKE VIAL GLASS SM (MISCELLANEOUS) ×2 IMPLANT
DRAPE IMP U-DRAPE 54X76 (DRAPES) ×2 IMPLANT
DRAPE STERI IOBAN 125X83 (DRAPES) ×2 IMPLANT
DRAPE U-SHAPE 47X51 STRL (DRAPES) ×4 IMPLANT
DRSG AQUACEL AG ADV 3.5X 6 (GAUZE/BANDAGES/DRESSINGS) ×2 IMPLANT
DURAPREP 26ML APPLICATOR (WOUND CARE) ×2 IMPLANT
ELECT BLADE TIP CTD 4 INCH (ELECTRODE) ×2 IMPLANT
ELECT REM PT RETURN 15FT ADLT (MISCELLANEOUS) ×2 IMPLANT
ELIMINATOR HOLE APEX DEPUY (Hips) ×1 IMPLANT
GLOVE BIO SURGEON STRL SZ8 (GLOVE) ×4 IMPLANT
GLOVE BIOGEL PI IND STRL 8 (GLOVE) ×2 IMPLANT
GLOVE BIOGEL PI INDICATOR 8 (GLOVE) ×2
GOWN STRL REUS W/TWL XL LVL3 (GOWN DISPOSABLE) ×4 IMPLANT
HEAD FEMORAL 32 CERAMIC (Hips) ×1 IMPLANT
HOLDER FOLEY CATH W/STRAP (MISCELLANEOUS) ×2 IMPLANT
KIT TURNOVER KIT A (KITS) IMPLANT
LINER ACETABULAR 32X50 (Liner) ×1 IMPLANT
LINER PINN ACET GRIP 50X100 ×1 IMPLANT
MANIFOLD NEPTUNE II (INSTRUMENTS) ×2 IMPLANT
NEEDLE HYPO 22GX1.5 SAFETY (NEEDLE) ×2 IMPLANT
NS IRRIG 1000ML POUR BTL (IV SOLUTION) ×2 IMPLANT
PACK ANTERIOR HIP CUSTOM (KITS) ×2 IMPLANT
PROTECTOR NERVE ULNAR (MISCELLANEOUS) ×2 IMPLANT
RETRACTOR WND ALEXIS 18 MED (MISCELLANEOUS) ×1 IMPLANT
RTRCTR WOUND ALEXIS 18CM MED (MISCELLANEOUS) ×2
STEM FEMORAL SZ 6MM STD ACTIS (Stem) ×1 IMPLANT
SUT ETHIBOND NAB CT1 #1 30IN (SUTURE) ×4 IMPLANT
SUT VIC AB 1 CT1 36 (SUTURE) ×2 IMPLANT
SUT VIC AB 2-0 CT1 27 (SUTURE) ×2
SUT VIC AB 2-0 CT1 TAPERPNT 27 (SUTURE) ×1 IMPLANT
SUT VIC AB 3-0 PS2 18 (SUTURE) ×2
SUT VIC AB 3-0 PS2 18XBRD (SUTURE) ×1 IMPLANT
SUT VLOC 180 0 24IN GS25 (SUTURE) ×2 IMPLANT
SYR 50ML LL SCALE MARK (SYRINGE) ×2 IMPLANT
TRAY FOL W/BAG SLVR 16FR STRL (SET/KITS/TRAYS/PACK) IMPLANT
TRAY FOLEY W/BAG SLVR 16FR LF (SET/KITS/TRAYS/PACK) ×2
YANKAUER SUCT BULB TIP 10FT TU (MISCELLANEOUS) ×2 IMPLANT

## 2019-09-10 NOTE — Op Note (Signed)
PRE-OP DIAGNOSIS:  LEFT HIP DEGENERATIVE JOINT DISEASE POST-OP DIAGNOSIS: same PROCEDURE:  LEFT TOTAL HIP ARTHROPLASTY ANTERIOR APPROACH ANESTHESIA:  Spinal and MAC SURGEON:  Melrose Nakayama MD ASSISTANT:  Loni Dolly PA-C   INDICATIONS FOR PROCEDURE:  The patient is a 63 y.o. female with a long history of a painful hip.  This has persisted despite multiple conservative measures.  The patient has persisted with pain and dysfunction making rest and activity difficult.  A total hip replacement is offered as surgical treatment.  Informed operative consent was obtained after discussion of possible complications including reaction to anesthesia, infection, neurovascular injury, dislocation, DVT, PE, and death.  The importance of the postoperative rehab program to optimize result was stressed with the patient.  SUMMARY OF FINDINGS AND PROCEDURE:  Under the above anesthesia through a anterior approach an the Hana table a left THR was performed.  The patient had severe degenerative change and good bone quality.  We used DePuy components to replace the hip and these were size 5 standard  Actis femur capped with a +1 73m ceramic hip ball.  On the acetabular side we used a size 50 Gription shell with a plus 0 neutral polyethylene liner.  We did use a hole eliminator.  ALoni DollyPA-C assisted throughout and was invaluable to the completion of the case in that he helped position and retract while I performed the procedure.  He also closed simultaneously to help minimize OR time.  I used fluoroscopy throughout the case to check position of components and leg lengths and read all these views myself.  DESCRIPTION OF PROCEDURE:  The patient was taken to the OR suite where the above anesthetic was applied.  The patient was then positioned on the Hana table supine.  All bony prominences were appropriately padded.  Prep and drape was then performed in normal sterile fashion.  The patient was given cleocin preoperative  antibiotic and an appropriate time out was performed.  We then took an anterior approach to the left hip.  Dissection was taken through adipose to the tensor fascia lata fascia.  This structure was incised longitudinally and we dissected in the intermuscular interval just medial to this muscle.  Cobra retractors were placed superior and inferior to the femoral neck superficial to the capsule.  A capsular incision was then made and the retractors were placed along the femoral neck.  Xray was brought in to get a good level for the femoral neck cut which was made with an oscillating saw and osteotome.  The femoral head was removed with a corkscrew.  The acetabulum was exposed and some labral tissues were excised. Reaming was taken to the inside wall of the pelvis and sequentially up to 1 mm smaller than the actual component.  A trial of components was done and then the aforementioned acetabular shell was placed in appropriate tilt and anteversion confirmed by fluoroscopy. The liner was placed along with the hole eliminator and attention was turned to the femur.  The leg was brought down and over into adduction and the elevator bar was used to raise the femur up gently in the wound.  The piriformis was released with care taken to preserve the obturator internus attachment and all of the posterior capsule. The femur was reamed and then broached to the appropriate size.  A trial reduction was done and the aforementioned head and neck assembly gave u[place] best stability in extension with external rotation.  Leg lengths were felt to be about equal  by fluoroscopic exam.  The trial components were removed and the wound irrigated.  We then placed the femoral component in appropriate anteversion.  The head was applied to a dry stem neck and the hip again reduced.  It was again stable in the aforementioned position.  The would was irrigated again followed by re-approximation of anterior capsule with ethibond suture. Tensor  fascia was repaired with V-loc suture  followed by deep closure with #O and #2 undyed vicryl.  Skin was closed with subQ stitch and steristrips followed by a sterile dressing.  EBL and IOF can be obtained from anesthesia records.  DISPOSITION:  The patient was extubated in the OR and taken to PACU in stable condition to be admitted to the Orthopedic Surgery for appropriate post-op care to include perioperative antibiotics and DVT prophylaxis.

## 2019-09-10 NOTE — Transfer of Care (Signed)
Immediate Anesthesia Transfer of Care Note  Patient: Michele Morrison  Procedure(s) Performed: LEFT TOTAL HIP ARTHROPLASTY ANTERIOR APPROACH (Left Hip)  Patient Location: PACU  Anesthesia Type:Spinal  Level of Consciousness: awake, alert , oriented and patient cooperative  Airway & Oxygen Therapy: Patient Spontanous Breathing and Patient connected to face mask oxygen  Post-op Assessment: Report given to RN and Post -op Vital signs reviewed and stable  Post vital signs: stable  Last Vitals:  Vitals Value Taken Time  BP 95/58 09/10/19 0930  Temp    Pulse 62 09/10/19 0938  Resp 13 09/10/19 0933  SpO2 96 % 09/10/19 0938  Vitals shown include unvalidated device data.  Last Pain:  Vitals:   09/10/19 0612  TempSrc: Oral  PainSc:       Patients Stated Pain Goal: 4 ([id number])  Complications: No apparent anesthesia complications

## 2019-09-10 NOTE — Addendum Note (Signed)
Addendum  created 09/10/19 1114 by Lissa Morales, CRNA   Child order released for a procedure order, Clinical Note Signed, Intraprocedure Blocks edited, Intraprocedure Meds edited

## 2019-09-10 NOTE — Interval H&P Note (Signed)
History and Physical Interval Note:  09/10/2019 7:30 AM  Michele Morrison  has presented today for surgery, with the diagnosis of left hip degenerative joint disease.  The various methods of treatment have been discussed with the patient and family. After consideration of risks, benefits and other options for treatment, the patient has consented to  Procedure(s): LEFT TOTAL HIP ARTHROPLASTY ANTERIOR APPROACH (Left) as a surgical intervention.  The patient's history has been reviewed, patient examined, no change in status, stable for surgery.  I have reviewed the patient's chart and labs.  Questions were answered to the patient's satisfaction.     Hessie Dibble

## 2019-09-10 NOTE — Anesthesia Postprocedure Evaluation (Signed)
Anesthesia Post Note  Patient: Michele Morrison  Procedure(s) Performed: LEFT TOTAL HIP ARTHROPLASTY ANTERIOR APPROACH (Left Hip)     Patient location during evaluation: PACU Anesthesia Type: MAC and Spinal Level of consciousness: oriented and awake and alert Pain management: pain level controlled Vital Signs Assessment: post-procedure vital signs reviewed and stable Respiratory status: spontaneous breathing, respiratory function stable and patient connected to nasal cannula oxygen Cardiovascular status: blood pressure returned to baseline and stable Postop Assessment: no headache, no backache and no apparent nausea or vomiting Anesthetic complications: no    Last Vitals:  Vitals:   09/10/19 0945 09/10/19 1000  BP: 107/63 99/63  Pulse: (!) 59 (!) 58  Resp: 18 10  Temp:  (!) 36.3 C  SpO2: 99% 94%    Last Pain:  Vitals:   09/10/19 0951  TempSrc:   PainSc: [place]

## 2019-09-10 NOTE — Progress Notes (Signed)
Patient has been groggy and lethargic since arrival from PACU at 1048am. No PRN pain medications given since prior to arrival to floor. Every time patient has attempted to sit up straight in bed, patient head begins nodding and begins to vomit and then falls back asleep . Called second nurse to evaluate patient also. MD aware. Rapid Response nurse called and assessed who also thought it was medication/anesthesia induced and blood and volume loss related stated to call him when PT ambulates patient for the first time or patients grogginess becomes worse.

## 2019-09-10 NOTE — Anesthesia Procedure Notes (Signed)
Spinal  Patient location during procedure: OR End time: 09/10/2019 7:38 AM Staffing Resident/CRNA: Lissa Morales, CRNA Preanesthetic Checklist Completed: patient identified, site marked, surgical consent, pre-op evaluation, timeout performed, IV checked, risks and benefits discussed and monitors and equipment checked Spinal Block Patient position: sitting Prep: DuraPrep Patient monitoring: heart rate, continuous pulse ox and blood pressure Approach: midline Location: L3-4 Injection technique: single-shot Needle Needle type: Pencan  Needle gauge: 24 G Needle length: 9 cm Additional Notes Expiration date of kit checked and confirmed. Patient tolerated procedure well, without complications.

## 2019-09-10 NOTE — Progress Notes (Signed)
PT Cancellation Note  Patient Details Name: Michele Morrison MRN: [id number] DOB: [birth year]   Cancelled Treatment:    Reason Eval/Treat Not Completed: Medical issues which prohibited therapy(pt is having nausea and vomiting, will follow.)   Philomena Doheny PT 09/10/2019  Acute Rehabilitation Services Pager [phone] Office [phone]

## 2019-09-10 NOTE — Progress Notes (Signed)
Primary RN, Jay Schlichter, requested for me to look at patient with her this afternoon secondary to patient LOC.     After patient assessment I called attending MD to communicate current patient status.   Patient observed to have pinpoint pupils, difficulty following commands, and has been sleeping since she presented to the floor at 1000. PACU RN gave patient 1mg  of dilaudid and 500mg  robaxin.   Patient oriented when wakened by voice. Patient can state name, dat of birth, location, month and year. Patient having difficulty following commands.  Patient placed on 4L 02 per [place] to keep oxygen saturation above 90%   RN requested fluid bolus secondary to estimated blood loss in OR, and also asked if  Narcan would be an appropriate choice for this patient secondary to pupil assessment, and drowsiness x4.5 hours.   Attending MD requested that I speak with CRNA that cared for her this AM. CRNA and Attending MD did not want to give narcan at this time, but requested increased oxygen supplementation to see if this helped patient condition.   RN called RR RN to patient bedside to observe patient statues and make recommendations for interventions.  . . . Will continue to monitor patient.

## 2019-09-11 ENCOUNTER — Encounter (HOSPITAL_COMMUNITY): Payer: Self-pay | Admitting: Orthopaedic Surgery

## 2019-09-11 DIAGNOSIS — M1612 Unilateral primary osteoarthritis, left hip: Secondary | ICD-10-CM | POA: Diagnosis not present

## 2019-09-11 LAB — CBC
HCT: 34.5 % — ABNORMAL LOW (ref 36.0–46.0)
Hemoglobin: 11.1 g/dL — ABNORMAL LOW (ref 12.0–15.0)
MCH: 30.6 pg (ref 26.0–34.0)
MCHC: 32.2 g/dL (ref 30.0–36.0)
MCV: 95 fL (ref 80.0–100.0)
Platelets: 160 10*3/uL (ref 150–400)
RBC: 3.63 MIL/uL — ABNORMAL LOW (ref 3.87–5.11)
RDW: 12.4 % (ref 11.5–15.5)
WBC: 12.6 10*3/uL — ABNORMAL HIGH (ref 4.0–10.5)
nRBC: 0 % (ref 0.0–0.2)

## 2019-09-11 LAB — BASIC METABOLIC PANEL
Anion gap: 8 (ref 5–15)
BUN: 10 mg/dL (ref 8–23)
CO2: 27 mmol/L (ref 22–32)
Calcium: 9.7 mg/dL (ref 8.9–10.3)
Chloride: 102 mmol/L (ref 98–111)
Creatinine, Ser: 0.55 mg/dL (ref 0.44–1.00)
GFR calc Af Amer: >60 mL/min (ref >60)
GFR calc non Af Amer: >60 mL/min (ref >60)
Glucose, Bld: 128 mg/dL — ABNORMAL HIGH (ref 70–99)
Potassium: 3.9 mmol/L (ref 3.5–5.1)
Sodium: 137 mmol/L (ref 135–145)

## 2019-09-11 MED ORDER — TIZANIDINE HCL 4 MG PO TABS: 4.0000 mg | ORAL_TABLET | Freq: Four times a day (QID) | ORAL | 1 refills | Status: DC | PRN

## 2019-09-11 NOTE — Progress Notes (Signed)
Therapy Plan: HHPT  [facility]) Has DME

## 2019-09-11 NOTE — Plan of Care (Signed)
Patient discharged home in stable condition 

## 2019-09-11 NOTE — Discharge Summary (Signed)
Patient ID: Michele Morrison MRN: [id number] DOB/AGE: May 16, 1956 63 y.o.  Admit date: 09/10/2019 Discharge date: 09/11/2019  Admission Diagnoses:  Principal Problem:   Primary localized osteoarthritis of left hip Active Problems:   Primary osteoarthritis of left hip   Discharge Diagnoses:  Same  Past Medical History:  Diagnosis Date  . Arthritis    thumbs,spine  . 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 Complication of anesthesia 12/2018   awareness during surgery  . Diverticulosis   . H/O blood clots 1996   after a 14 hour plane flight  . History of kidney stones   . HTN (hypertension)   . Hyperlipidemia   . Kidney stones   . PONV (postoperative nausea and vomiting)   . Pre-diabetes     Surgeries: Procedure(s): LEFT TOTAL HIP ARTHROPLASTY ANTERIOR APPROACH on 09/10/2019   Consultants:   Discharged Condition: Improved  Hospital Course: Michele Morrison is an 63 y.o. female who was admitted 09/10/2019 for operative treatment ofPrimary localized osteoarthritis of left hip. Patient has severe unremitting pain that affects sleep, daily activities, and work/hobbies. After pre-op clearance the patient was taken to the operating room on 09/10/2019 and underwent  Procedure(s): LEFT TOTAL HIP ARTHROPLASTY ANTERIOR APPROACH.    Patient was given perioperative antibiotics:  Anti-infectives (From admission, onward)   Start     Dose/Rate Route Frequency Ordered Stop   09/10/19 1400  clindamycin (CLEOCIN) IVPB 600 mg     600 mg 100 mL/hr over 30 Minutes Intravenous Every 6 hours 09/10/19 1055 09/10/19 2043   09/10/19 0600  clindamycin (CLEOCIN) IVPB 900 mg     900 mg 100 mL/hr over 30 Minutes Intravenous On call to O.R. 09/10/19 [id number] 09/10/19 0804       Patient was given sequential compression devices, early ambulation, and chemoprophylaxis to prevent DVT.  Patient benefited maximally from hospital stay and there  were no complications.    Recent vital signs:  Patient Vitals for the past 24 hrs:  BP Temp Temp src Pulse Resp SpO2  09/11/19 0535 132/67 97.9 F (36.6 C) Oral 72 16 99 %  09/11/19 0116 119/66 (!) 97.5 F (36.4 C) Oral (!) 58 16 98 %  09/10/19 2155 128/68 (!) 97.5 F (36.4 C) Oral (!) 51 16 100 %  09/10/19 1757 117/68 97.6 F (36.4 C) Oral 61 16 99 %  09/10/19 1409 (!) 142/80 (!) 97.5 F (36.4 C) Oral 73 16 97 %  09/10/19 1351 (!) 146/78 (!) 97.4 F (36.3 C) Axillary 70 16 93 %  09/10/19 1248 116/70 (!) 97.4 F (36.3 C) Axillary (!) 58 16 93 %  09/10/19 1143 113/63 (!) 97.5 F (36.4 C) Oral 60 12 93 %  09/10/19 1048 108/60 (!) 97.4 F (36.3 C) Oral (!) 59 12 93 %  09/10/19 1006 104/60 - - (!) 58 10 93 %  09/10/19 1000 99/63 (!) 97.4 F (36.3 C) - (!) 58 10 94 %  09/10/19 0945 107/63 - - (!) 59 18 99 %  09/10/19 0930 (!) 95/58 (!) 97.5 F (36.4 C) - 63 16 96 %  09/10/19 0927 (!) 101/58 - - 64 12 97 %     Recent laboratory studies:  Recent Labs    09/10/19 0605 09/10/19 0946 09/11/19 0517  WBC  --   --  12.6*  HGB  --   --  11.1*  HCT  --   --  34.5*  PLT  --   --  160  NA  --   --  137  K  --   --  3.9  CL  --   --  102  CO2  --   --  27  BUN  --   --  10  CREATININE  --   --  0.55  GLUCOSE  --   --  128*  INR 1.0 1.1  --   CALCIUM  --   --  9.7     Discharge Medications:   Allergies as of 09/11/2019      Reactions   Latex Itching, Rash, Other (See Comments)   Severe Blisters   Cephalexin Rash   Vancomycin Rash   Received during knee surgery 12/25/18, developed flushed skin and rash, resolved with administration of benadryl   Neosporin [bacitracin-polymyxin B]    Blisters    Other    "any over the counter topical creams"   Penicillins Other (See Comments)   Did it involve swelling of the face/tongue/throat, SOB, or low BP? Unknown-Pt NEVER taken  Did it involve sudden or severe rash/hives, skin peeling, or any reaction on the inside of your mouth or  nose? Unknown pt NEVER taken  Did you need to seek medical attention at a hospital or doctor's office? Unknown-pt NEVER taken  When did it last happen? pt NEVER taken this medication If all above answers are "NO", may proceed with cephalosporin use. Mother had a reaction while pregnant with pt.      Medication List    TAKE these medications   acetaminophen 500 MG tablet Commonly known as: TYLENOL Take 500 mg by mouth every 6 (six) hours as needed for moderate pain.   hydrochlorothiazide 25 MG tablet Commonly known as: HYDRODIURIL Take 1 tablet (25 mg total) by mouth daily. What changed: when to take this   meclizine 25 MG tablet Commonly known as: ANTIVERT Take 25 mg by mouth every 8 (eight) hours as needed for dizziness.   rivaroxaban 20 MG Tabs tablet Commonly known as: Xarelto TAKE 1 TABLET BY MOUTH EVERY DAY WITH SUPPER What changed:   how much to take  how to take this  when to take this  additional instructions   tiZANidine 4 MG tablet Commonly known as: Zanaflex Take 1 tablet (4 mg total) by mouth every 6 (six) hours as needed.            Durable Medical Equipment  (From admission, onward)         Start     Ordered   09/10/19 1056  DME Walker rolling  Once    Question:  Patient needs a walker to treat with the following condition  Answer:  Primary osteoarthritis of left hip   09/10/19 1055   09/10/19 1056  DME 3 n 1  Once     09/10/19 1055   09/10/19 1056  DME Bedside commode  Once    Question:  Patient needs a bedside commode to treat with the following condition  Answer:  Primary osteoarthritis of left hip   09/10/19 1055          Diagnostic Studies: Dg C-arm 1-60 Min-no Report  Result Date: 09/10/2019 CLINICAL DATA:  Status post left hip replacement. EXAM: OPERATIVE left HIP (WITH PELVIS IF PERFORMED) 2 VIEWS TECHNIQUE: Fluoroscopic spot image(s) were submitted for interpretation post-operatively. Radiation exposure index: 3.8681 mGy.  COMPARISON:  August 12, 2019. FINDINGS: Two intraoperative fluoroscopic images were obtained of the left hip. The left femoral and acetabular  components appear to be well situated. IMPRESSION: Fluoroscopic guidance provided during left total hip arthroplasty. Electronically Signed   By: Marijo Conception M.D.   On: 09/10/2019 09:47   Dg Hip Operative Unilat W Or W/o Pelvis Left  Result Date: 09/10/2019 CLINICAL DATA:  Status post left hip replacement. EXAM: OPERATIVE left HIP (WITH PELVIS IF PERFORMED) 2 VIEWS TECHNIQUE: Fluoroscopic spot image(s) were submitted for interpretation post-operatively. Radiation exposure index: 3.8681 mGy. COMPARISON:  August 12, 2019. FINDINGS: Two intraoperative fluoroscopic images were obtained of the left hip. The left femoral and acetabular components appear to be well situated. IMPRESSION: Fluoroscopic guidance provided during left total hip arthroplasty. Electronically Signed   By: Marijo Conception M.D.   On: 09/10/2019 09:47    Disposition: Discharge disposition: 01-Home or Self Care       Discharge Instructions    Call MD / Call 911   Complete by: As directed    If you experience chest pain or shortness of breath, CALL 911 and be transported to the hospital emergency room.  If you develope a fever above 101 F, pus (white drainage) or increased drainage or redness at the wound, or calf pain, call your surgeon's office.   Constipation Prevention   Complete by: As directed    Drink plenty of fluids.  Prune juice may be helpful.  You may use a stool softener, such as Colace (over the counter) 100 mg twice a day.  Use MiraLax (over the counter) for constipation as needed.   Diet - low sodium heart healthy   Complete by: As directed    Discharge instructions   Complete by: As directed    INSTRUCTIONS AFTER JOINT REPLACEMENT   Remove items at home which could result in a fall. This includes throw rugs or furniture in walking pathways ICE to the affected  joint every three hours while awake for 30 minutes at a time, for at least the first 3-5 days, and then as needed for pain and swelling.  Continue to use ice for pain and swelling. You may notice swelling that will progress down to the foot and ankle.  This is normal after surgery.  Elevate your leg when you are not up walking on it.   Continue to use the breathing machine you got in the hospital (incentive spirometer) which will help keep your temperature down.  It is common for your temperature to cycle up and down following surgery, especially at night when you are not up moving around and exerting yourself.  The breathing machine keeps your lungs expanded and your temperature down.   DIET:  As you were doing prior to hospitalization, we recommend a well-balanced diet.  DRESSING / WOUND CARE / SHOWERING  You may shower 3 days after surgery, but keep the wounds dry during showering.  You may use an occlusive plastic wrap (Press'n Seal for example), NO SOAKING/SUBMERGING IN THE BATHTUB.  If the bandage gets wet, change with a clean dry gauze.  If the incision gets wet, pat the wound dry with a clean towel.  ACTIVITY  Increase activity slowly as tolerated, but follow the weight bearing instructions below.   No driving for 6 weeks or until further direction given by your physician.  You cannot drive while taking narcotics.  No lifting or carrying greater than 10 lbs. until further directed by your surgeon. Avoid periods of inactivity such as sitting longer than an hour when not asleep. This helps prevent blood clots.  You  may return to work once you are authorized by your doctor.     WEIGHT BEARING   Weight bearing as tolerated with assist device (walker, cane, etc) as directed, use it as long as suggested by your surgeon or therapist, typically at least 4-6 weeks.   EXERCISES  Results after joint replacement surgery are often greatly improved when you follow the exercise, range of motion  and muscle strengthening exercises prescribed by your doctor. Safety measures are also important to protect the joint from further injury. Any time any of these exercises cause you to have increased pain or swelling, decrease what you are doing until you are comfortable again and then slowly increase them. If you have problems or questions, call your caregiver or physical therapist for advice.   Rehabilitation is important following a joint replacement. After just a few days of immobilization, the muscles of the leg can become weakened and shrink (atrophy).  These exercises are designed to build up the tone and strength of the thigh and leg muscles and to improve motion. Often times heat used for twenty to thirty minutes before working out will loosen up your tissues and help with improving the range of motion but do not use heat for the first two weeks following surgery (sometimes heat can increase post-operative swelling).   These exercises can be done on a training (exercise) mat, on the floor, on a table or on a bed. Use whatever works the best and is most comfortable for you.    Use music or television while you are exercising so that the exercises are a pleasant break in your day. This will make your life better with the exercises acting as a break in your routine that you can look forward to.   Perform all exercises about fifteen times, three times per day or as directed.  You should exercise both the operative leg and the other leg as well.   Exercises include:   Quad Sets - Tighten up the muscle on the front of the thigh (Quad) and hold for 5-10 seconds.   Straight Leg Raises - With your knee straight (if you were given a brace, keep it on), lift the leg to 60 degrees, hold for 3 seconds, and slowly lower the leg.  Perform this exercise against resistance later as your leg gets stronger.  Leg Slides: Lying on your back, slowly slide your foot toward your buttocks, bending your knee up off the  floor (only go as far as is comfortable). Then slowly slide your foot back down until your leg is flat on the floor again.  Angel Wings: Lying on your back spread your legs to the side as far apart as you can without causing discomfort.  Hamstring Strength:  Lying on your back, push your heel against the floor with your leg straight by tightening up the muscles of your buttocks.  Repeat, but this time bend your knee to a comfortable angle, and push your heel against the floor.  You may put a pillow under the heel to make it more comfortable if necessary.   A rehabilitation program following joint replacement surgery can speed recovery and prevent re-injury in the future due to weakened muscles. Contact your doctor or a physical therapist for more information on knee rehabilitation.    CONSTIPATION  Constipation is defined medically as fewer than three stools per week and severe constipation as less than one stool per week.  Even if you have a regular bowel pattern at  home, your normal regimen is likely to be disrupted due to multiple reasons following surgery.  Combination of anesthesia, postoperative narcotics, change in appetite and fluid intake all can affect your bowels.   YOU MUST use at least one of the following options; they are listed in order of increasing strength to get the job done.  They are all available over the counter, and you may need to use some, POSSIBLY even all of these options:    Drink plenty of fluids (prune juice may be helpful) and high fiber foods Colace 100 mg by mouth twice a day  Senokot for constipation as directed and as needed Dulcolax (bisacodyl), take with full glass of water  Miralax (polyethylene glycol) once or twice a day as needed.  If you have tried all these things and are unable to have a bowel movement in the first 3-4 days after surgery call either your surgeon or your primary doctor.    If you experience loose stools or diarrhea, hold the  medications until you stool forms back up.  If your symptoms do not get better within 1 week or if they get worse, check with your doctor.  If you experience "the worst abdominal pain ever" or develop nausea or vomiting, please contact the office immediately for further recommendations for treatment.   ITCHING:  If you experience itching with your medications, try taking only a single pain pill, or even half a pain pill at a time.  You can also use Benadryl over the counter for itching or also to help with sleep.   TED HOSE STOCKINGS:  Use stockings on both legs until for at least 2 weeks or as directed by physician office. They may be removed at night for sleeping.  MEDICATIONS:  See your medication summary on the "After Visit Summary" that nursing will review with you.  You may have some home medications which will be placed on hold until you complete the course of blood thinner medication.  It is important for you to complete the blood thinner medication as prescribed.  PRECAUTIONS:  If you experience chest pain or shortness of breath - call 911 immediately for transfer to the hospital emergency department.   If you develop a fever greater that 101 F, purulent drainage from wound, increased redness or drainage from wound, foul odor from the wound/dressing, or calf pain - CONTACT YOUR SURGEON.                                                   FOLLOW-UP APPOINTMENTS:  If you do not already have a post-op appointment, please call the office for an appointment to be seen by your surgeon.  Guidelines for how soon to be seen are listed in your "After Visit Summary", but are typically between 1-4 weeks after surgery.  OTHER INSTRUCTIONS:   Knee Replacement:  Do not place pillow under knee, focus on keeping the knee straight while resting. CPM instructions: 0-90 degrees, 2 hours in the morning, 2 hours in the afternoon, and 2 hours in the evening. Place foam block, curve side up under heel at all times  except when in CPM or when walking.  DO NOT modify, tear, cut, or change the foam block in any way.  MAKE SURE YOU:  Understand these instructions.  Get help right away if you are not  doing well or get worse.    Thank you for letting us be a part of your medical care team.  It is a privilege we respect greatly.  We hope these instructions will help you stay on track for a fast and full recovery!   Increase activity slowly as tolerated   Complete by: As directed       Follow-up Information    Melrose Nakayama, MD In 2 weeks.   Specialty: Orthopedic Surgery Contact information: [place] [address] [phone]            Signed: [name] Antwone Capozzoli 09/11/2019, 7:30 AM

## 2019-09-11 NOTE — Progress Notes (Signed)
Physical Therapy Treatment Patient Details Name: Michele Morrison MRN: [id number] DOB: May 15, 1956 Today's Date: 09/11/2019    History of Present Illness 63 yo female s/p L DA-THA on 09/11/19. PMH includes R TKR, OA, DVT, HTN, HCC (clotting disorder), HLD, pre-DM.    PT Comments    Pt with progressed ambulation distance this session, and presented with less antalgic gait during walking. Pt proficiently ascended and descended 1 step demonstrating ability to enter home upon d/c. Lastly, PT administered, demonstrated, and practiced THA HEP with pt, pt performed all exercises well and required very min verbal cuing. Pt appropriate to d/c home from mobility standpoint, RN aware.    Follow Up Recommendations  Follow surgeon's recommendation for DC plan and follow-up therapies;Supervision for mobility/OOB(HEP)     Equipment Recommendations  None recommended by PT    Recommendations for Other Services       Precautions / Restrictions Precautions Precautions: Fall Restrictions Weight Bearing Restrictions: No Other Position/Activity Restrictions: WBAT    Mobility  Bed Mobility Overal bed mobility: Needs Assistance Bed Mobility: Supine to Sit     Supine to sit: Min assist     General bed mobility comments: pt up in chair upon PT arrival, requests back to chair upon PT exit  Transfers Overall transfer level: Needs assistance Equipment used: Rolling walker (2 wheeled) Transfers: Sit to/from Stand Sit to Stand: Supervision         General transfer comment: for safety; sit to stand x2 both times from recliner with proper hand placement when rising/lowering to sitting.  Ambulation/Gait Ambulation/Gait assistance: Supervision Gait Distance (Feet): 225 Feet Assistive device: Rolling walker (2 wheeled) Gait Pattern/deviations: Decreased stride length;Step-through pattern;Trunk flexed Gait velocity: decr   General Gait Details: supervision for safety, verbal cuing for upright  posture and looking forward as opposed to downwards x1.   Stairs Stairs: Yes Stairs assistance: Min guard Stair Management: No rails;Step to pattern;With walker;Forwards Number of Stairs: 1 General stair comments: min guard for safety, verbal cuing for sequencing and how caregiver should guard and steady RW. Pt able to participate in verbal teachback for proper sequencing.   Wheelchair Mobility    Modified Rankin (Stroke Patients Only)       Balance Overall balance assessment: Mild deficits observed, not formally tested                                          Cognition Arousal/Alertness: Awake/alert Behavior During Therapy: WFL for tasks assessed/performed Overall Cognitive Status: Within Functional Limits for tasks assessed                                        Exercises Total Joint Exercises Ankle Circles/Pumps: AROM;Both;10 reps;Seated Quad Sets: AROM;Both;10 reps;Seated Heel Slides: AAROM;Left;5 reps;Seated Hip ABduction/ADduction: AAROM;AROM;Left;5 reps;Seated Long Arc Quad: AROM;Left;5 reps;Seated Knee Flexion: AROM;Left;5 reps;Standing Marching in Standing: AROM;Left;5 reps;Standing    General Comments        Pertinent Vitals/Pain Pain Assessment: 0-10 Pain Score: 5  Pain Location: L hip Pain Descriptors / Indicators: Sore;Discomfort Pain Intervention(s): Limited activity within patient's tolerance;Monitored during session;Premedicated before session;Repositioned    Home Living Family/patient expects to be discharged to:: Private residence Living Arrangements: Alone Available Help at Discharge: Family;Available 24 hours/day(sister, cousin to stay with pt for as long as needed) Type of  Home: House Home Access: Stairs to enter Entrance Stairs-Rails: None Home Layout: One level Home Equipment: Environmental consultant - 4 wheels;Bedside commode;Walker - 2 wheels      Prior Function Level of Independence: Independent      Comments:  pt currently unemployed due to [place] (current goals can now be found in the care plan section) Acute Rehab PT Goals Patient Stated Goal: go home ASAP PT Goal Formulation: With patient Time For Goal Achievement: 09/18/19 Potential to Achieve Goals: Good Progress towards PT goals: Progressing toward goals    Frequency    7X/week      PT Plan Current plan remains appropriate    Co-evaluation              AM-PAC PT "6 Clicks" Mobility   Outcome Measure  Help needed turning from your back to your side while in a flat bed without using bedrails?: None Help needed moving from lying on your back to sitting on the side of a flat bed without using bedrails?: None Help needed moving to and from a bed to a chair (including a wheelchair)?: None Help needed standing up from a chair using your arms (e.g., wheelchair or bedside chair)?: None Help needed to walk in hospital room?: A Little Help needed climbing 3-5 steps with a railing? : A Little 6 Click Score: 22    End of Session Equipment Utilized During Treatment: Gait belt Activity Tolerance: Patient limited by pain;Patient tolerated treatment well Patient left: in chair;with call bell/phone within reach(pt verbally agrees to press call button and wait for assist prior to mobilizing) Nurse Communication: Mobility status PT Visit Diagnosis: Other abnormalities of gait and mobility (R26.89);Difficulty in walking, not elsewhere classified (R26.2)     Time: [id number] PT Time Calculation (min) (ACUTE ONLY): 21 min  Charges:  $Gait Training: 8-22 mins                     Julien Girt, PT Acute Rehabilitation Services Pager [phone]  Office [phone]   [name] 09/11/2019, 12:57 PM

## 2019-09-11 NOTE — Progress Notes (Signed)
Subjective: 1 Day Post-Op Procedure(s) (LRB): LEFT TOTAL HIP ARTHROPLASTY ANTERIOR APPROACH (Left)   Patient feels much better this morning. She is hoping to move well with PT today and go home today.  Activity level:  wbat Diet tolerance:  ok Voiding:  Foley out this morning Patient reports pain as mild.    Objective: Vital signs in last 24 hours: Temp:  [97.4 F (36.3 C)-97.9 F (36.6 C)] 97.9 F (36.6 C) (10/28 0535) Pulse Rate:  [51-73] 72 (10/28 0535) Resp:  [10-18] 16 (10/28 0535) BP: (95-146)/(58-80) 132/67 (10/28 0535) SpO2:  [93 %-100 %] 99 % (10/28 0535)  Labs: Recent Labs    09/11/19 0517  HGB 11.1*   Recent Labs    09/11/19 0517  WBC 12.6*  RBC 3.63*  HCT 34.5*  PLT 160   Recent Labs    09/11/19 0517  NA 137  K 3.9  CL 102  CO2 27  BUN 10  CREATININE 0.55  GLUCOSE 128*  CALCIUM 9.7   Recent Labs    09/10/19 0605 09/10/19 0946  INR 1.0 1.1    Physical Exam:  Neurologically intact ABD soft Neurovascular intact Sensation intact distally Intact pulses distally Dorsiflexion/Plantar flexion intact Incision: dressing C/D/I and no drainage No cellulitis present Compartment soft  Assessment/Plan:  1 Day Post-Op Procedure(s) (LRB): LEFT TOTAL HIP ARTHROPLASTY ANTERIOR APPROACH (Left) Advance diet Up with therapy D/C IV fluids Discharge home with home health today after PT. Continue on home Xarelto  Follow up in office 2 weeks post op.  [name] Emelio Schneller 09/11/2019, 7:27 AM

## 2019-09-11 NOTE — Evaluation (Signed)
Physical Therapy Evaluation Patient Details Name: Michele Morrison MRN: [id number] DOB: 07-Apr-1956 Today's Date: 09/11/2019   History of Present Illness  63 yo female s/p L DA-THA on 09/11/19. PMH includes R TKR, OA, DVT, HTN, HCC (clotting disorder), HLD, pre-DM.  Clinical Impression  Pt presents with L hip pain, post-surgical LLE weakness, difficulty performing mobility tasks, and decreased activity tolerance due to pain. Pt to benefit from acute PT to address deficits. Pt ambulated 75 ft with min guard assist, requiring verbal cuing for form and safety. Pt educated on ankle pumps (20/hour) and quad sets for circulation and as a part of her HEP. PT to progress mobility as tolerated, and will continue to follow acutely.        Follow Up Recommendations Follow surgeon's recommendation for DC plan and follow-up therapies;Supervision for mobility/OOB(HEP)    Equipment Recommendations  None recommended by PT    Recommendations for Other Services       Precautions / Restrictions Precautions Precautions: Fall Restrictions Weight Bearing Restrictions: No Other Position/Activity Restrictions: WBAT      Mobility  Bed Mobility Overal bed mobility: Needs Assistance Bed Mobility: Supine to Sit     Supine to sit: Min assist     General bed mobility comments: min assist for LLE lifting and translation to EOB, increased time and effort.  Transfers Overall transfer level: Needs assistance Equipment used: Rolling walker (2 wheeled) Transfers: Sit to/from Stand Sit to Stand: Min guard         General transfer comment: min guard for safety, verbal cuing for hand placement when rising/sitting.  Ambulation/Gait Ambulation/Gait assistance: Min guard Gait Distance (Feet): 75 Feet Assistive device: Rolling walker (2 wheeled) Gait Pattern/deviations: Decreased stride length;Step-through pattern;Step-to pattern;Trunk flexed;Antalgic Gait velocity: decr   General Gait Details: min  guard for safety, verbal cuing for sequencing, placement in RW, upright posture.  Stairs            Wheelchair Mobility    Modified Rankin (Stroke Patients Only)       Balance Overall balance assessment: Mild deficits observed, not formally tested                                           Pertinent Vitals/Pain Pain Assessment: 0-10 Pain Score: 3  Pain Descriptors / Indicators: Sore;Discomfort Pain Intervention(s): Limited activity within patient's tolerance;Monitored during session;Repositioned;Premedicated before session;Ice applied    Home Living Family/patient expects to be discharged to:: Private residence Living Arrangements: Alone Available Help at Discharge: Family;Available 24 hours/day(sister, cousin to stay with pt for as long as needed) Type of Home: House Home Access: Stairs to enter Entrance Stairs-Rails: None Entrance Stairs-Number of Steps: 1 (threshold) Home Layout: One level Home Equipment: [place] - 4 wheels;Bedside commode;Walker - 2 wheels      Prior Function Level of Independence: Independent         Comments: pt currently unemployed due to [place]: Right    Extremity/Trunk Assessment   Upper Extremity Assessment Upper Extremity Assessment: Overall WFL for tasks assessed    Lower Extremity Assessment Lower Extremity Assessment: Generalized weakness;LLE deficits/detail LLE Deficits / Details: suspected post-surgical weakness; able to perform ankle pumps, strong quad set, assisted heel slide    Cervical / Trunk Assessment Cervical / Trunk Assessment: Normal  Communication   Communication: No difficulties  Cognition Arousal/Alertness:  Awake/alert Behavior During Therapy: WFL for tasks assessed/performed Overall Cognitive Status: Within Functional Limits for tasks assessed                                        General Comments      Exercises Total  Joint Exercises Ankle Circles/Pumps: AROM;Both;10 reps;Seated Quad Sets: AROM;Both;10 reps;Seated   Assessment/Plan    PT Assessment Patient needs continued PT services  PT Problem List Decreased mobility;Decreased strength;Decreased range of motion;Decreased activity tolerance;Decreased balance;Decreased knowledge of use of DME;Pain;Decreased safety awareness       PT Treatment Interventions DME instruction;Therapeutic activities;Gait training;Therapeutic exercise;Patient/family education;Balance training;Stair training;Functional mobility training    PT Goals (Current goals can be found in the Care Plan section)  Acute Rehab PT Goals Patient Stated Goal: go home ASAP PT Goal Formulation: With patient Time For Goal Achievement: 09/18/19 Potential to Achieve Goals: Good    Frequency 7X/week   Barriers to discharge        Co-evaluation               AM-PAC PT "6 Clicks" Mobility  Outcome Measure Help needed turning from your back to your side while in a flat bed without using bedrails?: A Little Help needed moving from lying on your back to sitting on the side of a flat bed without using bedrails?: A Little Help needed moving to and from a bed to a chair (including a wheelchair)?: A Little Help needed standing up from a chair using your arms (e.g., wheelchair or bedside chair)?: A Little Help needed to walk in hospital room?: A Little Help needed climbing 3-5 steps with a railing? : A Lot 6 Click Score: 17    End of Session Equipment Utilized During Treatment: Gait belt Activity Tolerance: Patient limited by fatigue;Patient limited by pain Patient left: in chair;with call bell/phone within reach(pt verbally agrees to press call button and wait for assist prior to mobilizing) Nurse Communication: Mobility status PT Visit Diagnosis: Other abnormalities of gait and mobility (R26.89);Difficulty in walking, not elsewhere classified (R26.2)    Time: [id number] PT Time  Calculation (min) (ACUTE ONLY): 24 min   Charges:   PT Evaluation $PT Eval Low Complexity: 1 Low PT Treatments $Gait Training: 8-22 mins        Julien Girt, PT Acute Rehabilitation Services Pager [phone]  Office [phone]   [name] 09/11/2019, 12:34 PM

## 2019-10-02 ENCOUNTER — Other Ambulatory Visit: Payer: Self-pay

## 2019-10-02 DIAGNOSIS — Z20822 Contact with and (suspected) exposure to covid-19: Secondary | ICD-10-CM

## 2019-10-04 LAB — NOVEL CORONAVIRUS, NAA: SARS-CoV-2, NAA: DETECTED — AB

## 2019-10-14 ENCOUNTER — Telehealth: Payer: Self-pay

## 2019-10-14 ENCOUNTER — Other Ambulatory Visit: Payer: Self-pay | Admitting: Family Medicine

## 2019-10-14 MED ORDER — FLUCONAZOLE 150 MG PO TABS: 150.0000 mg | ORAL_TABLET | Freq: Once | ORAL | 0 refills | Status: AC

## 2019-10-14 NOTE — Progress Notes (Signed)
Called patient per her request to talk to her about her symptoms of vaginal candidiasis.  Patient states that since Friday she has had symptoms of vaginal itching with a milky discharge similar to previous bouts of vaginal candidiasis that she has had in the past.  Unfortunately patient is currently infected with COVID-19 and is under quarantine.  Patient states that she has a family member that can assist her in getting her prescription and requested having something called in to her pharmacy.  I will prescribe 150 mg single dose of Diflucan.  Patient plans to call back if her symptoms do not improve in the next 2-3 days.  Also informed patient she is always able to make a virtual appointment if needed while under quarantine for COVID-19.

## 2019-10-14 NOTE — Telephone Encounter (Signed)
Pt calling because she know she has a yeast infection. Pt is under quarantine and is unemployed. Pt is asking for medication to be called into the pharmacy. Please call pt on [phone]. Ottis Stain, CMA

## 2019-10-14 NOTE — Progress Notes (Signed)
See previous orders-only note

## 2019-10-17 ENCOUNTER — Other Ambulatory Visit: Payer: Self-pay

## 2019-10-17 DIAGNOSIS — Z20822 Contact with and (suspected) exposure to covid-19: Secondary | ICD-10-CM

## 2019-10-21 LAB — NOVEL CORONAVIRUS, NAA: SARS-CoV-2, NAA: DETECTED — AB

## 2019-10-25 ENCOUNTER — Other Ambulatory Visit: Payer: Self-pay | Admitting: Family Medicine

## 2019-10-25 MED ORDER — FLUCONAZOLE 150 MG PO TABS: 150.0000 mg | ORAL_TABLET | ORAL | 0 refills | Status: AC

## 2019-10-25 NOTE — Telephone Encounter (Signed)
Prescription for 2 tablets sent to pharmacy for fluconazole.

## 2019-10-25 NOTE — Telephone Encounter (Signed)
Pt states that the one pill of fluconazole did not help (states she normally needs 2).  She c/o thick white discharge and itching.  She is requesting another round of fluconazole since she is still in quarantine.  Christen Bame, CMA

## 2019-10-25 NOTE — Progress Notes (Signed)
2 tablets of fluconazole ordered.

## 2019-10-25 NOTE — Telephone Encounter (Signed)
Pt informed.  Aven Christen, CMA  

## 2019-10-29 ENCOUNTER — Telehealth: Payer: Self-pay

## 2019-10-29 ENCOUNTER — Telehealth: Payer: Self-pay | Admitting: Family Medicine

## 2019-10-29 NOTE — Telephone Encounter (Signed)
Pt LVM on nurse line. Still having problems with yeast infection. Please advise as pt is still under quarantine. Pt can be reached at [phone]. Ottis Stain, CMA

## 2019-10-29 NOTE — Telephone Encounter (Signed)
Spoke with patient regarding her ongoing vaginal discharge symptoms.  Patient has been treated with fluconazole x1, with a repeat treatment of fluconazole x2 doses without relief.  Patient is still under quarantine from being retested for COVID-19 and showing positive.  After speaking with our multiple attendings it was determined with the current positive COVID-19 test the patient is unable to be seen here.  Patient understands that she needs a physical exam performed to determine the cause of her symptoms.  I spoke to the patient and recommended her contacting a local urgent care as I was informed that some of those are seen Covid positive patients for other conditions.  Patient states this is acceptable and plans to call the urgent care and inform them of her previous 2 Covid positive tests as well as her symptoms and see if she can be seen and have an exam performed on her due to her ongoing vaginal discharge symptoms.  Patient plans to call back with any other questions or concerns as needed.

## 2019-10-30 ENCOUNTER — Other Ambulatory Visit: Payer: Self-pay

## 2019-10-30 ENCOUNTER — Ambulatory Visit (HOSPITAL_COMMUNITY)
Admission: EM | Admit: 2019-10-30 | Discharge: 2019-10-30 | Disposition: A | Discharge status: Home / Self Care | Payer: No Typology Code available for payment source | Attending: Family Medicine | Admitting: Family Medicine

## 2019-10-30 ENCOUNTER — Encounter (HOSPITAL_COMMUNITY): Payer: Self-pay | Admitting: Urgent Care

## 2019-10-30 DIAGNOSIS — N898 Other specified noninflammatory disorders of vagina: Secondary | ICD-10-CM | POA: Diagnosis not present

## 2019-10-30 MED ORDER — METRONIDAZOLE 500 MG PO TABS: 500.0000 mg | ORAL_TABLET | Freq: Two times a day (BID) | ORAL | 0 refills | Status: DC

## 2019-10-30 NOTE — Discharge Instructions (Addendum)
Take the medication as prescribed for bacterial vaginosis.  Only use unscented soaps in the vaginal area.  Follow up as needed for continued or worsening symptoms

## 2019-10-30 NOTE — ED Provider Notes (Signed)
Patient requested female provider after I was able to clarify that she actually has vaginal itching.    Michele Morrison, [place] 10/30/19 [phone]

## 2019-10-30 NOTE — ED Triage Notes (Signed)
Pt presents with itchiness all over her body for about 7 days; pt states she was previously treated for a yeast infection about 2 weeks ago.

## 2019-10-30 NOTE — ED Provider Notes (Signed)
[facility]    CSN: [id number] Arrival date & time: 10/30/19  0802      History   Chief Complaint Chief Complaint  Patient presents with  . Itchiness    HPI Michele Morrison is a 63 y.o. female.   Patient is a 63 year old female presents today with vaginal itching, irritation, milky white discharge.  Symptoms have been constant and worsening with past 7 days.  She has been treated x3 doses of fluconazole with no relief.  Using antiitch cream.  Denies associated abdominal pain, back pain, fevers, dysuria, hematuria urinary frequency.  She is not currently sexually active and there is no concern for STDs.  ROS per HPI      Past Medical History:  Diagnosis Date  . Arthritis    thumbs,spine  . Clotting disorder ([place])    dvt-1996, after being placed on estrogen,after shoulder surgery 2007  . Complication of anesthesia 12/2018   awareness during surgery  . Diverticulosis   . H/O blood clots 1996   after a 14 hour plane flight  . History of kidney stones   . HTN (hypertension)   . Hyperlipidemia   . Kidney stones   . PONV (postoperative nausea and vomiting)   . Pre-diabetes     Patient Active Problem List   Diagnosis Date Noted  . Primary localized osteoarthritis of left hip 09/10/2019  . Primary osteoarthritis of left hip 09/10/2019  . Preop cardiovascular exam 09/02/2019  . Hip pain 08/23/2019  . Primary localized osteoarthritis of right knee 12/25/2018  . Primary osteoarthritis of right knee 12/25/2018  . Vaginal yeast infection 12/11/2018  . Acute bronchitis 04/25/2017  . Preventative health care 12/21/2015  . Ulcer of ankle ([place]) 05/27/2014  . Other malaise and fatigue 01/01/2014  . Long term (current) use of anticoagulants 12/25/2010  . ESSENTIAL HYPERTENSION, BENIGN 05/03/2010  . OBESITY 02/07/2008    Past Surgical History:  Procedure Laterality Date  . COLONOSCOPY    . JOINT REPLACEMENT Bilateral   . ROTATOR CUFF REPAIR Right   . TOTAL  HIP ARTHROPLASTY Left 09/10/2019   Procedure: LEFT TOTAL HIP ARTHROPLASTY ANTERIOR APPROACH;  Surgeon: Melrose Nakayama, MD;  Location: [facility];  Service: Orthopedics;  Laterality: Left;  . TOTAL KNEE ARTHROPLASTY Left 2012  . TOTAL KNEE ARTHROPLASTY Right 12/25/2018   Procedure: TOTAL KNEE ARTHROPLASTY;  Surgeon: Melrose Nakayama, MD;  Location: [facility];  Service: Orthopedics;  Laterality: Right;    OB History   No obstetric history on file.      Home Medications    Prior to Admission medications   Medication Sig Start Date End Date Taking? Authorizing Provider  acetaminophen (TYLENOL) 500 MG tablet Take 500 mg by mouth every 6 (six) hours as needed for moderate pain.    [provider]  hydrochlorothiazide (HYDRODIURIL) 25 MG tablet Take 1 tablet (25 mg total) by mouth daily. Patient taking differently: Take 25 mg by mouth every evening.  07/15/19   Lurline Del, DO  meclizine (ANTIVERT) 25 MG tablet Take 25 mg by mouth every 8 (eight) hours as needed for dizziness. 03/11/19   [provider]  metroNIDAZOLE (FLAGYL) 500 MG tablet Take 1 tablet (500 mg total) by mouth 2 (two) times daily. 10/30/19   Loura Halt A, NP  rivaroxaban (XARELTO) 20 MG TABS tablet TAKE 1 TABLET BY MOUTH EVERY DAY WITH SUPPER Patient taking differently: Take 20 mg by mouth every evening.  07/15/19   Lurline Del, DO  tiZANidine (ZANAFLEX) 4  MG tablet Take 1 tablet (4 mg total) by mouth every 6 (six) hours as needed. 09/11/19 09/10/20  Loni Dolly, PA-C    Family History Family History  Problem Relation Age of Onset  . Colon cancer Mother 56  . Coronary artery disease Mother   . Heart disease Mother   . Cancer Father        Non-Hodgkin's lymphoma  . Coronary artery disease Father   . Heart disease Father   . Stomach cancer Paternal Grandfather        pt unsure  . Esophageal cancer Neg Hx   . Rectal cancer Neg Hx     Social History Social History   Tobacco Use  . Smoking status:  Never Smoker  . Smokeless tobacco: Never Used  Substance Use Topics  . Alcohol use: Yes    Alcohol/week: 0.0 standard drinks    Comment: rarely  . Drug use: Yes    Types: Other-see comments     Allergies   Latex, Cephalexin, Vancomycin, Neosporin [bacitracin-polymyxin b], Other, and Penicillins   Review of Systems Review of Systems   Physical Exam Triage Vital Signs ED Triage Vitals  Enc Vitals Group     BP 10/30/19 0823 (!) 154/88     Pulse Rate 10/30/19 0823 76     Resp 10/30/19 0823 16     Temp 10/30/19 0823 98.2 F (36.8 C)     Temp Source 10/30/19 0823 Oral     SpO2 10/30/19 0823 95 %     Weight --      Height --      Head Circumference --      Peak Flow --      Pain Score 10/30/19 0825 0     Pain Loc --      Pain Edu? --      Excl. in [place]? --    No data found.  Updated Vital Signs BP (!) 154/88 (BP Location: Right Arm)   Pulse 76   Temp 98.2 F (36.8 C) (Oral)   Resp 16   SpO2 95%   Visual Acuity Right Eye Distance:   Left Eye Distance:   Bilateral Distance:    Right Eye Near:   Left Eye Near:    Bilateral Near:     Physical Exam Vitals and nursing note reviewed.  Constitutional:      General: She is not in acute distress.    Appearance: Normal appearance. She is not ill-appearing, toxic-appearing or diaphoretic.  HENT:     Head: Normocephalic.     Nose: Nose normal.     Mouth/Throat:     Pharynx: Oropharynx is clear.  Eyes:     Conjunctiva/sclera: Conjunctivae normal.  Pulmonary:     Effort: Pulmonary effort is normal.  Abdominal:     Palpations: Abdomen is soft.     Tenderness: There is no abdominal tenderness.  Musculoskeletal:        General: Normal range of motion.     Cervical back: Normal range of motion.  Skin:    General: Skin is warm and dry.     Findings: No rash.  Neurological:     Mental Status: She is alert.  Psychiatric:        Mood and Affect: Mood normal.      UC Treatments / Results  Labs (all labs  ordered are listed, but only abnormal results are displayed) Labs Reviewed  CERVICOVAGINAL ANCILLARY ONLY    EKG   Radiology No  results found.  Procedures Procedures (including critical care time)  Medications Ordered in UC Medications - No data to display  Initial Impression / Assessment and Plan / UC Course  I have reviewed the triage vital signs and the nursing notes.  Pertinent labs & imaging results that were available during my care of the patient were reviewed by me and considered in my medical decision making (see chart for details).     Vaginal discharge and vaginal irritation-patient has already been treated for yeast with no relief.  Based on discharge and symptoms we will treat for bacterial vaginosis Swab sent for testing. Final Clinical Impressions(s) / UC Diagnoses   Final diagnoses:  Vaginal discharge     Discharge Instructions     Take the medication as prescribed for bacterial vaginosis.  Only use unscented soaps in the vaginal area.  Follow up as needed for continued or worsening symptoms     ED Prescriptions    Medication Sig Dispense Auth. Provider   metroNIDAZOLE (FLAGYL) 500 MG tablet Take 1 tablet (500 mg total) by mouth 2 (two) times daily. 14 tablet Edi Gorniak A, NP     PDMP not reviewed this encounter.   Orvan July, NP 10/30/19 [phone]

## 2019-10-31 ENCOUNTER — Telehealth: Payer: Self-pay | Admitting: *Deleted

## 2019-10-31 NOTE — Telephone Encounter (Signed)
Pts new insurance will not cover xarelto, it will cost her $400 / month.  She has called them but "they were not helpful"  She is unable to afford that.  Her new insurance is [employer] [facility] [phone]).

## 2019-10-31 NOTE — Telephone Encounter (Signed)
Contacted patient, [employer], pharmacy and then patient again.   Patients had a change in insurance 1-2 months ago which resulted in loss of coverage for xarelto - (rivaroxaban)  Patient assistance through the insurance company may be available as the patient is currently uninsured and the income limit is $38,000 per year for assistance.  She can call [phone].  Patient was contacted and informed to come to our office to pick-up a supply of  Drug name: Xarelto       Strength: 20mg         Qty: 49 tabs  LOT: 19MG [id number] Exp.Date: 08/13/2021  Dosing instructions: 1 daily with supper  The patient has been instructed regarding the correct time, dose, and frequency of taking this medication, including desired effects and most common side effects.  Patient denies any bruising/bleeding.   Michele Morrison 12:54 PM 10/31/2019  Medication samples, 30 day free card and discount drug plan card (set up on-line) were also provided.  I will be happy to assist if additional supply is needed for this patient prior to her 64th birthday in 09/2021.  She was appreciative of the assistance.

## 2019-11-01 ENCOUNTER — Telehealth (HOSPITAL_COMMUNITY): Payer: Self-pay | Admitting: Emergency Medicine

## 2019-11-01 LAB — CERVICOVAGINAL ANCILLARY ONLY
Bacterial vaginitis: NEGATIVE
Candida vaginitis: POSITIVE — AB
Chlamydia: NEGATIVE
Neisseria Gonorrhea: NEGATIVE
Trichomonas: NEGATIVE

## 2019-11-01 MED ORDER — TERCONAZOLE 0.4 % VA CREA: 1.0000 | TOPICAL_CREAM | Freq: Every day | VAGINAL | 0 refills | Status: AC

## 2019-11-01 NOTE — Telephone Encounter (Signed)
Reviewed results with Loura Halt, pt has had several doses of diflucan without relief, pt may benefit from an insert, terconazole. Attempted to reach patient to discuss, no answer, left voicemail to return call.

## 2019-11-01 NOTE — Telephone Encounter (Signed)
Patient contacted by phone and made aware of    results. Pt verbalized understanding and had all questions answered.  Will send terconazole. Pt will stop flagyl.

## 2019-11-11 NOTE — Telephone Encounter (Signed)
Noted and agree. 

## 2019-11-14 ENCOUNTER — Other Ambulatory Visit: Payer: Self-pay

## 2019-11-18 ENCOUNTER — Encounter: Payer: Self-pay | Admitting: Certified Nurse Midwife

## 2019-11-18 ENCOUNTER — Other Ambulatory Visit: Payer: Self-pay

## 2019-11-18 ENCOUNTER — Ambulatory Visit (INDEPENDENT_AMBULATORY_CARE_PROVIDER_SITE_OTHER): Payer: No Typology Code available for payment source | Admitting: Certified Nurse Midwife

## 2019-11-18 VITALS — Temp 98.7°F | Ht 64.25 in | Wt 235.0 lb

## 2019-11-18 DIAGNOSIS — N762 Acute vulvitis: Secondary | ICD-10-CM

## 2019-11-18 DIAGNOSIS — Z8619 Personal history of other infectious and parasitic diseases: Secondary | ICD-10-CM | POA: Diagnosis not present

## 2019-11-18 NOTE — Patient Instructions (Addendum)
Aveeno oatmeal bath to use in sitz bath. Purchase Z Zorb powder to help with moisture  Atrophic Vaginitis Atrophic vaginitis is a condition in which the tissues that line the vagina become dry and thin. This condition occurs in women who have stopped having their period. It is caused by a drop in a female hormone (estrogen). This hormone helps:  To keep the vagina moist.  To make a clear fluid. This clear fluid helps: ? To make the vagina ready for sex. ? To protect the vagina from infection. If the lining of the vagina is dry and thin, it may cause irritation, burning, or itchiness. It may also:  Make sex painful.  Make an exam of your vagina painful.  Cause bleeding.  Make you lose interest in sex.  Cause a burning feeling when you pee (urinate).  Cause a brown or yellow fluid to come from your vagina. Some women do not have symptoms. Follow these instructions at home: Medicines  Take over-the-counter and prescription medicines only as told by your doctor.  Do not use herbs or other medicines unless your doctor says it is okay.  Use medicines for for dryness. These include: ? Oils to make the vagina soft. ? Creams. ? Moisturizers. General instructions  Do not douche.  Do not use products that can make your vagina dry. These include: ? Scented sprays. ? Scented tampons. ? Scented soaps.  Sex can help increase blood flow and soften the tissue in the vagina. If it hurts to have sex: ? Tell your partner. ? Use products to make sex more comfortable. Use these only as told by your doctor. Contact a doctor if you:  Have discharge from the vagina that is different than usual.  Have a bad smell coming from your vagina.  Have new symptoms.  Do not get better.  Get worse. Summary  Atrophic vaginitis is a condition in which the lining of the vagina becomes dry and thin.  This condition affects women who have stopped having their periods.  Treatment may include  using products that help make the vagina soft.  Call a doctor if do not get better with treatment. This information is not intended to replace advice given to you by your health care provider. Make sure you discuss any questions you have with your health care provider. Document Revised: 11/13/2017 Document Reviewed: 11/13/2017 Elsevier Patient Education  2020 [place].    Vaginal Yeast Infection, Adult  Vaginal yeast infection is a condition that causes vaginal discharge as well as soreness, swelling, and redness (inflammation) of the vagina. This is a common condition. Some women get this infection frequently. What are the causes? This condition is caused by a change in the normal balance of the yeast (candida) and bacteria that live in the vagina. This change causes an overgrowth of yeast, which causes the inflammation. What increases the risk? The condition is more likely to develop in women who:  Take antibiotic medicines.  Have diabetes.  Take birth control pills.  Are pregnant.  Douche often.  Have a weak body defense system (immune system).  Have been taking steroid medicines for a long time.  Frequently wear tight clothing. What are the signs or symptoms? Symptoms of this condition include:  White, thick, creamy vaginal discharge.  Swelling, itching, redness, and irritation of the vagina. The lips of the vagina (vulva) may be affected as well.  Pain or a burning feeling while urinating.  Pain during sex. How is this diagnosed? This  condition is diagnosed based on:  Your medical history.  A physical exam.  A pelvic exam. Your health care provider will examine a sample of your vaginal discharge under a microscope. Your health care provider may send this sample for testing to confirm the diagnosis. How is this treated? This condition is treated with medicine. Medicines may be over-the-counter or prescription. You may be told to use one or more of the  following:  Medicine that is taken by mouth (orally).  Medicine that is applied as a cream (topically).  Medicine that is inserted directly into the vagina (suppository). Follow these instructions at home:  Lifestyle  Do not have sex until your health care provider approves. Tell your sex partner that you have a yeast infection. That person should go to his or her health care provider and ask if they should also be treated.  Do not wear tight clothes, such as pantyhose or tight pants.  Wear breathable cotton underwear. General instructions  Take or apply over-the-counter and prescription medicines only as told by your health care provider.  Eat more yogurt. This may help to keep your yeast infection from returning.  Do not use tampons until your health care provider approves.  Try taking a sitz bath to help with discomfort. This is a warm water bath that is taken while you are sitting down. The water should only come up to your hips and should cover your buttocks. Do this 3-4 times per day or as told by your health care provider.  Do not douche.  If you have diabetes, keep your blood sugar levels under control.  Keep all follow-up visits as told by your health care provider. This is important. Contact a health care provider if:  You have a fever.  Your symptoms go away and then return.  Your symptoms do not get better with treatment.  Your symptoms get worse.  You have new symptoms.  You develop blisters in or around your vagina.  You have blood coming from your vagina and it is not your menstrual period.  You develop pain in your abdomen. Summary  Vaginal yeast infection is a condition that causes discharge as well as soreness, swelling, and redness (inflammation) of the vagina.  This condition is treated with medicine. Medicines may be over-the-counter or prescription.  Take or apply over-the-counter and prescription medicines only as told by your health care  provider.  Do not douche. Do not have sex or use tampons until your health care provider approves.  Contact a health care provider if your symptoms do not get better with treatment or your symptoms go away and then return. This information is not intended to replace advice given to you by your health care provider. Make sure you discuss any questions you have with your health care provider. Document Revised: 05/31/2019 Document Reviewed: 03/19/2018 Elsevier Patient Education  [place].

## 2019-11-18 NOTE — Progress Notes (Signed)
64 y.o. G0P0000 Single  Caucasian Fe here to establish gyn care, for problem of vaginal itching only. Post menopausal, previous HRT use with DVT. Has not had gyn exam in several years and declines today. She is here for chronic vaginal itching and rash in pubic and vaginal area. Complaining of severe vaginal itching and tenderness, in vaginal area and under abdominal flap and in groin area at times. She has been applying anti-itch cream with benadryl to areas. She has  been given by PCP Terazole use x 7 days and Diflucan x 3 in the past. Also was treated for BV with Flagyl with no change. Not sure if she has vaginal dryness. Some stress incontinence occasional with thin pad use. Patient is very frustrated and "Tired of this". Bathes with Dove sensitive skin soap, no real hot showers, no tub baths. No powder to area just every cream that she could use to stop the itching. Denies vaginal bleeding or dryness, that she is aware of. Recent left hip replacement and has been doing well with it. No other health issues today.   No LMP recorded. Patient is postmenopausal.          Sexually active: No.  The current method of family planning is post menopausal status.    Exercising: Yes.    therapy from surgery Smoker:  no  Review of Systems  Constitutional: Negative.   HENT: Negative.   Eyes: Negative.   Respiratory: Negative.   Cardiovascular: Negative.   Gastrointestinal: Negative.   Genitourinary: Negative.   Musculoskeletal: Negative.   Skin:       Vaginal itching, discharge & irritation for 80mth  Neurological: Negative.   Endo/Heme/Allergies: Negative.   Psychiatric/Behavioral: Negative.     Health Maintenance: Pap:  Few yrs ago per patient History of Abnormal Pap: no MMG:  2013 Self Breast exams: occ Colonoscopy:  2019 f/u 89yrs per patient BMD:   none TDaP:  2014 Shingles: not done Pneumonia: not done Hep C and HIV: not done Labs: if needed   reports that she has never smoked. She has  never used smokeless tobacco. She reports previous alcohol use. She reports that she does not use drugs.  Past Medical History:  Diagnosis Date  . Arthritis    thumbs,spine  . 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 Complication of anesthesia 12/2018   awareness during surgery  . Diverticulosis   . H/O blood clots 1996   after a 14 hour plane flight  . History of kidney stones   . HTN (hypertension)   . Hyperlipidemia   . Kidney stones   . PONV (postoperative nausea and vomiting)   . Pre-diabetes     Past Surgical History:  Procedure Laterality Date  . COLONOSCOPY    . JOINT REPLACEMENT Bilateral   . ROTATOR CUFF REPAIR Right   . TOTAL HIP ARTHROPLASTY Left 09/10/2019   Procedure: LEFT TOTAL HIP ARTHROPLASTY ANTERIOR APPROACH;  Surgeon: Melrose Nakayama, MD;  Location: [facility];  Service: Orthopedics;  Laterality: Left;  . TOTAL KNEE ARTHROPLASTY Left 2012  . TOTAL KNEE ARTHROPLASTY Right 12/25/2018   Procedure: TOTAL KNEE ARTHROPLASTY;  Surgeon: Melrose Nakayama, MD;  Location: [facility];  Service: Orthopedics;  Laterality: Right;    Current Outpatient Medications  Medication Sig Dispense Refill  . hydrochlorothiazide (HYDRODIURIL) 25 MG tablet Take 1 tablet (25 mg total) by mouth daily. (Patient taking differently: Take 25 mg by mouth every evening. )  90 tablet 1  . rivaroxaban (XARELTO) 20 MG TABS tablet TAKE 1 TABLET BY MOUTH EVERY DAY WITH SUPPER (Patient taking differently: Take 20 mg by mouth every evening. ) 90 tablet 2   No current facility-administered medications for this visit.    Family History  Problem Relation Age of Onset  . Colon cancer Mother 4  . Coronary artery disease Mother   . Heart disease Mother   . Stroke Mother   . Thyroid cancer Mother   . Cancer Father        Non-Hodgkin's lymphoma  . Coronary artery disease Father   . Heart disease Father   . Stroke Father     ROS:  Pertinent items  are noted in HPI.  Otherwise, a comprehensive ROS was negative.  Exam:   Temp 98.7 F (37.1 C) (Skin)   Ht 5' 4.25" (1.632 m)   Wt 235 lb (106.6 kg)   BMI 40.02 kg/m  Height: 5' 4.25" (163.2 cm) Ht Readings from Last 3 Encounters:  11/18/19 5' 4.25" (1.632 m)  09/10/19 5\' 6"  (1.676 m)  09/05/19 5\' 6"  (1.676 m)    General appearance: alert, cooperative and appears stated age Head: Normocephalic, without obvious abnormality, atraumatic Abdomen: soft, non-tender; no masses, Skin: Skin color, texture, turgor normal. No rashes or lesio Under abdominal flap fine pink rash noted with applied cream noted on the area, no weeping or exudate. Lymph nodes: No abnormal inguinal nodes palpated Neurologic: Grossly normal  Pelvic: External genitalia:  no lesions, fine pink rash in groin area, no exudate or weeping, scaling noted, Wet prep taken              Urethra:  normal appearing urethra with no masses, tenderness or lesions              Bartholin's and Skene's: normal                 Vagina: normal appearing vagina with pale color and  Scant white discharge, no lesions, affirm taken              Cervix: no cervical motion tenderness, no lesions and normal appearance              Pap taken: No. declined Bimanual Exam:  Uterus:  Normal size, shape, non tender, mid position              Adnexa: no masses or tenderness, normal adnexa               Rectovaginal: normal               Anus:  Declined, slight redness around rectum, no fine rash noted  Wet Prep: vulva area KOH, Saline negative for yeast  Chaperone present: yes  A:  Normal pelvic exam, needs pap smear  R/O vaginal infection  Poly medication use for urticaria in vaginal/groin area    P:   Reviewed findings with patient and discussed using Dove white bar soap to wash with . Discussed using Aveeno oatmeal bath and pour over irritated skin and pat dry to help with irritation. Stop her OTC anti itch cream. Start now with Aveeno Eczema  cream per OTC instructions. Sleep with loose or no underwear.   Will treat per Affirm results when in.   Lab: Affirm  Discussed she needs Gyn exam and pap smear. She plans to schedule once this has cleared. Obtain some Z Zorb powder OTC to help in groin area if moist.  Questions addressed.  Rv one week.  An After Visit Summary was printed and given to the patient.

## 2019-11-19 LAB — VAGINITIS/VAGINOSIS, DNA PROBE
Candida Species: NEGATIVE
Gardnerella vaginalis: NEGATIVE
Trichomonas vaginosis: NEGATIVE

## 2019-11-19 NOTE — Progress Notes (Signed)
Notify patient her vaginal screening did not show vaginal infection of yeast, BV or trichomonas. Discussed patient felt she was over using anti itch cream and this was the problem. Discussed using Aveeno Oatmeal sitz bath and Aveeno Eczema cream to treat area with . This is what she needs to work with at present, due to no infection. Do not keep putting anti itch cream on as this has caused some break down of skin. Patient status?

## 2019-11-25 NOTE — Progress Notes (Signed)
64 y.o. G0P0000 Single  Caucasian Fe here for annual exam.Post menopausal no HRT. Denies vaginal bleeding, some vaginal dryness. She is also here for follow up of acute vulvitis treating with Aveeno oatmeal sitz bath and Aveeno eczema cream Working well, all itching has resolved. Feels all redness is disappearing also. Sees  Dr. Jolinda Croak PCP for hypertension, labs. History of DVT in 1996 from estrogen use? Not sexually active. No other health issues today. Planning trip to [place] soon.  No LMP recorded. Patient is postmenopausal.          Sexually active: No. , never sexually active The current method of family planning is post menopausal status.    Exercising: Yes.    therapy from surgery occ Smoker:  no  Review of Systems  Constitutional: Negative.   HENT: Negative.   Eyes: Negative.   Respiratory: Negative.   Cardiovascular: Negative.   Gastrointestinal: Negative.   Genitourinary: Negative.   Musculoskeletal: Negative.   Skin:       Vaginal itching & irritation  Neurological: Negative.   Endo/Heme/Allergies: Negative.   Psychiatric/Behavioral: Negative.     Health Maintenance: Pap:  Few yrs ago per patient History of Abnormal Pap: no MMG:  2013 Self Breast exams: occ Colonoscopy:  2019 f/u 60yrs per patient BMD:   none TDaP:  2014 Shingles: not done Pneumonia: not done Hep C and HIV: not done Labs: if  needed   reports that she has never smoked. She has never used smokeless tobacco. She reports previous alcohol use. She reports that she does not use drugs.  Past Medical History:  Diagnosis Date  . Arthritis    thumbs,spine  . 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 Complication of anesthesia 12/2018   awareness during surgery  . Diverticulosis   . H/O blood clots 1996   after a 14 hour plane flight  . History of kidney stones   . HTN (hypertension)   . Hyperlipidemia   . Kidney stones   . PONV  (postoperative nausea and vomiting)   . Pre-diabetes     Past Surgical History:  Procedure Laterality Date  . COLONOSCOPY    . JOINT REPLACEMENT Bilateral   . ROTATOR CUFF REPAIR Right   . TOTAL HIP ARTHROPLASTY Left 09/10/2019   Procedure: LEFT TOTAL HIP ARTHROPLASTY ANTERIOR APPROACH;  Surgeon: Melrose Nakayama, MD;  Location[facility];  Service: Orthopedics;  Laterality: Left;  . TOTAL KNEE ARTHROPLASTY Left 2012  . TOTAL KNEE ARTHROPLASTY Right 12/25/2018   Procedure: TOTAL KNEE ARTHROPLASTY;  Surgeon: Melrose Nakayama, MD;  Location: [facility];  Service: Orthopedics;  Laterality: Right;    Current Outpatient Medications  Medication Sig Dispense Refill  . hydrochlorothiazide (HYDRODIURIL) 25 MG tablet Take 1 tablet (25 mg total) by mouth daily. (Patient taking differently: Take 25 mg by mouth every evening. ) 90 tablet 1  . rivaroxaban (XARELTO) 20 MG TABS tablet TAKE 1 TABLET BY MOUTH EVERY DAY WITH SUPPER (Patient taking differently: Take 20 mg by mouth every evening. ) 90 tablet 2   No current facility-administered medications for this visit.    Family History  Problem Relation Age of Onset  . Colon cancer Mother 59  . Coronary artery disease Mother   . Heart disease Mother   . Stroke Mother   . Thyroid cancer Mother   . Cancer Father        Non-Hodgkin's lymphoma  . Coronary artery disease Father   .  Heart disease Father   . Stroke Father     ROS:  Pertinent items are noted in HPI.  Otherwise, a comprehensive ROS was negative.  Exam:   BP 114/80   Pulse 70   Temp (!) 97.4 F (36.3 C) (Skin)   Resp 16   Ht 5' 4.25" (1.632 m)   Wt 237 lb (107.5 kg)   BMI 40.36 kg/m  Height: 5' 4.25" (163.2 cm) Ht Readings from Last 3 Encounters:  11/26/19 5' 4.25" (1.632 m)  11/18/19 5' 4.25" (1.632 m)  09/10/19 5\' 6"  (1.676 m)    General appearance: alert, cooperative and appears stated age Head: Normocephalic, without obvious abnormality, atraumatic Neck: no adenopathy,  supple, symmetrical, trachea midline and thyroid normal to inspection and palpation Lungs: clear to auscultation bilaterally Breasts: normal appearance, no masses or tenderness, No nipple retraction or dimpling, No nipple discharge or bleeding, No axillary or supraclavicular adenopathy, Taught monthly breast self examination, large pendulous bilateral Heart: regular rate and rhythm Abdomen: soft, non-tender; no masses,  no organomegaly Extremities: extremities normal, atraumatic, no cyanosis or edema Skin: Skin color, texture, turgor normal. No rashes or lesions Lymph nodes: Cervical, supraclavicular, and axillary nodes normal. No abnormal inguinal nodes palpated Neurologic: Grossly normal   Pelvic: External genitalia:  no lesions              Urethra:  normal appearing urethra with no masses, tenderness or lesions              Bartholin's and Skene's: normal                 Vagina: normal appearing vagina with normal color and discharge, no lesions, virginal opening              Cervix: no lesions, nulliparous appearance and difficult to visualize              Pap taken: Yes.    Bimanual Exam:  Uterus:  normal size, contour, position, consistency, mobility, non-tender and mid position              Adnexa: normal adnexa and no mass, fullness, tenderness               Rectovaginal: Confirms               Anus:  normal sphincter tone, no lesions  Chaperone present: yes  A:  Well Woman with normal exam  Post menopausal no HRT  Acute vulvitis follow up with resolution with stopping all products and using Aveeno Oatmeal bath  PCP management of Hypertension and Xarelto due to history of DVT with estrogen  P:   Reviewed health and wellness pertinent to exam.  Discussed importance of calling if vaginal bleeding.  Discussed continue plan for vaginal hygiene and if has reoccurrence to come in, not use any other products.  Continue follow up with PCP as indicated.  Pap smear: yes   counseled  on breast self exam, mammography screening, feminine hygiene, menopause, adequate intake of calcium and vitamin D  return annually or prn  An After Visit Summary was printed and given to the patient.

## 2019-11-26 ENCOUNTER — Other Ambulatory Visit: Payer: Self-pay

## 2019-11-26 ENCOUNTER — Encounter: Payer: Self-pay | Admitting: Certified Nurse Midwife

## 2019-11-26 ENCOUNTER — Ambulatory Visit (INDEPENDENT_AMBULATORY_CARE_PROVIDER_SITE_OTHER): Payer: No Typology Code available for payment source | Admitting: Certified Nurse Midwife

## 2019-11-26 ENCOUNTER — Other Ambulatory Visit (HOSPITAL_COMMUNITY)
Admission: RE | Admit: 2019-11-26 | Discharge: 2019-11-26 | Disposition: A | Discharge status: Home / Self Care | Payer: No Typology Code available for payment source | Source: Ambulatory Visit | Attending: Obstetrics & Gynecology | Admitting: Obstetrics & Gynecology

## 2019-11-26 VITALS — BP 114/80 | HR 70 | Temp 97.4°F | Resp 16 | Ht 64.25 in | Wt 237.0 lb

## 2019-11-26 DIAGNOSIS — Z01419 Encounter for gynecological examination (general) (routine) without abnormal findings: Secondary | ICD-10-CM

## 2019-11-26 DIAGNOSIS — Z124 Encounter for screening for malignant neoplasm of cervix: Secondary | ICD-10-CM

## 2019-11-26 NOTE — Patient Instructions (Signed)

## 2019-12-04 LAB — CYTOLOGY - PAP
Adequacy: ABSENT
Comment: NEGATIVE
Comment: NEGATIVE
Comment: NEGATIVE
Diagnosis: NEGATIVE
HPV 16: NEGATIVE
HPV 18 / 45: NEGATIVE
High risk HPV: POSITIVE — AB
High risk HPV: POSITIVE — AB

## 2019-12-05 ENCOUNTER — Telehealth: Payer: Self-pay | Admitting: *Deleted

## 2019-12-05 NOTE — Telephone Encounter (Signed)
Burnice Logan, RN  12/05/2019 1:18 PM EST    Left message to call Sharee Pimple, RN at [facility].   12 recall placed   Regina Eck, CNM  12/04/2019 4:49 PM EST    Notify patient her pap smear was positive for HPV, but negative for high risk HPV, 16,18,45. No further screening at this time. She needs repeat pap smear in one year. 12 recall

## 2019-12-19 NOTE — Telephone Encounter (Signed)
Left message to call Darey Hershberger, RN at [facility] [phone].   

## 2019-12-20 NOTE — Telephone Encounter (Signed)
Spoke with patient, advised of results as seen below per Melvia Heaps, CNM. Explanation of results provided, patient has additional questions about HPV. Recommended MyChart consult with Melvia Heaps, CNM to further discuss, patient agreeable. Offered 4:30pm appt today, patient declined. MyChart visit scheduled for 12/23/19 at 11:30am with Melvia Heaps, CNM.   Routing to provider for final review. Patient is agreeable to disposition. Will close encounter.

## 2019-12-23 ENCOUNTER — Telehealth (INDEPENDENT_AMBULATORY_CARE_PROVIDER_SITE_OTHER): Payer: No Typology Code available for payment source | Admitting: Certified Nurse Midwife

## 2019-12-23 DIAGNOSIS — R87618 Other abnormal cytological findings on specimens from cervix uteri: Secondary | ICD-10-CM

## 2019-12-23 DIAGNOSIS — R8789 Other abnormal findings in specimens from female genital organs: Secondary | ICD-10-CM | POA: Diagnosis not present
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
## 2019-12-23 NOTE — Patient Instructions (Signed)
HPV Test Why am I having this test? HPV (human papillomavirus) refers to a group of about 100 viruses. Many of these viruses cause growths on, in, or around the genitals. Most HPV viruses cause infections that usually go away without treatment.  The HPV test checks for high-risk types (strains) of HPV. Strains 16 and 18 are considered the most high-risk for cancer. If you have strain 16 or 18 HPV and it is not treated, it can increase your risk for cancer of the cervix, vagina, vulva, or anus. HPV can be found in both males and females. However, the HPV test is used to screen for increased cancer risk in females:  Who are 30-65 years old.  Who have an abnormal Pap test.  Who have been treated for an abnormal Pap test in the past.  Who have been treated for a high-risk HPV infection in the past. If you are a woman older than 30, you may have the HPV test at the same time as a pelvic exam and Pap test. What is being tested? This test checks for the DNA (genetic) strands of the HPV infection. This test is also called the HPV DNA test. What kind of sample is taken?  This test requires a sample of cells from the cervix. This will be done using a small cotton swab, plastic spatula, or brush. This sample is often collected during a pelvic exam, when you are lying on your back on an exam table with feet in footrests (stirrups). How do I prepare for this test?  Starting 24-48 hours before your test, or as told by your health care provider, do not: ? Take a bath. ? Have sex. ? Douche.  Schedule the test for a day when you are not menstruating. If you are menstruating on the day of the test, you may need to reschedule.  You will be asked to urinate right before the test. How are the results reported? Your test results will be reported as either positive or negative for HPV. If you have a positive result, the results will indicate which HPV strain you are positive for. What do the results mean? A  negative HPV test result means that no HPV was found. This means it is very likely that you do not have HPV. A positive HPV test result means that you have HPV.  If your results show the presence of any high-risk strains, you may have a higher risk of developing anal or cervical cancer if your infection is not treated.  If any low-risk HPV strains are found, it is not likely that you have an increased risk for cancer. Talk with your health care provider about what your results mean. Questions to ask your health care provider Ask your health care provider, or the department that is doing the test:  When will my results be ready?  How will I get my results?  What are my treatment options?  What other tests do I need?  What are my next steps? Summary  The human papillomavirus (HPV) test is used to look for high-risk types of HPV infection. This test is done only for females.  HPV types 16 and 18 are considered high-risk types of HPV. If untreated, these types of infections increase your risk for cancer of the cervix or anus.  A negative HPV test result means that no HPV was found, and it is very likely that you do not have HPV.  A positive HPV test result means that you   have an HPV infection. This information is not intended to replace advice given to you by your health care provider. Make sure you discuss any questions you have with your health care provider. Document Revised: 10/13/2017 Document Reviewed: 09/12/2017 Elsevier Patient Education  2020 [employer].

## 2019-12-24 ENCOUNTER — Encounter: Payer: Self-pay | Admitting: Certified Nurse Midwife

## 2019-12-24 DIAGNOSIS — R87619 Unspecified abnormal cytological findings in specimens from cervix uteri: Secondary | ICD-10-CM | POA: Insufficient documentation

## 2020-01-16 ENCOUNTER — Other Ambulatory Visit: Payer: Self-pay | Admitting: Family Medicine

## 2020-02-04 ENCOUNTER — Encounter: Payer: Self-pay | Admitting: Certified Nurse Midwife

## 2020-02-07 ENCOUNTER — Ambulatory Visit: Payer: No Typology Code available for payment source | Attending: Internal Medicine

## 2020-02-07 DIAGNOSIS — Z23 Encounter for immunization: Secondary | ICD-10-CM

## 2020-03-02 ENCOUNTER — Ambulatory Visit: Payer: Self-pay

## 2020-03-04 ENCOUNTER — Ambulatory Visit: Payer: No Typology Code available for payment source | Attending: Internal Medicine

## 2020-03-04 DIAGNOSIS — Z23 Encounter for immunization: Secondary | ICD-10-CM

## 2020-03-04 NOTE — Progress Notes (Signed)
   Covid-19 Vaccination Clinic  Name:  KYNLEA ROSSETTO    MRN: [id number] DOB: [birth year]  03/04/2020  Ms. Rosenburg was observed post Covid-19 immunization for 15 minutes without incident. She was provided with Vaccine Information Sheet and instruction to access the V-Safe system.   Ms. Stader was instructed to call 911 with any severe reactions post vaccine: Marland Kitchen Difficulty breathing  . Swelling of face and throat  . A fast heartbeat  . A bad rash all over body  . Dizziness and weakness   Immunizations Administered    Name Date Dose VIS Date Route   Pfizer COVID-19 Vaccine 03/04/2020  9:00 AM 0.3 mL 01/08/2019 Intramuscular   Manufacturer: [place]   Lot: [id number]   [place]: [id number]

## 2020-03-29 IMAGING — CR DG CHEST 2V
2 series · 2 of 2 positions shown · non-contrast
Comparison: PA and lateral chest 04/25/2017.

CLINICAL DATA: Preoperative examination. Patient for right total
knee replacement.

EXAM:
CHEST - 2 VIEW

[w chest pa]
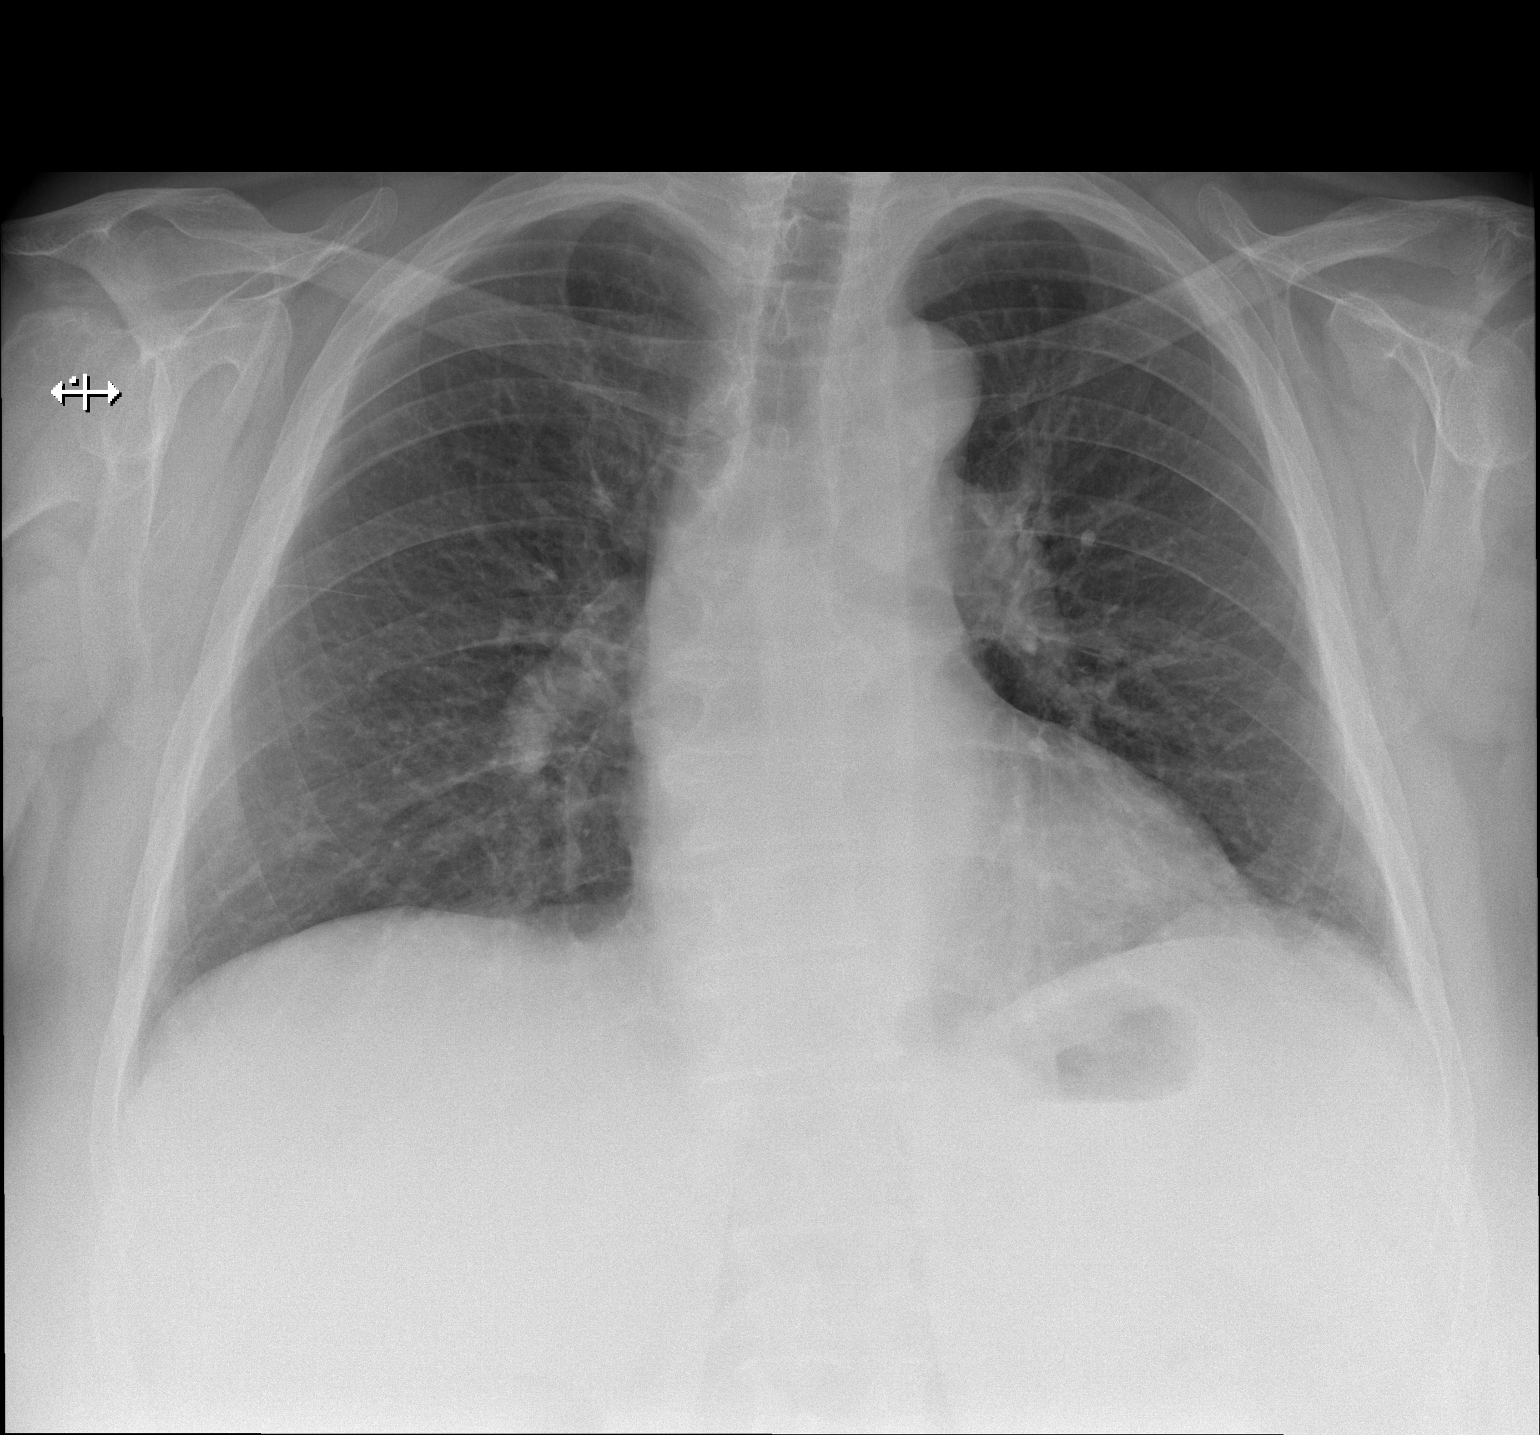

[w chest lat]
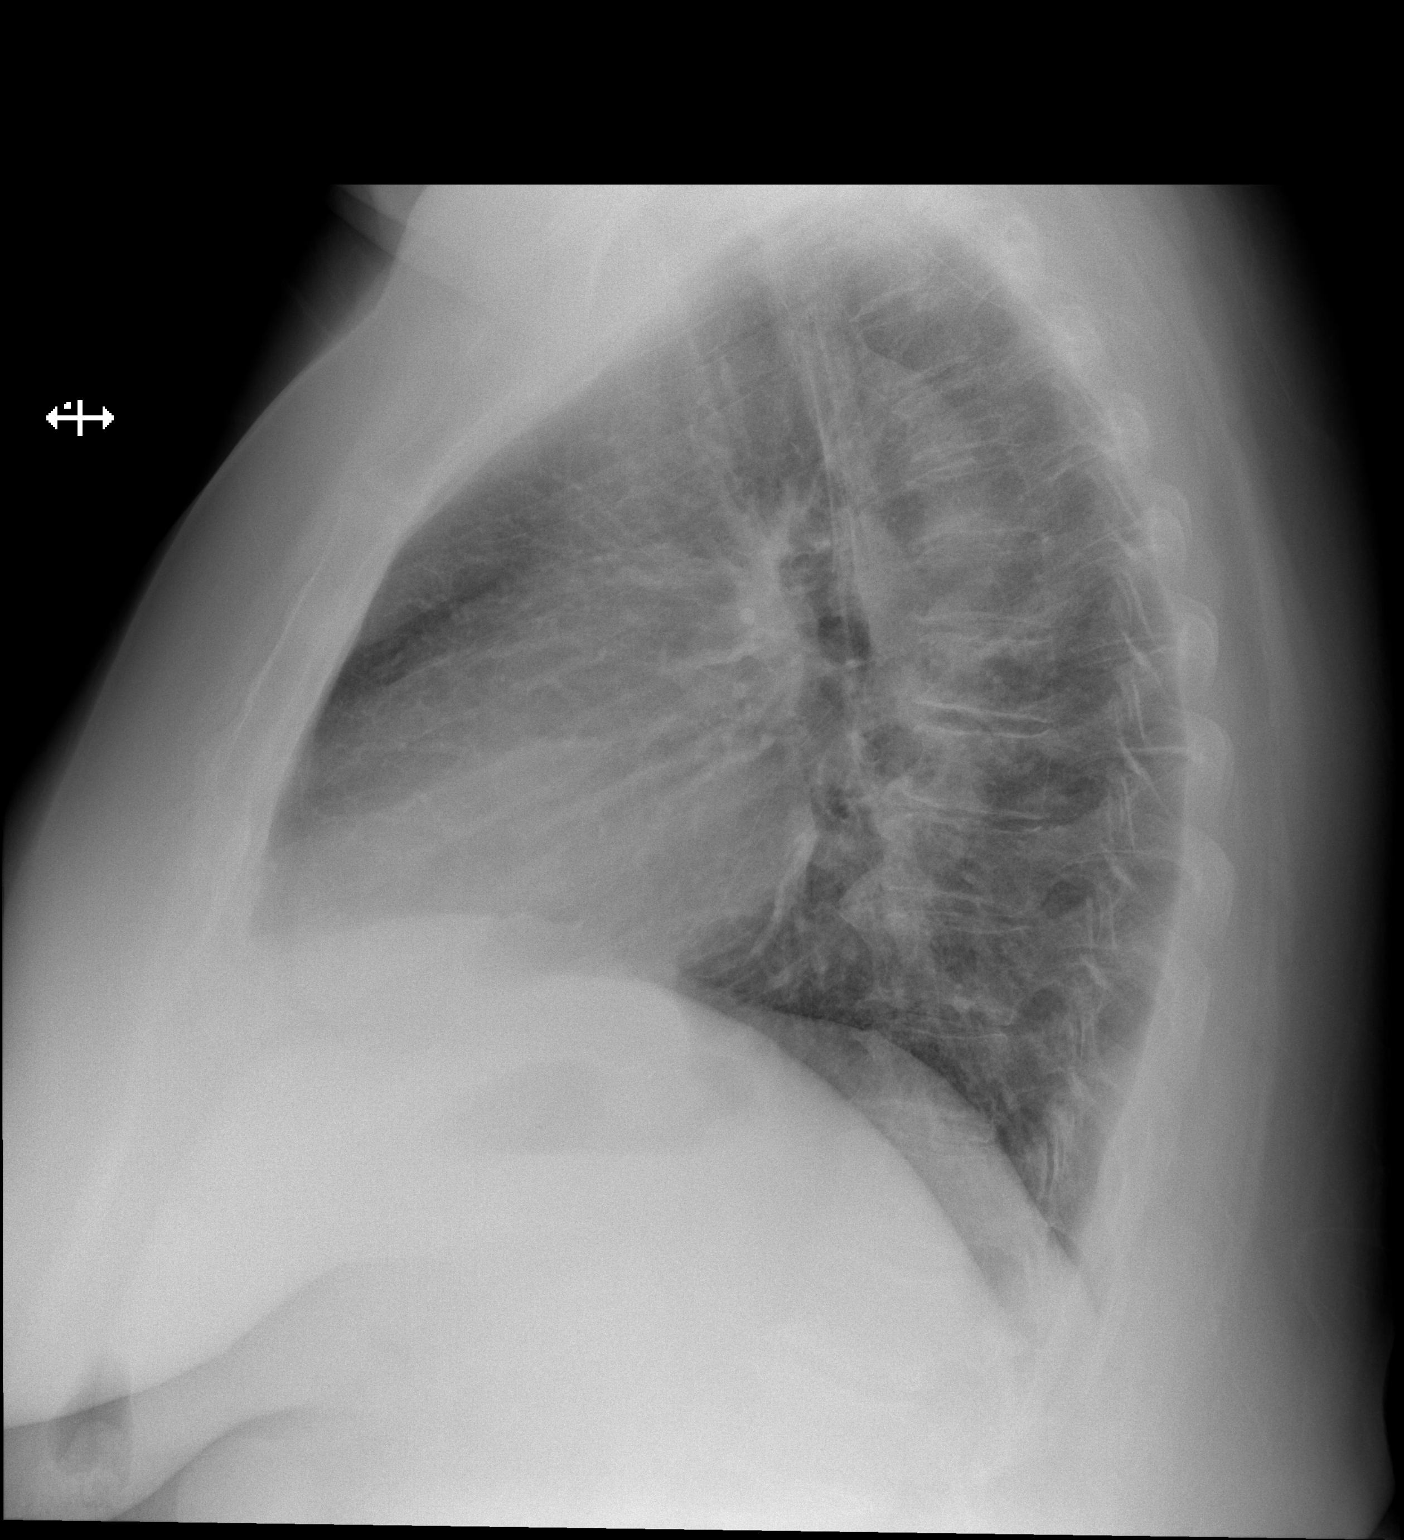

[2 of 2 positions shown; findings below may reference images not displayed]

FINDINGS: The lungs are clear. Heart size is normal. No pneumothorax or
pleural effusion. No acute or focal bony abnormality.
IMPRESSION: No acute disease.

## 2020-05-20 ENCOUNTER — Other Ambulatory Visit: Payer: Self-pay | Admitting: Family Medicine

## 2020-09-16 ENCOUNTER — Other Ambulatory Visit: Payer: Self-pay | Admitting: Family Medicine

## 2020-09-22 ENCOUNTER — Telehealth: Payer: Self-pay

## 2020-09-22 NOTE — Telephone Encounter (Signed)
Patient calls nurse line requesting same day appointment for sick symptoms. Productive cough since Sunday, with clear mucus. Reports having congestion, headache, clogged sinuses and ears, unsure of fever. Patient has received 2-dose COVID vaccine series and denies sick contacts.  Offered to schedule patient appointment for Thursday afternoon in [place] clinic. Patient states she is needing appointment sooner than that. Advised urgent care and provided with resources.   Talbot Grumbling, RN

## 2020-09-28 ENCOUNTER — Ambulatory Visit (HOSPITAL_COMMUNITY)
Admission: EM | Admit: 2020-09-28 | Discharge: 2020-09-28 | Disposition: A | Discharge status: Home / Self Care | Payer: Medicare Other | Attending: Family Medicine | Admitting: Family Medicine

## 2020-09-28 ENCOUNTER — Encounter (HOSPITAL_COMMUNITY): Payer: Self-pay

## 2020-09-28 ENCOUNTER — Other Ambulatory Visit: Payer: Self-pay

## 2020-09-28 DIAGNOSIS — J40 Bronchitis, not specified as acute or chronic: Secondary | ICD-10-CM | POA: Diagnosis not present

## 2020-09-28 MED ORDER — AZITHROMYCIN 250 MG PO TABS: 250.0000 mg | ORAL_TABLET | Freq: Every day | ORAL | 0 refills | Status: DC

## 2020-09-28 MED ORDER — HYDROCODONE-HOMATROPINE 5-1.5 MG/5ML PO SYRP: 5.0000 mL | ORAL_SOLUTION | Freq: Four times a day (QID) | ORAL | 0 refills | Status: DC | PRN

## 2020-09-28 MED ORDER — PREDNISONE 10 MG PO TABS: 40.0000 mg | ORAL_TABLET | Freq: Every day | ORAL | 0 refills | Status: AC

## 2020-09-28 NOTE — ED Triage Notes (Signed)
Pt in with c/o sinus problems, cough, congestion, and ST/ear pain that started over 1 week ago. States that "this has been going on all her life"  Pt has tried sudafed, mucinex with no relief  Denies n/v, diarrhea, or other uri sxs

## 2020-09-28 NOTE — ED Provider Notes (Signed)
[place]    CSN: [id number] Arrival date & time: 09/28/20  1478      History   Chief Complaint Chief Complaint  Patient presents with   Nasal Congestion   sinus issues   Cough    HPI Michele Morrison is a 64 y.o. female.   Patient is a 64 year old female with past medical history of arthritis, diverticulosis, hypertension, hyperlipidemia, prediabetes.  She presents today with approximately 8 days of cough, congestion, sore throat, ear pain.  Frontal sinus pressure.  Reporting harsh cough and coughing up mucus.  She is taking Sudafed, Mucinex without relief.  Denies any fever or recent sick contacts.     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,after shoulder surgery 2956   Complication of anesthesia 12/2018   awareness during surgery   Diverticulosis    H/O blood clots 1996   after a 14 hour plane flight   History of kidney stones    HTN (hypertension)    Hyperlipidemia    Kidney stones    PONV (postoperative nausea and vomiting)    Pre-diabetes     Patient Active Problem List   Diagnosis Date Noted   Abnormal Pap smear of cervix 12/24/2019    Class: Diagnosis of   Primary localized osteoarthritis of left hip 09/10/2019   Primary osteoarthritis of left hip 09/10/2019   Preop cardiovascular exam 09/02/2019   Hip pain 08/23/2019   Primary localized osteoarthritis of right knee 12/25/2018   Primary osteoarthritis of right knee 12/25/2018   Vaginal yeast infection 12/11/2018   Acute bronchitis 04/25/2017   Preventative health care 12/21/2015   Ulcer of ankle ([place]) 05/27/2014   Other malaise and fatigue 01/01/2014   Long term (current) use of anticoagulants 12/25/2010   ESSENTIAL HYPERTENSION, BENIGN 05/03/2010   OBESITY 02/07/2008    Past Surgical History:  Procedure Laterality Date   COLONOSCOPY     JOINT  REPLACEMENT Bilateral    ROTATOR CUFF REPAIR Right    TOTAL HIP ARTHROPLASTY Left 09/10/2019   Procedure: LEFT TOTAL HIP ARTHROPLASTY ANTERIOR APPROACH;  Surgeon: Melrose Nakayama, MD;  Location: [facility];  Service: Orthopedics;  Laterality: Left;   TOTAL KNEE ARTHROPLASTY Left 2012   TOTAL KNEE ARTHROPLASTY Right 12/25/2018   Procedure: TOTAL KNEE ARTHROPLASTY;  Surgeon: Melrose Nakayama, MD;  Location: [facility];  Service: Orthopedics;  Laterality: Right;    OB History    Gravida  0   Para  0   Term  0   Preterm  0   AB  0   Living  0     SAB  0   TAB  0   Ectopic  0   Multiple  0   Live Births  0            Home Medications    Prior to Admission medications   Medication Sig Start Date End Date Taking? Authorizing Provider  azithromycin (ZITHROMAX) 250 MG tablet Take 1 tablet (250 mg total) by mouth daily. Take first 2 tablets together, then 1 every day until finished. 09/28/20   Loura Halt A, NP  hydrochlorothiazide (HYDRODIURIL) 25 MG tablet TAKE 1 TABLET BY MOUTH EVERY DAY 09/16/20   Vanessa [place], Ryan, DO  HYDROcodone-homatropine [facility]) 5-1.5 MG/5ML syrup Take 5 mLs by mouth every 6 (six) hours as needed for cough. 09/28/20   Orvan July, NP  predniSONE (DELTASONE) 10 MG tablet Take 4 tablets (40 mg total) by mouth daily for 5 days. 09/28/20 10/03/20  Orvan July, NP  XARELTO 20 MG TABS tablet TAKE 1 TABLET BY MOUTH EVERY DAY WITH SUPPER 05/20/20   Lurline Del, DO    Family History Family History  Problem Relation Age of Onset   Colon cancer Mother 21   Coronary artery disease Mother    Heart disease Mother    Stroke Mother    Thyroid cancer Mother    Cancer Father        Non-Hodgkin's lymphoma   Coronary artery disease Father    Heart disease Father    Stroke Father     Social History Social History   Tobacco Use   Smoking status: Never Smoker   Smokeless tobacco: Never Used  Scientific laboratory technician Use: Never used  Substance Use  Topics   Alcohol use: Not Currently    Alcohol/week: 0.0 standard drinks   Drug use: Never     Allergies   Latex, Cephalexin, Vancomycin, Neosporin [bacitracin-polymyxin b], Other, and Penicillins   Review of Systems Review of Systems   Physical Exam Triage Vital Signs ED Triage Vitals  Enc Vitals Group     BP 09/28/20 0843 115/89     Pulse Rate 09/28/20 0843 67     Resp 09/28/20 0843 20     Temp 09/28/20 0843 98.3 F (36.8 C)     Temp Source 09/28/20 0843 Oral     SpO2 09/28/20 0843 96 %     Weight --      Height --      Head Circumference --      Peak Flow --      Pain Score 09/28/20 0841 0     Pain Loc --      Pain Edu? --      Excl. in [place]? --    No data found.  Updated Vital Signs BP 115/89 (BP Location: Right Arm)    Pulse 67    Temp 98.3 F (36.8 C) (Oral)    Resp 20    SpO2 96%   Visual Acuity Right Eye Distance:   Left Eye Distance:   Bilateral Distance:    Right Eye Near:   Left Eye Near:    Bilateral Near:     Physical Exam Vitals and nursing note reviewed.  Constitutional:      General: She is not in acute distress.    Appearance: Normal appearance. She is ill-appearing. She is not toxic-appearing or diaphoretic.  HENT:     Head: Normocephalic.     Right Ear: Tympanic membrane and ear canal normal.     Left Ear: Tympanic membrane and ear canal normal.     Nose: Congestion present.     Mouth/Throat:     Pharynx: Oropharynx is clear.  Eyes:     Conjunctiva/sclera: Conjunctivae normal.  Cardiovascular:     Rate and Rhythm: Normal rate and regular rhythm.  Pulmonary:     Effort: Pulmonary effort is normal.     Breath sounds: Wheezing present.  Musculoskeletal:        General: Normal range of motion.     Cervical back: Normal range of motion.  Skin:    General: Skin is warm and dry.     Findings: No rash.  Neurological:     Mental Status: She is alert.  Psychiatric:        Mood and Affect:  Mood normal.      UC Treatments /  Results  Labs (all labs ordered are listed, but only abnormal results are displayed) Labs Reviewed - No data to display  EKG   Radiology No results found.  Procedures Procedures (including critical care time)  Medications Ordered in UC Medications - No data to display  Initial Impression / Assessment and Plan / UC Course  I have reviewed the triage vital signs and the nursing notes.  Pertinent labs & imaging results that were available during my care of the patient were reviewed by me and considered in my medical decision making (see chart for details).     Bronchitis Concern for bacterial component.  Treating with prednisone burst of the next 5 days.  Azithromycin for infection.  Hydrocodone for cough Follow up as needed for continued or worsening symptoms  Final Clinical Impressions(s) / UC Diagnoses   Final diagnoses:  Bronchitis     Discharge Instructions     Treating you for bronchitis  Take the medicines as prescribed  Follow up as needed for continued or worsening symptoms     ED Prescriptions    Medication Sig Dispense Auth. Provider   azithromycin (ZITHROMAX) 250 MG tablet Take 1 tablet (250 mg total) by mouth daily. Take first 2 tablets together, then 1 every day until finished. 6 tablet Draken Farrior A, NP   HYDROcodone-homatropine (HYCODAN) 5-1.5 MG/5ML syrup Take 5 mLs by mouth every 6 (six) hours as needed for cough. 120 mL Annlee Glandon A, NP   predniSONE (DELTASONE) 10 MG tablet Take 4 tablets (40 mg total) by mouth daily for 5 days. 20 tablet Loura Halt A, NP     PDMP not reviewed this encounter.   Orvan July, NP 09/28/20 [phone]

## 2020-09-28 NOTE — Discharge Instructions (Signed)
Treating you for bronchitis  Take the medicines as prescribed  Follow up as needed for continued or worsening symptoms

## 2020-12-21 IMAGING — RF DG C-ARM 1-60 MIN-NO REPORT
1 series · 2 of 2 positions shown · non-contrast
Comparison: August 12, 2019.

CLINICAL DATA: Status post left hip replacement.

EXAM:
OPERATIVE left HIP (WITH PELVIS IF PERFORMED) 2 VIEWS
TECHNIQUE: Fluoroscopic spot image(s) were submitted for interpretation
post-operatively.
Radiation exposure index: 3.8681 mGy.

[Series 1: unknown protocol · 0.20mm/px · 2 of 2 slices shown]
[im 1/2]
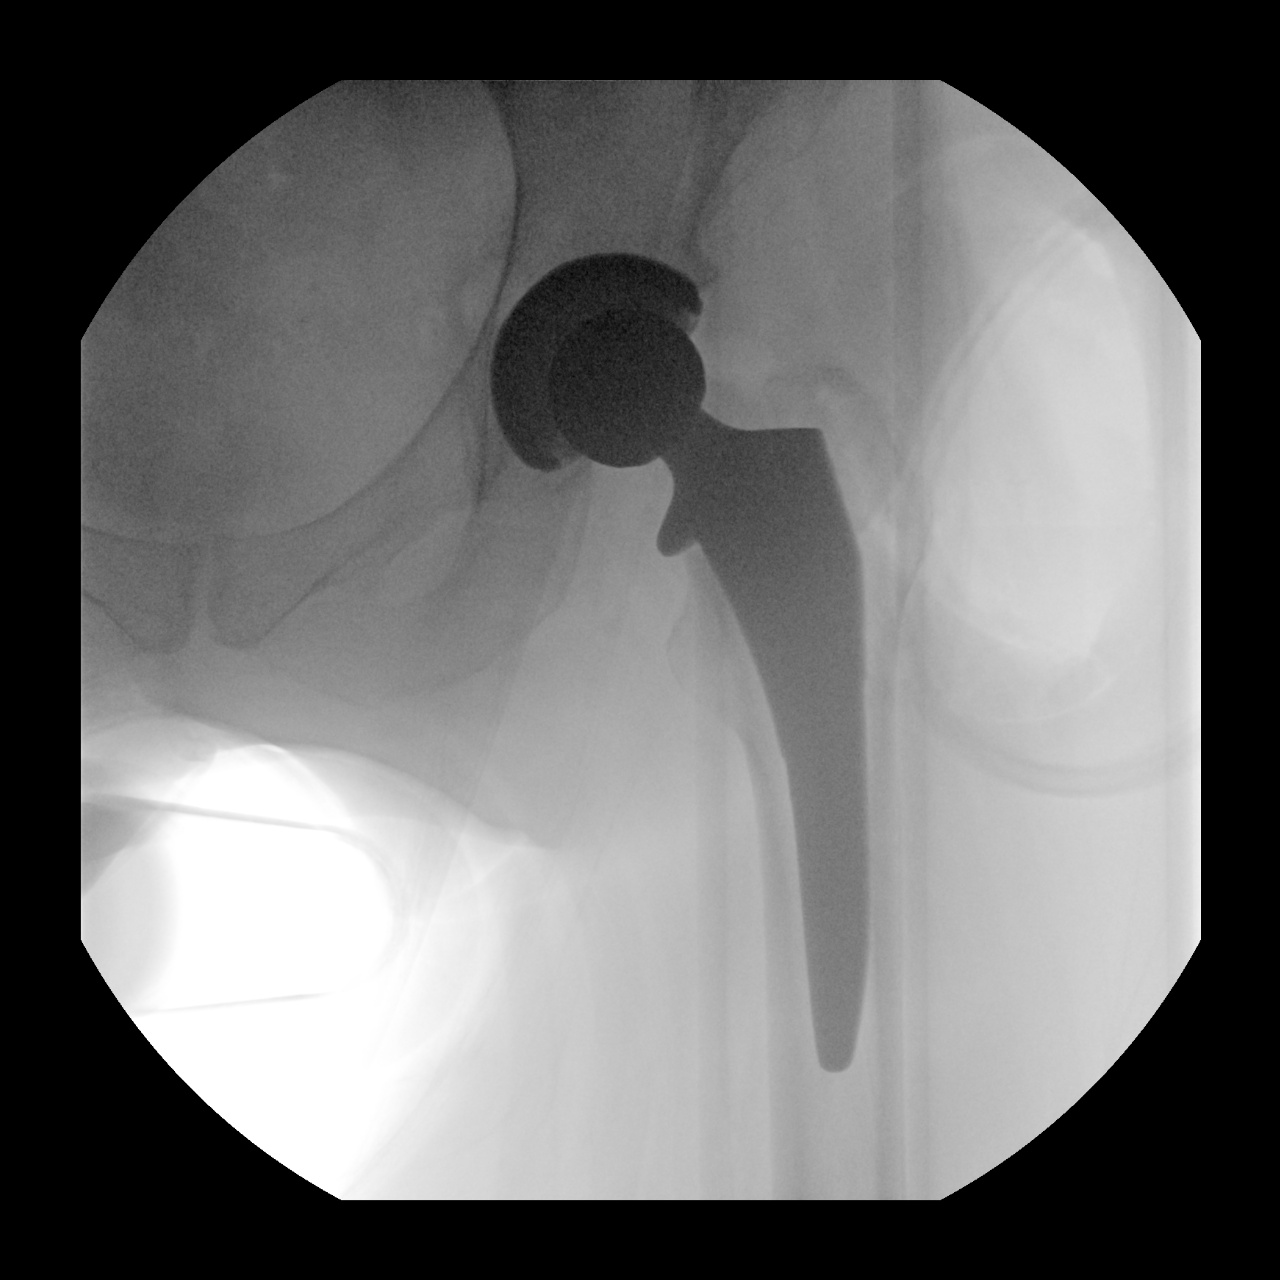
[im 2/2]
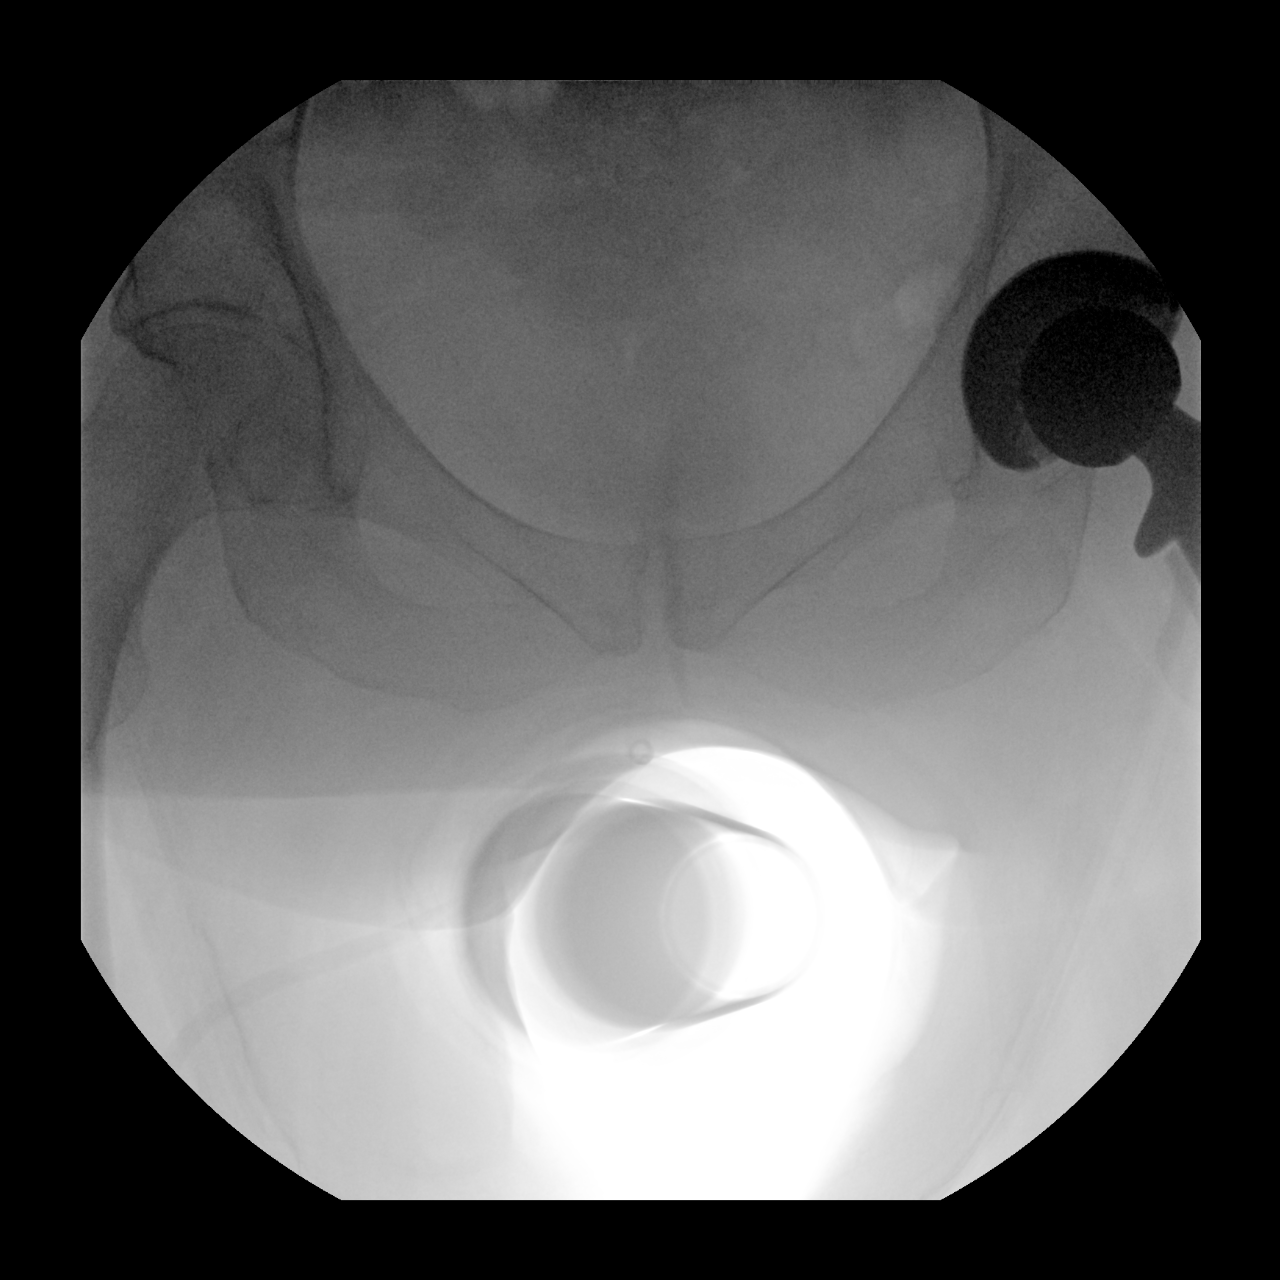

[2 of 2 positions shown; findings below may reference images not displayed]

FINDINGS: Two intraoperative fluoroscopic images were obtained of the left
hip. The left femoral and acetabular components appear to be well
situated.
IMPRESSION: Fluoroscopic guidance provided during left total hip arthroplasty.

## 2021-03-10 ENCOUNTER — Other Ambulatory Visit: Payer: Self-pay | Admitting: Family Medicine

## 2021-03-19 ENCOUNTER — Other Ambulatory Visit: Payer: Self-pay | Admitting: Family Medicine

## 2021-10-21 ENCOUNTER — Other Ambulatory Visit: Payer: Self-pay | Admitting: Family Medicine

## 2022-01-19 ENCOUNTER — Other Ambulatory Visit: Payer: Self-pay | Admitting: Family Medicine

## 2022-04-16 ENCOUNTER — Other Ambulatory Visit: Payer: Self-pay | Admitting: Family Medicine

## 2022-04-19 ENCOUNTER — Encounter: Payer: Self-pay | Admitting: *Deleted

## 2022-05-10 ENCOUNTER — Ambulatory Visit (INDEPENDENT_AMBULATORY_CARE_PROVIDER_SITE_OTHER): Payer: Medicare Other | Admitting: Radiology

## 2022-05-10 ENCOUNTER — Other Ambulatory Visit: Payer: Self-pay | Admitting: Radiology

## 2022-05-10 VITALS — BP 136/80

## 2022-05-10 DIAGNOSIS — R1031 Right lower quadrant pain: Secondary | ICD-10-CM

## 2022-05-10 DIAGNOSIS — N760 Acute vaginitis: Secondary | ICD-10-CM

## 2022-05-10 DIAGNOSIS — N898 Other specified noninflammatory disorders of vagina: Secondary | ICD-10-CM | POA: Diagnosis not present

## 2022-05-10 DIAGNOSIS — M545 Low back pain, unspecified: Secondary | ICD-10-CM | POA: Diagnosis not present

## 2022-05-10 LAB — URINALYSIS, COMPLETE W/RFL CULTURE
Bacteria, UA: NONE SEEN /HPF
Glucose, UA: NEGATIVE
Hgb urine dipstick: NEGATIVE
Hyaline Cast: NONE SEEN /LPF
Ketones, ur: NEGATIVE
Leukocyte Esterase: NEGATIVE
Nitrites, Initial: NEGATIVE
RBC / HPF: NONE SEEN /HPF (ref 0–2)
Specific Gravity, Urine: 1.025 (ref 1.001–1.035)
WBC, UA: NONE SEEN /HPF (ref 0–5)
pH: 5 (ref 5.0–8.0)

## 2022-05-10 LAB — WET PREP FOR TRICH, YEAST, CLUE

## 2022-05-10 LAB — NO CULTURE INDICATED

## 2022-05-10 MED ORDER — FLUCONAZOLE 150 MG PO TABS: 150.0000 mg | ORAL_TABLET | Freq: Once | ORAL | 0 refills | Status: AC

## 2022-05-10 MED ORDER — NYSTATIN-TRIAMCINOLONE 100000-0.1 UNIT/GM-% EX OINT: 1.0000 | TOPICAL_OINTMENT | Freq: Two times a day (BID) | CUTANEOUS | 0 refills | Status: DC

## 2022-05-10 NOTE — Progress Notes (Signed)
   Michele Morrison 08/09/1956 [id number]   History:  66 y.o. G0 presents with complaints of right lower back pain (x's 1 week ago after a 4 hour drive), abdominal bloating, RLQ pain. Denies any vaginal discharge, having vaginal itching/irritation. She has a history of kidney stones. Hx of ovarian insufficiency. 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   Gynecologic History No LMP recorded. Patient is postmenopausal.   Obstetric History OB History  Gravida Para Term Preterm AB Living  0 0 0 0 0 0  SAB IAB Ectopic Multiple Live Births  0 0 0 0 0     The following portions of the patient's history were reviewed and updated as appropriate: allergies, current medications, past family history, past medical history, past social history, past surgical history, and problem list.  Review of Systems Pertinent items noted in HPI and remainder of comprehensive ROS otherwise negative.   Past medical history, past surgical history, family history and social history were all reviewed and documented in the EPIC chart.   Exam:  Vitals:   05/10/22 1509  BP: 136/80   There is no height or weight on file to calculate BMI.  General appearance:  Normal, obese Abdominal  Obese,Soft,nontender, without masses, guarding or rebound.  Liver/spleen:  No organomegaly noted  Hernia:  None appreciated  Skin  Inspection:  Grossly normal  Genitourinary   Inguinal/mons:  Normal without inguinal adenopathy  External genitalia:  Normal appearing vulva with no masses, tenderness, or lesions  BUS/Urethra/Skene's glands:  Normal without masses or exudate  Vagina:  Normal appearing with normal color and discharge, no lesions  Cervix:  Normal appearing without discharge or lesions  Uterus:  Normal in size, shape and contour.    Adnexa/parametria:  unable to assess due to large habitus  Anus and perineum: Normal   Chaperone declined.  Microscopic wet-mount exam shows negative for pathogens, normal epithelial  cells Urine dipstick shows positive for protein and positive for urobilinogen.  Micro exam: negative for WBC's or RBC's.    Assessment/Plan:   1. Right low back pain, unspecified chronicity, unspecified whether sciatica present  - Urinalysis,Complete w/RFL Culture  2. Vaginal itching  - WET PREP FOR TRICH, YEAST, CLUE  3. Vulvovaginitis  - nystatin-triamcinolone ointment (MYCOLOG); Apply 1 Application topically 2 (two) times daily.  Dispense: 30 g; Refill: 0 - diflucan 150mg  po once 4. RLQ abdominal pain  - US Transvaginal Non-OB; Future     Arlie Solomons B WHNP-BC 3:33 PM 05/10/2022

## 2022-05-12 MED ORDER — TRIAMCINOLONE ACETONIDE 0.5 % EX OINT: 1.0000 | TOPICAL_OINTMENT | Freq: Two times a day (BID) | CUTANEOUS | 0 refills | Status: DC

## 2022-05-12 MED ORDER — NYSTATIN 100000 UNIT/GM EX OINT: 1.0000 | TOPICAL_OINTMENT | Freq: Two times a day (BID) | CUTANEOUS | 0 refills | Status: DC

## 2022-05-12 NOTE — Telephone Encounter (Signed)
Both Rx, called patient and informed her as well.

## 2022-05-12 NOTE — Telephone Encounter (Signed)
Please send separate tubes and she will have to mix together.

## 2022-05-12 NOTE — Telephone Encounter (Signed)
Note attached to Rx "Pharmacy comment: Alternative Requested:NOT ON FORMULARY."

## 2022-05-18 ENCOUNTER — Telehealth: Payer: Self-pay

## 2022-05-18 NOTE — Telephone Encounter (Signed)
Message received from front desk  "Patient canceled [place] and did not want to rescheduled. Patient stated "I don't think I need it and its too soon for a follow up anyways." Please update provider. "

## 2022-05-19 ENCOUNTER — Other Ambulatory Visit: Payer: Medicare Other

## 2022-05-19 ENCOUNTER — Other Ambulatory Visit: Payer: Medicare Other | Admitting: Radiology

## 2022-08-01 ENCOUNTER — Other Ambulatory Visit: Payer: Self-pay

## 2022-08-02 MED ORDER — HYDROCHLOROTHIAZIDE 25 MG PO TABS: 25.0000 mg | ORAL_TABLET | Freq: Every day | ORAL | 0 refills | Status: DC

## 2022-08-14 ENCOUNTER — Ambulatory Visit: Admit: 2022-08-14 | Discharge status: Against Medical Advice | Payer: Medicare Other

## 2022-08-15 ENCOUNTER — Ambulatory Visit (HOSPITAL_COMMUNITY)
Admission: RE | Admit: 2022-08-15 | Discharge: 2022-08-15 | Disposition: A | Discharge status: Home / Self Care | Payer: Medicare Other | Source: Ambulatory Visit | Attending: Family Medicine | Admitting: Family Medicine

## 2022-08-15 ENCOUNTER — Ambulatory Visit (INDEPENDENT_AMBULATORY_CARE_PROVIDER_SITE_OTHER): Payer: Medicare Other

## 2022-08-15 ENCOUNTER — Encounter (HOSPITAL_COMMUNITY): Payer: Self-pay

## 2022-08-15 VITALS — BP 152/91 | HR 85 | Temp 98.0°F | Resp 20 | Ht 66.0 in

## 2022-08-15 DIAGNOSIS — J189 Pneumonia, unspecified organism: Secondary | ICD-10-CM

## 2022-08-15 DIAGNOSIS — R059 Cough, unspecified: Secondary | ICD-10-CM

## 2022-08-15 DIAGNOSIS — J309 Allergic rhinitis, unspecified: Secondary | ICD-10-CM | POA: Diagnosis not present

## 2022-08-15 MED ORDER — AZITHROMYCIN 250 MG PO TABS: 250.0000 mg | ORAL_TABLET | Freq: Every day | ORAL | 0 refills | Status: DC

## 2022-08-15 MED ORDER — HYDROCODONE BIT-HOMATROP MBR 5-1.5 MG/5ML PO SOLN: 5.0000 mL | Freq: Four times a day (QID) | ORAL | 0 refills | Status: DC | PRN

## 2022-08-15 MED ORDER — DOXYCYCLINE HYCLATE 100 MG PO CAPS: 100.0000 mg | ORAL_CAPSULE | Freq: Two times a day (BID) | ORAL | 0 refills | Status: AC

## 2022-08-15 MED ORDER — FEXOFENADINE HCL 180 MG PO TABS: 180.0000 mg | ORAL_TABLET | Freq: Every day | ORAL | 0 refills | Status: DC

## 2022-08-15 MED ORDER — PREDNISONE 10 MG PO TABS: 20.0000 mg | ORAL_TABLET | Freq: Every day | ORAL | 0 refills | Status: DC

## 2022-08-15 MED ORDER — BENZONATATE 200 MG PO CAPS: 200.0000 mg | ORAL_CAPSULE | Freq: Three times a day (TID) | ORAL | 0 refills | Status: AC | PRN

## 2022-08-15 NOTE — Discharge Instructions (Addendum)
Advised/informed patient of chest x-ray results with hard copy provided to patient.  Advised patient to take prednisone and Zithromax with first dose of Doxycycline for the next 5 of 10 days.  Advised may take Tessalon daily or as needed for cough.  Advised may use Hycodan for cough as well.  Patient advised of sedating effects of Hycodan and not to take with [facility], use 1 cough medication or the other.  Encouraged patient to increase daily water intake while taking these medications.  Advised patient to follow-up with PCP or here for repeat chest x-ray in 2 weeks or on 08/30/2022.  Advised if symptoms worsen and/or unresolved please follow-up with PCP or here for further evaluation.

## 2022-08-15 NOTE — ED Triage Notes (Signed)
Pt is here for sore throat, dizziness, right and left ear pain, cough, fatigue body aches , headaches , not eating x 2days . Pt took a home covid test yesterday and it was negative.

## 2022-08-15 NOTE — ED Provider Notes (Signed)
[place]    CSN: [id number] Arrival date & time: 08/15/22  1021      History   Chief Complaint Chief Complaint  Patient presents with   Nasal Congestion    Cough, headach, wheezing, possible bronchitis. - Entered by patient   Wheezing   Cough   Sore Throat   Otalgia   Shortness of Breath    HPI MASHAWN BRAZIL is a 66 y.o. female.   HPI 66 year old female presents with cough, headache, wheezing fatigue, body aches and bilateral ear pain for 2 days.  Reports increased fatigue over the past 2 to 3 days.  Patient reports negative home COVID-19 test yesterday.  PMH significant for morbid obesity, other malaise and fatigue, and chronic current use of anticoagulants.  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,after shoulder surgery 6378   Complication of anesthesia 12/2018   awareness during surgery   Diverticulosis    H/O blood clots 1996   after a 14 hour plane flight   History of kidney stones    HTN (hypertension)    Hyperlipidemia    Kidney stones    PONV (postoperative nausea and vomiting)    Pre-diabetes     Patient Active Problem List   Diagnosis Date Noted   Abnormal Pap smear of cervix 12/24/2019    Class: Diagnosis of   Primary localized osteoarthritis of left hip 09/10/2019   Primary osteoarthritis of left hip 09/10/2019   Preop cardiovascular exam 09/02/2019   Hip pain 08/23/2019   Primary localized osteoarthritis of right knee 12/25/2018   Primary osteoarthritis of right knee 12/25/2018   Vaginal yeast infection 12/11/2018   Acute bronchitis 04/25/2017   Preventative health care 12/21/2015   Ulcer of ankle ([place]) 05/27/2014   Other malaise and fatigue 01/01/2014   Long term (current) use of anticoagulants 12/25/2010   ESSENTIAL HYPERTENSION, BENIGN 05/03/2010   OBESITY 02/07/2008    Past Surgical History:  Procedure Laterality Date    COLONOSCOPY     JOINT REPLACEMENT Bilateral    ROTATOR CUFF REPAIR Right    TOTAL HIP ARTHROPLASTY Left 09/10/2019   Procedure: LEFT TOTAL HIP ARTHROPLASTY ANTERIOR APPROACH;  Surgeon: Melrose Nakayama, MD;  Location: [facility];  Service: Orthopedics;  Laterality: Left;   TOTAL KNEE ARTHROPLASTY Left 2012   TOTAL KNEE ARTHROPLASTY Right 12/25/2018   Procedure: TOTAL KNEE ARTHROPLASTY;  Surgeon: Melrose Nakayama, MD;  Location: [facility];  Service: Orthopedics;  Laterality: Right;    OB History     Gravida  0   Para  0   Term  0   Preterm  0   AB  0   Living  0      SAB  0   IAB  0   Ectopic  0   Multiple  0   Live Births  0            Home Medications    Prior to Admission medications   Medication Sig Start Date End Date Taking? Authorizing Provider  azithromycin (ZITHROMAX) 250 MG tablet Take 1 tablet (250 mg total) by mouth daily. Take first 2 tablets together, then 1 every day until finished. 08/15/22  Yes Eliezer Lofts, FNP  benzonatate (TESSALON) 200 MG capsule Take 1 capsule (200 mg total) by mouth 3 (three) times daily as needed for up to 7 days for cough. 08/15/22 08/22/22 Yes  Eliezer Lofts, FNP  doxycycline (VIBRAMYCIN) 100 MG capsule Take 1 capsule (100 mg total) by mouth 2 (two) times daily for 10 days. 08/15/22 08/25/22 Yes Eliezer Lofts, FNP  fexofenadine [facility] ALLERGY) 180 MG tablet Take 1 tablet (180 mg total) by mouth daily for 15 days. 08/15/22 08/30/22 Yes Eliezer Lofts, FNP  HYDROcodone bit-homatropine (HYCODAN) 5-1.5 MG/5ML syrup Take 5 mLs by mouth every 6 (six) hours as needed for cough. 08/15/22  Yes Eliezer Lofts, FNP  predniSONE (DELTASONE) 10 MG tablet Take 2 tablets (20 mg total) by mouth daily. 08/15/22  Yes Eliezer Lofts, FNP  hydrochlorothiazide (HYDRODIURIL) 25 MG tablet Take 1 tablet (25 mg total) by mouth daily. Please make appointment with PCP before next refill. 08/02/22   Arlyce Dice, MD  nystatin ointment (MYCOSTATIN) Apply 1  Application topically 2 (two) times daily. 05/12/22   Chrzanowski, Jami B, NP  triamcinolone ointment (KENALOG) 0.5 % Apply 1 Application topically 2 (two) times daily. 05/12/22   Chrzanowski, Jami B, NP  XARELTO 20 MG TABS tablet TAKE 1 TABLET BY MOUTH EVERY DAY WITH SUPPER 01/19/22   Lurline Del, DO    Family History Family History  Problem Relation Age of Onset   Colon cancer Mother 54   Coronary artery disease Mother    Heart disease Mother    Stroke Mother    Thyroid cancer Mother    Cancer Father        Non-Hodgkin's lymphoma   Coronary artery disease Father    Heart disease Father    Stroke Father     Social History Social History   Tobacco Use   Smoking status: Never   Smokeless tobacco: Never  Vaping Use   Vaping Use: Never used  Substance Use Topics   Alcohol use: Not Currently    Alcohol/week: 0.0 standard drinks of alcohol   Drug use: Never     Allergies   Latex, Cephalexin, Vancomycin, Neosporin [bacitracin-polymyxin b], Other, and Penicillins   Review of Systems Review of Systems  HENT:  Positive for congestion and ear pain.   Respiratory:  Positive for cough.   All other systems reviewed and are negative.    Physical Exam Triage Vital Signs ED Triage Vitals  Enc Vitals Group     BP      Pulse      Resp      Temp      Temp src      SpO2      Weight      Height      Head Circumference      Peak Flow      Pain Score      Pain Loc      Pain Edu?      Excl. in [place]?    No data found.  Updated Vital Signs BP (!) 152/91 (BP Location: Right Wrist)   Pulse 85   Temp 98 F (36.7 C) (Oral)   Resp 20   Ht '5\' 6"'$  (1.676 m)   SpO2 94%   BMI 38.25 kg/m       Physical Exam Vitals and nursing note reviewed.  Constitutional:      General: She is not in acute distress.    Appearance: Normal appearance. She is obese. She is ill-appearing.  HENT:     Head: Normocephalic and atraumatic.     Right Ear: Tympanic membrane, ear canal and  external ear normal.     Left Ear: Tympanic membrane, ear canal and  external ear normal.     Nose: Nose normal.     Mouth/Throat:     Mouth: Mucous membranes are moist.     Pharynx: Oropharynx is clear.     Comments: Significant amount of clear drainage of posterior oropharynx noted Eyes:     Extraocular Movements: Extraocular movements intact.     Conjunctiva/sclera: Conjunctivae normal.     Pupils: Pupils are equal, round, and reactive to light.  Cardiovascular:     Rate and Rhythm: Normal rate and regular rhythm.     Pulses: Normal pulses.     Heart sounds: Normal heart sounds.  Pulmonary:     Effort: Pulmonary effort is normal.     Breath sounds: Normal breath sounds. No wheezing, rhonchi or rales.     Comments: Diminished air intake over bases with fine rales and frequent nonproductive cough noted on exam Musculoskeletal:        General: Normal range of motion.     Cervical back: Normal range of motion and neck supple.  Skin:    General: Skin is warm and dry.  Neurological:     General: No focal deficit present.     Mental Status: She is alert and oriented to person, place, and time.     Cranial Nerves: No cranial nerve deficit.     Sensory: No sensory deficit.      UC Treatments / Results  Labs (all labs ordered are listed, but only abnormal results are displayed) Labs Reviewed - No data to display  EKG   Radiology DG Chest 2 View  Result Date: 08/15/2022 CLINICAL DATA:  Provided history: Cough. Additional history provided: Sore throat, dizziness, right and left ear pain, fatigue, body aches, headache. EXAM: CHEST - 2 VIEW COMPARISON:  Prior chest radiographs 12/17/2018. FINDINGS: Heart size within normal limits. Minimal ill-defined opacity within the lateral left lung base, with an appearance favoring atelectasis. No appreciable airspace consolidation on the right. No evidence of pleural effusion or pneumothorax. Degenerative changes of the spine. IMPRESSION:  Minimal ill-defined opacity within the lateral left lung base. The appearance favors atelectasis. However, early pneumonia is difficult to definitively exclude. Correlate clinically and consider short-interval radiographic follow-up. Electronically Signed   By: Kellie Simmering D.O.   On: 08/15/2022 11:45    Procedures Procedures (including critical care time)  Medications Ordered in UC Medications - No data to display  Initial Impression / Assessment and Plan / UC Course  I have reviewed the triage vital signs and the nursing notes.  Pertinent labs & imaging results that were available during my care of the patient were reviewed by me and considered in my medical decision making (see chart for details).     MDM: 1.  Cough-CXR revealed above, Rx'd prednisone, Tessalon, Hycodan; 2.  Community-acquired pneumonia of left lung, unspecified part of lung-Rx Zithromax, Doxycycline; 3.  Allergic rhinitis-Rx'd Allegra. Advised/informed patient of chest x-ray results with hard copy provided to patient.  Advised patient to take prednisone and Zithromax with first dose of Doxycycline for the next 5 of 10 days.  Advised may take Tessalon daily or as needed for cough.  Advised may use Hycodan for cough as well.  Patient advised of sedating effects of Hycodan and not to take with [facility], use 1 cough medication or the other.  Encouraged patient to increase daily water intake while taking these medications.  Advised patient to follow-up with PCP or here for repeat chest x-ray in 2 weeks or on 08/30/2022.  Advised if symptoms worsen and/or unresolved please follow-up with PCP or here for further evaluation.  Patient discharged home, hemodynamically stable. Final Clinical Impressions(s) / UC Diagnoses   Final diagnoses:  Cough, unspecified type  Allergic rhinitis, unspecified seasonality, unspecified trigger  Community acquired pneumonia of left lung, unspecified part of lung     Discharge Instructions       Advised/informed patient of chest x-ray results with hard copy provided to patient.  Advised patient to take prednisone and Zithromax with first dose of Doxycycline for the next 5 of 10 days.  Advised may take Tessalon daily or as needed for cough.  Advised may use Hycodan for cough as well.  Patient advised of sedating effects of Hycodan and not to take with [facility], use 1 cough medication or the other.  Encouraged patient to increase daily water intake while taking these medications.  Advised patient to follow-up with PCP or here for repeat chest x-ray in 2 weeks or on 08/30/2022.  Advised if symptoms worsen and/or unresolved please follow-up with PCP or here for further evaluation.     ED Prescriptions     Medication Sig Dispense Auth. Provider   azithromycin (ZITHROMAX) 250 MG tablet Take 1 tablet (250 mg total) by mouth daily. Take first 2 tablets together, then 1 every day until finished. 6 tablet Eliezer Lofts, FNP   doxycycline (VIBRAMYCIN) 100 MG capsule Take 1 capsule (100 mg total) by mouth 2 (two) times daily for 10 days. 20 capsule Eliezer Lofts, FNP   fexofenadine [facility] ALLERGY) 180 MG tablet Take 1 tablet (180 mg total) by mouth daily for 15 days. 15 tablet Eliezer Lofts, FNP   HYDROcodone bit-homatropine (HYCODAN) 5-1.5 MG/5ML syrup Take 5 mLs by mouth every 6 (six) hours as needed for cough. 120 mL Eliezer Lofts, FNP   benzonatate (TESSALON) 200 MG capsule Take 1 capsule (200 mg total) by mouth 3 (three) times daily as needed for up to 7 days for cough. 40 capsule Eliezer Lofts, FNP   predniSONE (DELTASONE) 10 MG tablet Take 2 tablets (20 mg total) by mouth daily. 15 tablet Eliezer Lofts, FNP      I have reviewed the PDMP during this encounter.   Eliezer Lofts, [place] 08/15/22 1229

## 2022-08-29 ENCOUNTER — Encounter (HOSPITAL_COMMUNITY): Payer: Self-pay

## 2022-08-29 ENCOUNTER — Ambulatory Visit (HOSPITAL_COMMUNITY)
Admission: RE | Admit: 2022-08-29 | Discharge: 2022-08-29 | Disposition: A | Discharge status: Home / Self Care | Payer: Medicare Other | Source: Ambulatory Visit | Attending: Physician Assistant | Admitting: Physician Assistant

## 2022-08-29 ENCOUNTER — Ambulatory Visit (INDEPENDENT_AMBULATORY_CARE_PROVIDER_SITE_OTHER): Payer: Medicare Other

## 2022-08-29 VITALS — BP 125/70 | HR 72 | Temp 97.9°F | Resp 12

## 2022-08-29 DIAGNOSIS — Z8701 Personal history of pneumonia (recurrent): Secondary | ICD-10-CM | POA: Diagnosis not present

## 2022-08-29 DIAGNOSIS — R051 Acute cough: Secondary | ICD-10-CM

## 2022-08-29 DIAGNOSIS — R0602 Shortness of breath: Secondary | ICD-10-CM | POA: Diagnosis not present

## 2022-08-29 DIAGNOSIS — J9801 Acute bronchospasm: Secondary | ICD-10-CM | POA: Diagnosis not present

## 2022-08-29 MED ORDER — ALBUTEROL SULFATE HFA 108 (90 BASE) MCG/ACT IN AERS
2.0000 | INHALATION_SPRAY | Freq: Once | RESPIRATORY_TRACT | Status: AC
  Administered 2022-08-29: 2 via RESPIRATORY_TRACT

## 2022-08-29 MED ORDER — PREDNISONE 10 MG (21) PO TBPK: ORAL_TABLET | ORAL | 0 refills | Status: DC

## 2022-08-29 MED ORDER — METHYLPREDNISOLONE SODIUM SUCC 125 MG IJ SOLR
60.0000 mg | Freq: Once | INTRAMUSCULAR | Status: AC
  Administered 2022-08-29: 0.96 mg via INTRAMUSCULAR

## 2022-08-29 MED ORDER — METHYLPREDNISOLONE SODIUM SUCC 125 MG IJ SOLR
INTRAMUSCULAR | Status: AC
  Filled 2022-08-29: qty 2

## 2022-08-29 MED ORDER — ALBUTEROL SULFATE HFA 108 (90 BASE) MCG/ACT IN AERS
INHALATION_SPRAY | RESPIRATORY_TRACT | Status: AC
  Filled 2022-08-29: qty 6.7

## 2022-08-29 MED ORDER — ALBUTEROL SULFATE (2.5 MG/3ML) 0.083% IN NEBU
INHALATION_SOLUTION | RESPIRATORY_TRACT | Status: AC
  Filled 2022-08-29: qty 3

## 2022-08-29 MED ORDER — IPRATROPIUM-ALBUTEROL 0.5-2.5 (3) MG/3ML IN SOLN
RESPIRATORY_TRACT | Status: AC
  Filled 2022-08-29: qty 3

## 2022-08-29 MED ORDER — IPRATROPIUM-ALBUTEROL 0.5-2.5 (3) MG/3ML IN SOLN
3.0000 mL | Freq: Once | RESPIRATORY_TRACT | Status: AC
  Administered 2022-08-29: 3 mL via RESPIRATORY_TRACT

## 2022-08-29 NOTE — Discharge Instructions (Signed)
Your x-ray was normal which is great news.  We gave you an injection of steroids today to help with the inflammation in your lungs.  Please start prednisone tomorrow (08/30/2022).  Do not take NSAIDs with this medication including aspirin, ibuprofen/Advil, naproxen/Aleve.  You can use Tylenol/acetaminophen for additional symptom relief.  Follow-up with your primary care this week.  If you have any worsening symptoms including severe cough, shortness of breath, weakness, nausea/vomiting interfering with oral intake, chest pain you need to go to the emergency room immediately.

## 2022-08-29 NOTE — ED Triage Notes (Signed)
Pt is here for follow up cough

## 2022-08-29 NOTE — ED Provider Notes (Signed)
Michele Morrison    CSN: [id number] Arrival date & time: 08/29/22  1404      History   Chief Complaint Chief Complaint  Patient presents with   Cough    This is a follow up for pneumonia 2 weeks ago. And need a chest xray. - Entered by patient   Follow-up    HPI GENESI Morrison is a 66 y.o. female.   Patient presents today for follow-up of cough.  Reports that on 08/15/2022 she was seen by clinic at which time chest x-ray showed opacity in left lateral lung base that fevers atelectasis but could not rule out pneumonia.  Patient was empirically treated with doxycycline and azithromycin.  She was also prescribed several other medications including low-dose prednisone, Hycodan, Allegra.  Reports that with the combination of all of these medications she is feeling better.  She is interested in having repeat chest x-ray to ensure improvement of symptoms.  She continues to have some cough particularly with deep breathing or activity.  Denies full diagnosis of COPD or asthma but reports she is gotten bronchitis almost annually since as long as she can remember.  Does report a family history of COPD in her mother who is a non-smoker.  Patient denies history of smoking.  Denies any additional antibiotic use.  Reports she was told she was prediabetic but denies formal diagnosis of diabetes.    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,after shoulder surgery 0349   Complication of anesthesia 12/2018   awareness during surgery   Diverticulosis    H/O blood clots 1996   after a 14 hour plane flight   History of kidney stones    HTN (hypertension)    Hyperlipidemia    Kidney stones    PONV (postoperative nausea and vomiting)    Pre-diabetes     Patient Active Problem List   Diagnosis Date Noted   Abnormal Pap smear of cervix 12/24/2019    Class: Diagnosis of   Primary localized  osteoarthritis of left hip 09/10/2019   Primary osteoarthritis of left hip 09/10/2019   Preop cardiovascular exam 09/02/2019   Hip pain 08/23/2019   Primary localized osteoarthritis of right knee 12/25/2018   Primary osteoarthritis of right knee 12/25/2018   Vaginal yeast infection 12/11/2018   Acute bronchitis 04/25/2017   Preventative health care 12/21/2015   Ulcer of ankle ([place]) 05/27/2014   Other malaise and fatigue 01/01/2014   Long term (current) use of anticoagulants 12/25/2010   ESSENTIAL HYPERTENSION, BENIGN 05/03/2010   OBESITY 02/07/2008    Past Surgical History:  Procedure Laterality Date   COLONOSCOPY     JOINT REPLACEMENT Bilateral    ROTATOR CUFF REPAIR Right    TOTAL HIP ARTHROPLASTY Left 09/10/2019   Procedure: LEFT TOTAL HIP ARTHROPLASTY ANTERIOR APPROACH;  Surgeon: Melrose Nakayama, MD;  Location: [facility];  Service: Orthopedics;  Laterality: Left;   TOTAL KNEE ARTHROPLASTY Left 2012   TOTAL KNEE ARTHROPLASTY Right 12/25/2018   Procedure: TOTAL KNEE ARTHROPLASTY;  Surgeon: Melrose Nakayama, MD;  Location: [facility];  Service: Orthopedics;  Laterality: Right;    OB History     Gravida  0   Para  0   Term  0   Preterm  0   AB  0   Living  0      SAB  0   IAB  0  Ectopic  0   Multiple  0   Live Births  0            Home Medications    Prior to Admission medications   Medication Sig Start Date End Date Taking? Authorizing Provider  predniSONE (STERAPRED UNI-PAK 21 TAB) 10 MG (21) TBPK tablet As directed 08/29/22  Yes Shenequa Howse K, PA-C  fexofenadine (ALLEGRA ALLERGY) 180 MG tablet Take 1 tablet (180 mg total) by mouth daily for 15 days. 08/15/22 08/30/22  Eliezer Lofts, FNP  hydrochlorothiazide (HYDRODIURIL) 25 MG tablet Take 1 tablet (25 mg total) by mouth daily. Please make appointment with PCP before next refill. 08/02/22   Arlyce Dice, MD  HYDROcodone bit-homatropine (HYCODAN) 5-1.5 MG/5ML syrup Take 5 mLs by mouth every 6 (six) hours as  needed for cough. 08/15/22   Eliezer Lofts, FNP  nystatin ointment (MYCOSTATIN) Apply 1 Application topically 2 (two) times daily. 05/12/22   Chrzanowski, Jami B, NP  triamcinolone ointment (KENALOG) 0.5 % Apply 1 Application topically 2 (two) times daily. 05/12/22   Chrzanowski, Jami B, NP  XARELTO 20 MG TABS tablet TAKE 1 TABLET BY MOUTH EVERY DAY WITH SUPPER 01/19/22   Lurline Del, DO    Family History Family History  Problem Relation Age of Onset   Colon cancer Mother 20   Coronary artery disease Mother    Heart disease Mother    Stroke Mother    Thyroid cancer Mother    Cancer Father        Non-Hodgkin's lymphoma   Coronary artery disease Father    Heart disease Father    Stroke Father     Social History Social History   Tobacco Use   Smoking status: Never   Smokeless tobacco: Never  Vaping Use   Vaping Use: Never used  Substance Use Topics   Alcohol use: Not Currently    Alcohol/week: 0.0 standard drinks of alcohol   Drug use: Never     Allergies   Latex, Cephalexin, Vancomycin, Neosporin [bacitracin-polymyxin b], Other, and Penicillins   Review of Systems Review of Systems  Constitutional:  Positive for activity change. Negative for appetite change, fatigue and fever.  HENT:  Negative for congestion, sinus pressure, sneezing and sore throat.   Respiratory:  Positive for cough and shortness of breath. Negative for chest tightness and wheezing.   Cardiovascular:  Negative for chest pain.  Gastrointestinal:  Negative for abdominal pain, diarrhea, nausea and vomiting.     Physical Exam Triage Vital Signs ED Triage Vitals [08/29/22 1440]  Enc Vitals Group     BP 125/70     Pulse Rate 72     Resp 12     Temp 97.9 F (36.6 C)     Temp Source Oral     SpO2 96 %     Weight      Height      Head Circumference      Peak Flow      Pain Score      Pain Loc      Pain Edu?      Excl. in [place]?    No data found.  Updated Vital Signs BP 125/70 (BP Location:  Right Arm)   Pulse 72   Temp 97.9 F (36.6 C) (Oral)   Resp 12   SpO2 96%   Visual Acuity Right Eye Distance:   Left Eye Distance:   Bilateral Distance:    Right Eye Near:   Left Eye Near:  Bilateral Near:     Physical Exam Vitals reviewed.  Constitutional:      General: She is awake. She is not in acute distress.    Appearance: Normal appearance. She is well-developed. She is not ill-appearing.     Comments: Appears stated age in no acute distress sitting comfortably in exam room  HENT:     Head: Normocephalic and atraumatic.     Right Ear: Tympanic membrane, ear canal and external ear normal. Tympanic membrane is not erythematous or bulging.     Left Ear: Tympanic membrane, ear canal and external ear normal. Tympanic membrane is not erythematous or bulging.     Nose:     Right Sinus: No maxillary sinus tenderness or frontal sinus tenderness.     Left Sinus: No maxillary sinus tenderness or frontal sinus tenderness.     Mouth/Throat:     Pharynx: Uvula midline. No oropharyngeal exudate or posterior oropharyngeal erythema.  Cardiovascular:     Rate and Rhythm: Normal rate and regular rhythm.     Heart sounds: Normal heart sounds, S1 normal and S2 normal. No murmur heard. Pulmonary:     Effort: Pulmonary effort is normal.     Breath sounds: Normal breath sounds. No wheezing, rhonchi or rales.     Comments: Reactive cough with deep breathing Psychiatric:        Behavior: Behavior is cooperative.      UC Treatments / Results  Labs (all labs ordered are listed, but only abnormal results are displayed) Labs Reviewed - No data to display  EKG   Radiology DG Chest 2 View  Result Date: 08/29/2022 CLINICAL DATA:  Shortness of breath EXAM: CHEST - 2 VIEW COMPARISON:  08/15/2022, 12/17/2018 FINDINGS: The heart size and mediastinal contours are within normal limits. Both lungs are clear. Aortic atherosclerosis. The visualized skeletal structures are unremarkable.  IMPRESSION: No active cardiopulmonary disease. Electronically Signed   By: Donavan Foil M.D.   On: 08/29/2022 15:42    Procedures Procedures (including critical care time)  Medications Ordered in UC Medications  albuterol (VENTOLIN HFA) 108 (90 Base) MCG/ACT inhaler 2 puff (2 puffs Inhalation Given 08/29/22 1516)  ipratropium-albuterol (DUONEB) 0.5-2.5 (3) MG/3ML nebulizer solution 3 mL (3 mLs Nebulization Given 08/29/22 1602)  methylPREDNISolone sodium succinate (SOLU-MEDROL) 125 mg/2 mL injection 60 mg (0.96 mg Intramuscular Given 08/29/22 1610)    Initial Impression / Assessment and Plan / UC Course  I have reviewed the triage vital signs and the nursing notes.  Pertinent labs & imaging results that were available during my care of the patient were reviewed by me and considered in my medical decision making (see chart for details).     X-ray was obtained that showed no acute cardiopulmonary disease and resolution of abnormality noted 2 weeks ago.  Patient was given several puffs of albuterol but found that this actually worsened her cough.  She was then given DuoNeb in clinic with 60 mg of Solu-Medrol.  Patient tolerated this medication well and reports that she had some improvement of symptoms.  She was started on prednisone taper with instruction to take NSAIDs with this medication.  Can use over-the-counter medications including Mucinex, Tylenol, Flonase.  Encouraged her to rest and drink plenty of fluid.  Recommended follow-up with her primary care if symptoms or not improving quickly.  If she has any worsening symptoms she needs to be seen immediately to which she expressed understanding.  Strict return precautions given.  Final Clinical Impressions(s) / UC Diagnoses  Final diagnoses:  Acute cough  History of pneumonia  Bronchospasm     Discharge Instructions      Your x-ray was normal which is great news.  We gave you an injection of steroids today to help with the  inflammation in your lungs.  Please start prednisone tomorrow (08/30/2022).  Do not take NSAIDs with this medication including aspirin, ibuprofen/Advil, naproxen/Aleve.  You can use Tylenol/acetaminophen for additional symptom relief.  Follow-up with your primary care this week.  If you have any worsening symptoms including severe cough, shortness of breath, weakness, nausea/vomiting interfering with oral intake, chest pain you need to go to the emergency room immediately.     ED Prescriptions     Medication Sig Dispense Auth. Provider   predniSONE (STERAPRED UNI-PAK 21 TAB) 10 MG (21) TBPK tablet As directed 21 tablet Lucious Zou K, PA-C      PDMP not reviewed this encounter.   Terrilee Croak, PA-C 08/29/22 6004

## 2022-09-13 ENCOUNTER — Other Ambulatory Visit: Payer: Self-pay | Admitting: Radiology

## 2022-10-02 ENCOUNTER — Encounter (HOSPITAL_COMMUNITY): Payer: Self-pay | Admitting: *Deleted

## 2022-10-02 ENCOUNTER — Ambulatory Visit (INDEPENDENT_AMBULATORY_CARE_PROVIDER_SITE_OTHER): Payer: Medicare Other

## 2022-10-02 ENCOUNTER — Other Ambulatory Visit: Payer: Self-pay

## 2022-10-02 ENCOUNTER — Ambulatory Visit (HOSPITAL_COMMUNITY)
Admission: EM | Admit: 2022-10-02 | Discharge: 2022-10-02 | Disposition: A | Discharge status: Home / Self Care | Payer: Medicare Other | Attending: Physician Assistant | Admitting: Physician Assistant

## 2022-10-02 DIAGNOSIS — R059 Cough, unspecified: Secondary | ICD-10-CM

## 2022-10-02 DIAGNOSIS — J189 Pneumonia, unspecified organism: Secondary | ICD-10-CM | POA: Insufficient documentation

## 2022-10-02 LAB — COMPREHENSIVE METABOLIC PANEL
ALT: 47 U/L — ABNORMAL HIGH (ref 0–44)
AST: 53 U/L — ABNORMAL HIGH (ref 15–41)
Albumin: 3.6 g/dL (ref 3.5–5.0)
Alkaline Phosphatase: 98 U/L (ref 38–126)
Anion gap: 11 (ref 5–15)
BUN: 9 mg/dL (ref 8–23)
CO2: 28 mmol/L (ref 22–32)
Calcium: 9.9 mg/dL (ref 8.9–10.3)
Chloride: 101 mmol/L (ref 98–111)
Creatinine, Ser: 0.76 mg/dL (ref 0.44–1.00)
GFR, Estimated: >60 mL/min (ref >60)
Glucose, Bld: 123 mg/dL — ABNORMAL HIGH (ref 70–99)
Potassium: 3.3 mmol/L — ABNORMAL LOW (ref 3.5–5.1)
Sodium: 140 mmol/L (ref 135–145)
Total Bilirubin: 0.6 mg/dL (ref 0.3–1.2)
Total Protein: 7 g/dL (ref 6.5–8.1)

## 2022-10-02 LAB — CBC WITH DIFFERENTIAL/PLATELET
Abs Immature Granulocytes: 0.02 10*3/uL (ref 0.00–0.07)
Basophils Absolute: 0.1 10*3/uL (ref 0.0–0.1)
Basophils Relative: 1 %
Eosinophils Absolute: 0.2 10*3/uL (ref 0.0–0.5)
Eosinophils Relative: 4 %
HCT: 44.7 % (ref 36.0–46.0)
Hemoglobin: 14.9 g/dL (ref 12.0–15.0)
Immature Granulocytes: 0 %
Lymphocytes Relative: 33 %
Lymphs Abs: 1.8 10*3/uL (ref 0.7–4.0)
MCH: 31.6 pg (ref 26.0–34.0)
MCHC: 33.3 g/dL (ref 30.0–36.0)
MCV: 94.9 fL (ref 80.0–100.0)
Monocytes Absolute: 0.8 10*3/uL (ref 0.1–1.0)
Monocytes Relative: 14 %
Neutro Abs: 2.6 10*3/uL (ref 1.7–7.7)
Neutrophils Relative %: 48 %
Platelets: 201 10*3/uL (ref 150–400)
RBC: 4.71 MIL/uL (ref 3.87–5.11)
RDW: 13.3 % (ref 11.5–15.5)
WBC: 5.5 10*3/uL (ref 4.0–10.5)
nRBC: 0 % (ref 0.0–0.2)

## 2022-10-02 MED ORDER — BENZONATATE 100 MG PO CAPS: 100.0000 mg | ORAL_CAPSULE | Freq: Three times a day (TID) | ORAL | 0 refills | Status: DC

## 2022-10-02 MED ORDER — LEVOFLOXACIN 500 MG PO TABS: 500.0000 mg | ORAL_TABLET | Freq: Every day | ORAL | 0 refills | Status: DC

## 2022-10-02 MED ORDER — IPRATROPIUM-ALBUTEROL 0.5-2.5 (3) MG/3ML IN SOLN
RESPIRATORY_TRACT | Status: AC
  Filled 2022-10-02: qty 3

## 2022-10-02 MED ORDER — IPRATROPIUM-ALBUTEROL 0.5-2.5 (3) MG/3ML IN SOLN
3.0000 mL | Freq: Once | RESPIRATORY_TRACT | Status: AC
  Administered 2022-10-02: 3 mL via RESPIRATORY_TRACT

## 2022-10-02 NOTE — Discharge Instructions (Signed)
It appears you have pneumonia.  We will contact you if your lab work is abnormal.  Start Levaquin daily for 1 week.  Use Tessalon for cough.  Can use over-the-counter medications as needed for additional symptom relief.  I think you should ultimately see a pulmonologist.  Please call them to schedule an appointment.  Someone should reach out to you to schedule an appointment primary care.  If you have any worsening symptoms including worsening cough, fever, shortness of breath, chest pain, nausea, vomiting you need to go to the emergency room immediately.

## 2022-10-02 NOTE — ED Provider Notes (Signed)
[facility]    CSN: [id number] Arrival date & time: 10/02/22  1013      History   Chief Complaint Chief Complaint  Patient presents with   Cough    HPI Michele Morrison is a 66 y.o. female.   Patient presents today with a weeklong history of worsening cough.  Reports that symptoms began after he spent some time outside when it was colder.  She was seen approximately a month ago at which point she was diagnosed with pneumonia and required several courses of steroids and antibiotics.  She did have improvement of symptoms following her last prednisone taper on 08/29/2022 but never had complete resolution of symptoms.  She denies any known sick contacts.  She denies formal diagnosis of asthma or COPD but does have a family history of COPD.  She does tend to get bronchitis at least annually.  Denies additional antibiotics or steroids since her last visit with [place] a month ago.  She has been taking Promethazine DM and Tessalon with minimal improvement of symptoms.  Reports some associated nasal congestion but denies any chest pain, nausea, vomiting, diarrhea.    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,after shoulder surgery 7829   Complication of anesthesia 12/2018   awareness during surgery   Diverticulosis    H/O blood clots 1996   after a 14 hour plane flight   History of kidney stones    HTN (hypertension)    Hyperlipidemia    Kidney stones    PONV (postoperative nausea and vomiting)    Pre-diabetes     Patient Active Problem List   Diagnosis Date Noted   Abnormal Pap smear of cervix 12/24/2019    Class: Diagnosis of   Primary localized osteoarthritis of left hip 09/10/2019   Primary osteoarthritis of left hip 09/10/2019   Preop cardiovascular exam 09/02/2019   Hip pain 08/23/2019   Primary localized osteoarthritis of right knee 12/25/2018   Primary osteoarthritis  of right knee 12/25/2018   Vaginal yeast infection 12/11/2018   Acute bronchitis 04/25/2017   Preventative health care 12/21/2015   Ulcer of ankle ([place]) 05/27/2014   Other malaise and fatigue 01/01/2014   Long term (current) use of anticoagulants 12/25/2010   ESSENTIAL HYPERTENSION, BENIGN 05/03/2010   OBESITY 02/07/2008    Past Surgical History:  Procedure Laterality Date   COLONOSCOPY     JOINT REPLACEMENT Bilateral    ROTATOR CUFF REPAIR Right    TOTAL HIP ARTHROPLASTY Left 09/10/2019   Procedure: LEFT TOTAL HIP ARTHROPLASTY ANTERIOR APPROACH;  Surgeon: Melrose Nakayama, MD;  Location: [facility];  Service: Orthopedics;  Laterality: Left;   TOTAL KNEE ARTHROPLASTY Left 2012   TOTAL KNEE ARTHROPLASTY Right 12/25/2018   Procedure: TOTAL KNEE ARTHROPLASTY;  Surgeon: Melrose Nakayama, MD;  Location: [facility];  Service: Orthopedics;  Laterality: Right;    OB History     Gravida  0   Para  0   Term  0   Preterm  0   AB  0   Living  0      SAB  0   IAB  0   Ectopic  0   Multiple  0   Live Births  0            Home Medications    Prior to Admission medications   Medication Sig Start Date End Date  Taking? Authorizing Provider  benzonatate (TESSALON) 100 MG capsule Take 1 capsule (100 mg total) by mouth every 8 (eight) hours. 10/02/22  Yes Magon [name] K, PA-C  levofloxacin (LEVAQUIN) 500 MG tablet Take 1 tablet (500 mg total) by mouth daily. 10/02/22  Yes Neave Lenger, Derry Skill, PA-C  fexofenadine (ALLEGRA ALLERGY) 180 MG tablet Take 1 tablet (180 mg total) by mouth daily for 15 days. 08/15/22 08/30/22  Eliezer Lofts, FNP  hydrochlorothiazide (HYDRODIURIL) 25 MG tablet Take 1 tablet (25 mg total) by mouth daily. Please make appointment with PCP before next refill. 08/02/22   Arlyce Dice, MD  HYDROcodone bit-homatropine (HYCODAN) 5-1.5 MG/5ML syrup Take 5 mLs by mouth every 6 (six) hours as needed for cough. 08/15/22   Eliezer Lofts, FNP  nystatin ointment (MYCOSTATIN) Apply 1  Application topically 2 (two) times daily. 05/12/22   Chrzanowski, Wende Crease B, NP  predniSONE (STERAPRED UNI-PAK 21 TAB) 10 MG (21) TBPK tablet As directed 08/29/22   Yevette Knust K, PA-C  triamcinolone ointment (KENALOG) 0.5 % APPLY TO AFFECTED AREA TWICE A DAY 09/14/22   Chrzanowski, Jami B, NP  XARELTO 20 MG TABS tablet TAKE 1 TABLET BY MOUTH EVERY DAY WITH SUPPER 01/19/22   Lurline Del, DO    Family History Family History  Problem Relation Age of Onset   Colon cancer Mother 30   Coronary artery disease Mother    Heart disease Mother    Stroke Mother    Thyroid cancer Mother    Cancer Father        Non-Hodgkin's lymphoma   Coronary artery disease Father    Heart disease Father    Stroke Father     Social History Social History   Tobacco Use   Smoking status: Never   Smokeless tobacco: Never  Vaping Use   Vaping Use: Never used  Substance Use Topics   Alcohol use: Not Currently    Alcohol/week: 0.0 standard drinks of alcohol   Drug use: Never     Allergies   Latex, Cephalexin, Vancomycin, Neosporin [bacitracin-polymyxin b], Other, and Penicillins   Review of Systems Review of Systems  Constitutional:  Positive for activity change and fatigue. Negative for appetite change and fever.  HENT:  Positive for congestion, ear pain and sinus pressure. Negative for sneezing and sore throat.   Respiratory:  Positive for cough and shortness of breath.   Cardiovascular:  Negative for chest pain.  Gastrointestinal:  Negative for abdominal pain, diarrhea, nausea and vomiting.     Physical Exam Triage Vital Signs ED Triage Vitals  Enc Vitals Group     BP 10/02/22 1054 134/84     Pulse Rate 10/02/22 1054 65     Resp 10/02/22 1054 20     Temp 10/02/22 1054 98 F (36.7 C)     Temp src --      SpO2 10/02/22 1054 91 %     Weight --      Height --      Head Circumference --      Peak Flow --      Pain Score 10/02/22 1053 0     Pain Loc --      Pain Edu? --      Excl. in [place]?  --    No data found.  Updated Vital Signs BP 134/84   Pulse 65   Temp 98 F (36.7 C)   Resp 20   SpO2 94%   Visual Acuity Right Eye Distance:   Left Eye  Distance:   Bilateral Distance:    Right Eye Near:   Left Eye Near:    Bilateral Near:     Physical Exam Vitals reviewed.  Constitutional:      General: She is awake. She is not in acute distress.    Appearance: Normal appearance. She is well-developed. She is not ill-appearing.     Comments: Appears stated age in no acute distress  HENT:     Head: Normocephalic and atraumatic.     Right Ear: Ear canal and external ear normal. A middle ear effusion is present. Tympanic membrane is not erythematous or bulging.     Left Ear: Ear canal and external ear normal. A middle ear effusion is present. Tympanic membrane is not erythematous or bulging.     Nose:     Right Sinus: No maxillary sinus tenderness or frontal sinus tenderness.     Left Sinus: No maxillary sinus tenderness or frontal sinus tenderness.     Mouth/Throat:     Pharynx: Uvula midline. No oropharyngeal exudate or posterior oropharyngeal erythema.  Cardiovascular:     Rate and Rhythm: Normal rate and regular rhythm.     Heart sounds: Normal heart sounds, S1 normal and S2 normal. No murmur heard. Pulmonary:     Effort: Pulmonary effort is normal.     Breath sounds: Examination of the right-lower field reveals decreased breath sounds. Examination of the left-lower field reveals decreased breath sounds. Decreased breath sounds present. No wheezing, rhonchi or rales.     Comments: Decreased aeration bilateral bases.  Reactive cough with deep breathing. Psychiatric:        Behavior: Behavior is cooperative.      UC Treatments / Results  Labs (all labs ordered are listed, but only abnormal results are displayed) Labs Reviewed  CBC WITH DIFFERENTIAL/PLATELET  COMPREHENSIVE METABOLIC PANEL    EKG   Radiology DG Chest 2 View  Result Date:  10/02/2022 CLINICAL DATA:  Worsening cough EXAM: CHEST - 2 VIEW COMPARISON:  08/29/2022 FINDINGS: The heart size and mediastinal contours are within normal limits. Aortic atherosclerosis. Slightly low lung volumes. Patchy airspace opacity in the left lower lobe. No pleural effusion or pneumothorax. The visualized skeletal structures are unremarkable. IMPRESSION: Patchy airspace opacity in the left lower lobe, suspicious for pneumonia. Electronically Signed   By: Davina Poke D.O.   On: 10/02/2022 11:31    Procedures Procedures (including critical care time)  Medications Ordered in UC Medications  ipratropium-albuterol (DUONEB) 0.5-2.5 (3) MG/3ML nebulizer solution 3 mL (3 mLs Nebulization Given 10/02/22 1208)    Initial Impression / Assessment and Plan / UC Course  I have reviewed the triage vital signs and the nursing notes.  Pertinent labs & imaging results that were available during my care of the patient were reviewed by me and considered in my medical decision making (see chart for details).     Patient is well-appearing, afebrile, nontoxic, nontachycardic.  X-ray was obtained given history of pneumonia which showed patchy infiltrate in left lower lobe concerning for infectious process.  Patient was given DuoNeb in clinic with improvement but not resolution of symptoms.  Curb 65 score of 1 based on age only so will attempt outpatient therapy.  Given medication allergies and recent antibiotic use will use Levaquin.  Discussed that this medication can have side effects and if she has any medication side effects she should be seen immediately.  She can use Tessalon for cough.  Recommend over-the-counter medication for symptom management.  She  is to follow-up closely with her primary care.  Discussed that she will need to have repeat chest x-ray in 4 to 6 weeks to ensure resolution of pneumonia.  Given recurrent respiratory symptoms recommended that she follow-up with pulmonology and was given  contact information for a local provider with instruction to call to schedule an appointment first thing next week.  CBC and CMP were obtained today and if she has significant leukocytosis or abnormal kidney function she will need to go to the emergency room.  Discussed that if her symptoms are not improving quickly she should return for reevaluation.  If she has any worsening symptoms she must go to the ER immediately.  She return precautions given.  Work excuse note provided.  Final Clinical Impressions(s) / UC Diagnoses   Final diagnoses:  Pneumonia of left lower lobe due to infectious organism     Discharge Instructions      It appears you have pneumonia.  We will contact you if your lab work is abnormal.  Start Levaquin daily for 1 week.  Use Tessalon for cough.  Can use over-the-counter medications as needed for additional symptom relief.  I think you should ultimately see a pulmonologist.  Please call them to schedule an appointment.  Someone should reach out to you to schedule an appointment primary care.  If you have any worsening symptoms including worsening cough, fever, shortness of breath, chest pain, nausea, vomiting you need to go to the emergency room immediately.     ED Prescriptions     Medication Sig Dispense Auth. Provider   levofloxacin (LEVAQUIN) 500 MG tablet Take 1 tablet (500 mg total) by mouth daily. 7 tablet Marqus Macphee K, PA-C   benzonatate (TESSALON) 100 MG capsule Take 1 capsule (100 mg total) by mouth every 8 (eight) hours. 21 capsule Ghislaine Harcum K, PA-C      PDMP not reviewed this encounter.   Terrilee Croak, PA-C 10/02/22 1230

## 2022-10-02 NOTE — ED Triage Notes (Signed)
PT reports she feels like her PNA has returned . Pt reports a cough started on WED after she had been out side.

## 2022-10-04 ENCOUNTER — Encounter: Payer: Self-pay | Admitting: Pulmonary Disease

## 2022-10-04 ENCOUNTER — Ambulatory Visit: Payer: Medicare Other | Admitting: Pulmonary Disease

## 2022-10-04 VITALS — BP 116/72 | HR 87 | Temp 98.1°F | Ht 66.0 in | Wt 256.0 lb

## 2022-10-04 DIAGNOSIS — R052 Subacute cough: Secondary | ICD-10-CM | POA: Diagnosis not present

## 2022-10-04 MED ORDER — PREDNISONE 20 MG PO TABS: 40.0000 mg | ORAL_TABLET | Freq: Every day | ORAL | 0 refills | Status: DC

## 2022-10-04 NOTE — Progress Notes (Signed)
Michele Morrison    [id number]    1956/01/28  Primary Care Physician:Zheng, Livia Snellen, MD  Referring Physician: Arlyce Dice, [place],  [place] [address]  Chief complaint:   Patient being treated for recurrent pneumonia  HPI:  Treated for pneumonia early October Felt a little bit better but then had recurrence of symptoms Recently started on Levaquin  She has had 2 previous courses of Levaquin and steroids Steroids did help symptoms but has not really gone back to usual  She is still coughing Sputum was clear to pinkish previously now has gone green again  Did have a history with recurrent bronchitis growing up  Never smoker  No occupational predisposition to lung disease  History of high blood pressure, blood clots  Symptoms have not completely abated  No underlying history of asthma  Her mother did have COPD, was a never smoker Outpatient Encounter Medications as of 10/04/2022  Medication Sig   benzonatate (TESSALON) 100 MG capsule Take 1 capsule (100 mg total) by mouth every 8 (eight) hours.   hydrochlorothiazide (HYDRODIURIL) 25 MG tablet Take 1 tablet (25 mg total) by mouth daily. Please make appointment with PCP before next refill.   HYDROcodone bit-homatropine (HYCODAN) 5-1.5 MG/5ML syrup Take 5 mLs by mouth every 6 (six) hours as needed for cough.   levofloxacin (LEVAQUIN) 500 MG tablet Take 1 tablet (500 mg total) by mouth daily.   nystatin ointment (MYCOSTATIN) Apply 1 Application topically 2 (two) times daily.   predniSONE (STERAPRED UNI-PAK 21 TAB) 10 MG (21) TBPK tablet As directed   triamcinolone ointment (KENALOG) 0.5 % APPLY TO AFFECTED AREA TWICE A DAY   XARELTO 20 MG TABS tablet TAKE 1 TABLET BY MOUTH EVERY DAY WITH SUPPER   fexofenadine (ALLEGRA ALLERGY) 180 MG tablet Take 1 tablet (180 mg total) by mouth daily for 15 days.   No facility-administered encounter medications on file as of 10/04/2022.    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,after shoulder surgery 3810   Complication of anesthesia 12/2018   awareness during surgery   Diverticulosis    H/O blood clots 1996   after a 14 hour plane flight   History of kidney stones    HTN (hypertension)    Hyperlipidemia    Kidney stones    PONV (postoperative nausea and vomiting)    Pre-diabetes     Past Surgical History:  Procedure Laterality Date   COLONOSCOPY     JOINT REPLACEMENT Bilateral    ROTATOR CUFF REPAIR Right    TOTAL HIP ARTHROPLASTY Left 09/10/2019   Procedure: LEFT TOTAL HIP ARTHROPLASTY ANTERIOR APPROACH;  Surgeon: Melrose Nakayama, MD;  Location: [facility];  Service: Orthopedics;  Laterality: Left;   TOTAL KNEE ARTHROPLASTY Left 2012   TOTAL KNEE ARTHROPLASTY Right 12/25/2018   Procedure: TOTAL KNEE ARTHROPLASTY;  Surgeon: Melrose Nakayama, MD;  Location: [facility];  Service: Orthopedics;  Laterality: Right;    Family History  Problem Relation Age of Onset   Colon cancer Mother 63  Coronary artery disease Mother    Heart disease Mother    Stroke Mother    Thyroid cancer Mother    Cancer Father        Non-Hodgkin's lymphoma   Coronary artery disease Father    Heart disease Father    Stroke Father     Social History   Socioeconomic History   Marital status: Single    Spouse name: Not on file   Number of children: 0   Years of education: Not on file   Highest education level: Not on file  Occupational History   Occupation: Radiographer, therapeutic  Tobacco Use   Smoking status: Never   Smokeless tobacco: Never  Vaping  Use   Vaping Use: Never used  Substance and Sexual Activity   Alcohol use: Not Currently    Alcohol/week: 0.0 standard drinks of alcohol   Drug use: Never   Sexual activity: Never    Birth control/protection: Post-menopausal    Comment: never  Other Topics Concern   Not on file  Social History Narrative   Not on file   Social Determinants of Health   Financial Resource Strain: Not on file  Food Insecurity: Not on file  Transportation Needs: Not on file  Physical Activity: Not on file  Stress: Not on file  Social Connections: Not on file  Intimate Partner Violence: Not on file    Review of Systems  Constitutional:  Positive for fatigue.  Respiratory:  Positive for cough and shortness of breath.     Vitals:   10/04/22 1414  BP: 116/72  Pulse: 87  Temp: 98.1 F (36.7 C)  SpO2: 92%     Physical Exam Constitutional:      Appearance: She is obese.  HENT:     Head: Normocephalic.     Mouth/Throat:     Mouth: Mucous membranes are moist.  Eyes:     Pupils: Pupils are equal, round, and reactive to light.  Cardiovascular:     Rate and Rhythm: Normal rate and regular rhythm.     Heart sounds: No murmur heard.    No friction rub.  Pulmonary:     Effort: No respiratory distress.     Breath sounds: No stridor. No wheezing or rhonchi.  Musculoskeletal:     Cervical back: No rigidity or tenderness.  Neurological:     Mental Status: She is alert.  Psychiatric:        Mood and Affect: Mood normal.      Data Reviewed: Recent chest x-ray shows bibasal haziness, atelectasis  Assessment:  Pneumonia  Currently on Levaquin, third course of Levaquin  Cough and shortness of breath  Abnormal chest x-ray    Plan/Recommendations: Prescription for prednisone 40 daily for 5 days  Obtain IgG, IgE, IgM levels  Schedule for pulmonary function test at next visit as long as she is not having coughing and not back to usual  Continue cough medicines  Complete course  of antibiotics  We did discuss getting a pneumonia shot, RSV shot when she is feeling better  I will see her back in about 6 weeks  Sherrilyn Rist MD [facility] 10/04/2022, 2:17 PM  CC: Arlyce Dice, MD

## 2022-10-04 NOTE — Addendum Note (Signed)
Addended byOralia Rud M on: 10/04/2022 02:57 PM   Modules accepted: Orders

## 2022-10-04 NOTE — Patient Instructions (Addendum)
Sputum for Gram stain and cultures  Blood work IgG, IgM, IgE levels  PFT in 6 weeks-performed on the day of visit -May cancel PFT if not feeling well  I will see you 6 to 8 weeks from now  Prescription for prednisone will be sent to pharmacy for you

## 2022-10-05 LAB — IGG, IGA, IGM
IgG (Immunoglobin G), Serum: 1066 mg/dL (ref 600–1540)
IgM, Serum: 87 mg/dL (ref 50–300)
Immunoglobulin A: 382 mg/dL — ABNORMAL HIGH (ref 70–320)

## 2022-10-14 ENCOUNTER — Ambulatory Visit (INDEPENDENT_AMBULATORY_CARE_PROVIDER_SITE_OTHER): Payer: Medicare Other | Admitting: Family

## 2022-10-14 ENCOUNTER — Encounter: Payer: Self-pay | Admitting: Family

## 2022-10-14 VITALS — BP 144/84 | HR 65 | Temp 98.4°F | Ht 66.0 in | Wt 258.4 lb

## 2022-10-14 DIAGNOSIS — Z23 Encounter for immunization: Secondary | ICD-10-CM | POA: Diagnosis not present

## 2022-10-14 DIAGNOSIS — L609 Nail disorder, unspecified: Secondary | ICD-10-CM | POA: Diagnosis not present

## 2022-10-14 DIAGNOSIS — I10 Essential (primary) hypertension: Secondary | ICD-10-CM | POA: Diagnosis not present

## 2022-10-14 DIAGNOSIS — I82509 Chronic embolism and thrombosis of unspecified deep veins of unspecified lower extremity: Secondary | ICD-10-CM | POA: Diagnosis not present

## 2022-10-14 MED ORDER — RIVAROXABAN 20 MG PO TABS: 20.0000 mg | ORAL_TABLET | Freq: Every day | ORAL | 1 refills | Status: DC

## 2022-10-14 MED ORDER — HYDROCHLOROTHIAZIDE 12.5 MG PO TABS: 12.5000 mg | ORAL_TABLET | Freq: Every day | ORAL | 1 refills | Status: DC

## 2022-10-14 NOTE — Assessment & Plan Note (Signed)
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

## 2022-10-14 NOTE — Assessment & Plan Note (Signed)
chronic taking HCTZ '25mg'$  qd, takes later in day, denies problem with nocturia, reports she does not drink water BP slightly elevated today, but past readings in good range refilling 12.'5mg'$  dose of HCTZ, advised on working on drinking more water, starting early in day and stopping by supper follow up in 6 mos - need to check fasting lipids

## 2022-10-14 NOTE — Progress Notes (Signed)
New Patient Office Visit  Subjective:  Patient ID: Michele Morrison, female    DOB: 21-Nov-1955  Age: 66 y.o. MRN: [id number]  CC:  Chief Complaint  Patient presents with   New Patient (Initial Visit)    No concerns    HPI Michele Morrison presents for establishing care today.  Hypertension: Patient is currently maintained on the following medications for blood pressure: HCTZ Patient reports good compliance with blood pressure medications. Patient denies chest pain, headaches, shortness of breath or swelling. Last 3 blood pressure readings in our office are as follows: BP Readings from Last 3 Encounters:  10/14/22 (!) 144/84  10/04/22 116/72  10/02/22 134/84   Nail problem:  3-4 nails on each hand with dimples, discoloration, denies splitting or pain. States she started noticing about a year ago.  Assessment & Plan:   Problem List Items Addressed This Visit       Cardiovascular and Mediastinum   Essential hypertension, benign - Primary    chronic taking HCTZ '25mg'$  qd, takes later in day, denies problem with nocturia, reports she does not drink water BP slightly elevated today, but past readings in good range refilling 12.'5mg'$  dose of HCTZ, advised on working on drinking more water, starting early in day and stopping by supper follow up in 6 mos - need to check fasting lipids      Relevant Medications   hydrochlorothiazide (HYDRODIURIL) 12.5 MG tablet      Chronic deep vein thrombosis (DVT) of lower extremity ([place])    first episode as a teen d/t high dose birth control has had 2 or 3 more episodes thru the years and told she would have to be on anticoagulant for life taking Xarelto qd refilling today f/u 6 mos      Relevant Medications   hydrochlorothiazide (HYDRODIURIL) 12.5 MG tablet   rivaroxaban (XARELTO) 20 MG TABS tablet   Other Visit Diagnoses     Need for influenza vaccination       Relevant Orders   Flu Vaccine QUAD High Dose(Fluad)   Abnormality of  nail surface    - 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 Pt denies rubbing or picking at nails. Referring to dermatology.   - Ambulatory referral to Dermatology  Subjective:    Outpatient Medications Prior to Visit  Medication Sig Dispense Refill   benzonatate (TESSALON) 100 MG capsule Take 1 capsule (100 mg total) by mouth every 8 (eight) hours. 21 capsule 0   nystatin ointment (MYCOSTATIN) Apply 1 Application topically 2 (two) times daily. 30 g 0   triamcinolone ointment (KENALOG) 0.5 % APPLY TO AFFECTED AREA TWICE A DAY 30 g 0   hydrochlorothiazide (HYDRODIURIL) 25 MG tablet Take 1 tablet (25 mg total) by mouth daily. Please make appointment with PCP before next refill. 90 tablet 0   HYDROcodone bit-homatropine (HYCODAN) 5-1.5 MG/5ML syrup Take 5 mLs by mouth every 6 (six) hours as needed for cough. 120 mL 0   XARELTO 20 MG TABS tablet TAKE 1 TABLET BY MOUTH EVERY DAY WITH SUPPER 90 tablet 2   fexofenadine (ALLEGRA ALLERGY) 180 MG tablet Take 1 tablet (180 mg total) by mouth daily for 15 days. 15 tablet 0   levofloxacin (LEVAQUIN) 500 MG tablet Take 1 tablet (500 mg total) by mouth daily. (Patient not taking: Reported on 10/14/2022) 7 tablet 0   predniSONE (DELTASONE) 20 MG tablet Take 2 tablets (40 mg total) by  mouth daily with breakfast. (Patient not taking: Reported on 10/14/2022) 10 tablet 0   predniSONE (STERAPRED UNI-PAK 21 TAB) 10 MG (21) TBPK tablet As directed (Patient not taking: Reported on 10/14/2022) 21 tablet 0   No facility-administered medications prior to visit.   Past Medical History:  Diagnosis Date   Acute bronchitis 04/25/2017   Allergy    Arthritis    thumbs,spine   Clotting disorder ([place])    dvt-1996, after being placed on estrogen,after shoulder surgery 1017   Complication of anesthesia 12/2018   awareness during surgery   Diverticulosis    H/O blood clots 1996   after a 14 hour plane flight    History of kidney stones    HTN (hypertension)    Hyperlipidemia    Kidney stones    PONV (postoperative nausea and vomiting)    Pre-diabetes    Preop cardiovascular exam 09/02/2019   Patient's ability to ambulate and exert herself is limited by pain currently.  She is able to walk several blocks without dyspnea or chest pain.  This is slow due to her pain.  Her blood pressure is well controlled.  Will obtain hemoglobin P1W metabolic panel today.  EKG shows some signs of left ventricular hypertrophy which is unchanged from prior.  She denies any symptoms.  She is at increased r   Preventative health care 12/21/2015   Primary osteoarthritis of left hip 09/10/2019   Primary osteoarthritis of right knee 12/25/2018   Ulcer of ankle ([place]) 05/27/2014   Past Surgical History:  Procedure Laterality Date   COLONOSCOPY     JOINT REPLACEMENT Bilateral    ROTATOR CUFF REPAIR Right    TOTAL HIP ARTHROPLASTY Left 09/10/2019   Procedure: LEFT TOTAL HIP ARTHROPLASTY ANTERIOR APPROACH;  Surgeon: Melrose Nakayama, MD;  Location: [facility];  Service: Orthopedics;  Laterality: Left;   TOTAL KNEE ARTHROPLASTY Left 2012   TOTAL KNEE ARTHROPLASTY Right 12/25/2018   Procedure: TOTAL KNEE ARTHROPLASTY;  Surgeon: Melrose Nakayama, MD;  Location: [place] [facility];  Service: Orthopedics;  Laterality: Right;    Objective:   Today's Vitals: BP (!) 144/84 (BP Location: Left Arm, Patient Position: Sitting, Cuff Size: Large)   Pulse 65   Temp 98.4 F (36.9 C) (Temporal)   Ht '5\' 6"'$  (1.676 m)   Wt 258 lb 6.4 oz (117.2 kg)   SpO2 92%   BMI 41.71 kg/m   Physical Exam Vitals and nursing note reviewed.  Constitutional:      Appearance: Normal appearance.  Cardiovascular:     Rate and Rhythm: Normal rate and regular rhythm.  Pulmonary:     Effort: Pulmonary effort is normal.     Breath sounds: Normal breath sounds.  Musculoskeletal:        General: Normal range of motion.  Skin:    General: Skin is warm and dry.      Comments: 1st-3rd digit fingernails on both hands with dystrophic changes, mild discoloration, tan line medially on a few nails  Neurological:     Mental Status: She is alert.  Psychiatric:        Mood and Affect: Mood normal.        Behavior: Behavior normal.     Meds ordered this encounter  Medications   hydrochlorothiazide (HYDRODIURIL) 12.5 MG tablet    Sig: Take 1 tablet (12.5 mg total) by mouth daily.    Dispense:  90 tablet    Refill:  1    Patient must make appointment with PCP before next  refill.    Order Specific Question:   Supervising Provider    Answer:   ANDY, CAMILLE L [[id number]]   rivaroxaban (XARELTO) 20 MG TABS tablet    Sig: Take 1 tablet (20 mg total) by mouth daily with supper.    Dispense:  90 tablet    Refill:  1    Order Specific Question:   Supervising Provider    Answer:   ANDY, CAMILLE L [[id number]]    Jeanie Sewer, NP

## 2022-10-14 NOTE — Patient Instructions (Addendum)
Welcome to [facility], It was a pleasure meeting you today!    As discussed, I have sent your refills to your pharmacy.  I have sent a referral to Dermatology regarding your fingernails.  Please schedule a 6 month follow up visit today or sooner to check fasting labs for your cholesterol.  Have a wonderful holiday season!     PLEASE NOTE: If you had any LAB tests please let us know if you have not heard back within a few days. You may see your results on MyChart before we have a chance to review them but we will give you a call once they are reviewed by [place]. If we ordered any REFERRALS today, please let us know if you have not heard from their office within the next week.  Let us know through MyChart if you are needing REFILLS, or have your pharmacy send [place] the request. You can also use MyChart to communicate with me or any office staff.

## 2022-10-25 ENCOUNTER — Ambulatory Visit: Payer: Medicare Other | Admitting: Nurse Practitioner

## 2022-11-14 ENCOUNTER — Other Ambulatory Visit: Payer: Self-pay | Admitting: Radiology

## 2022-11-14 DIAGNOSIS — N760 Acute vaginitis: Secondary | ICD-10-CM

## 2022-11-24 ENCOUNTER — Encounter: Payer: Self-pay | Admitting: Pulmonary Disease

## 2022-11-24 ENCOUNTER — Ambulatory Visit (INDEPENDENT_AMBULATORY_CARE_PROVIDER_SITE_OTHER): Payer: Medicare Other | Admitting: Pulmonary Disease

## 2022-11-24 ENCOUNTER — Ambulatory Visit: Payer: Medicare Other | Admitting: Pulmonary Disease

## 2022-11-24 VITALS — BP 122/82 | HR 81 | Ht 65.0 in | Wt 260.0 lb

## 2022-11-24 DIAGNOSIS — R052 Subacute cough: Secondary | ICD-10-CM

## 2022-11-24 DIAGNOSIS — Z23 Encounter for immunization: Secondary | ICD-10-CM

## 2022-11-24 LAB — PULMONARY FUNCTION TEST
DL/VA % pred: 137 %
DL/VA: 5.62 ml/min/mmHg/L
DLCO cor % pred: 101 %
DLCO cor: 21.33 ml/min/mmHg
DLCO unc % pred: 101 %
DLCO unc: 21.33 ml/min/mmHg
FEF 25-75 Post: 3.59 L/sec
FEF 25-75 Pre: 2.87 L/sec
FEF2575-%Change-Post: 24 %
FEF2575-%Pred-Post: 166 %
FEF2575-%Pred-Pre: 133 %
FEV1-%Change-Post: 1 %
FEV1-%Pred-Post: 95 %
FEV1-%Pred-Pre: 94 %
FEV1-Post: 2.43 L
FEV1-Pre: 2.4 L
FEV1FVC-%Change-Post: 2 %
FEV1FVC-%Pred-Pre: 112 %
FEV6-%Change-Post: -1 %
FEV6-%Pred-Post: 85 %
FEV6-%Pred-Pre: 86 %
FEV6-Post: 2.75 L
FEV6-Pre: 2.78 L
FEV6FVC-%Pred-Post: 104 %
FEV6FVC-%Pred-Pre: 104 %
FVC-%Change-Post: -1 %
FVC-%Pred-Post: 82 %
FVC-%Pred-Pre: 83 %
FVC-Post: 2.75 L
FVC-Pre: 2.78 L
Post FEV1/FVC ratio: 88 %
Post FEV6/FVC ratio: 100 %
Pre FEV1/FVC ratio: 86 %
Pre FEV6/FVC Ratio: 100 %
RV % pred: 68 %
RV: 1.52 L
TLC % pred: 87 %
TLC: 4.68 L

## 2022-11-24 NOTE — Progress Notes (Signed)
Full PFT performed today. °

## 2022-11-24 NOTE — Progress Notes (Signed)
Michele Morrison    [id number]    October 22, 1956  Primary Care Physician:Hudnell, Colletta [place], NP  Referring Physician: Arlyce Dice, [place],  [place] [id number]  Chief complaint:   Patient being treated for recurrent pneumonia  HPI:  Treated for pneumonia early October Did have multiple recurrences of symptoms and was treated with a course of Levaquin and finally symptoms have improved significantly compared to the last time she was here  Occasional cough, no significant sputum  Shortness of breath with significant activity  Did have a history of recurrent bronchitis growing up  Never smoker  No occupational predisposition to lung disease  History of high blood pressure, blood clots  Symptoms have not completely abated  No underlying history of asthma  Her mother did have COPD, was a never smoker Outpatient Encounter Medications as of 11/24/2022  Medication Sig   benzonatate (TESSALON) 100 MG capsule Take 1 capsule (100 mg total) by mouth every 8 (eight) hours.   hydrochlorothiazide (HYDRODIURIL) 12.5 MG tablet Take 1 tablet (12.5 mg total) by mouth daily.   nystatin ointment (MYCOSTATIN) Apply 1 Application topically 2 (two) times daily.   rivaroxaban (XARELTO) 20 MG TABS tablet Take 1 tablet (20 mg total) by mouth daily with supper.   triamcinolone ointment (KENALOG) 0.5 % APPLY TO AFFECTED AREA TWICE A DAY   No facility-administered encounter medications on file as of 11/24/2022.    Allergies as of 11/24/2022 - Review Complete 11/24/2022  Allergen Reaction Noted   Latex Itching, Rash, and Other (See Comments) 09/02/2019   Cephalexin Rash 03/22/2006   Vancomycin Rash 12/25/2018   Neosporin [bacitracin-polymyxin b]  09/02/2019   Other  09/10/2019   Penicillins Other (See Comments) 04/06/2013    Past Medical History:  Diagnosis Date   Acute bronchitis 04/25/2017   Allergy    Arthritis    thumbs,spine   Clotting disorder ([place])     dvt-1996, after being placed on estrogen,after shoulder surgery 3212   Complication of anesthesia 12/2018   awareness during surgery   Diverticulosis    H/O blood clots 1996   after a 14 hour plane flight   History of kidney stones    HTN (hypertension)    Hyperlipidemia    Kidney stones    PONV (postoperative nausea and vomiting)    Pre-diabetes    Preop cardiovascular exam 09/02/2019   Patient's ability to ambulate and exert herself is limited by pain currently.  She is able to walk several blocks without dyspnea or chest pain.  This is slow due to her pain.  Her blood pressure is well controlled.  Will obtain hemoglobin Y4M metabolic panel today.  EKG shows some signs of left ventricular hypertrophy which is unchanged from prior.  She denies any symptoms.  She is at increased r   Preventative health care 12/21/2015   Primary osteoarthritis of left hip 09/10/2019   Primary osteoarthritis of right knee 12/25/2018   Ulcer of ankle ([place]) 05/27/2014    Past Surgical History:  Procedure Laterality Date   COLONOSCOPY     JOINT REPLACEMENT Bilateral    ROTATOR CUFF REPAIR Right    TOTAL HIP ARTHROPLASTY Left 09/10/2019   Procedure: LEFT TOTAL HIP ARTHROPLASTY ANTERIOR APPROACH;  Surgeon: Melrose Nakayama, MD;  Location: [facility];  Service: Orthopedics;  Laterality: Left;   TOTAL KNEE ARTHROPLASTY Left 2012   TOTAL KNEE ARTHROPLASTY Right 12/25/2018   Procedure: TOTAL KNEE ARTHROPLASTY;  Surgeon: Rhona Raider,  Collier Salina, MD;  Location: [place];  Service: Orthopedics;  Laterality: Right;    Family History  Problem Relation Age of Onset   Colon cancer Mother 32   Coronary artery disease Mother    Heart disease Mother    Stroke Mother    Thyroid cancer Mother    Cancer Father        Non-Hodgkin's lymphoma   Coronary artery disease Father    Heart disease Father    Stroke Father     Social History   Socioeconomic History   Marital status: Single    Spouse name: Not on file   Number of  children: 0   Years of education: Not on file   Highest education level: Not on file  Occupational History   Occupation: Radiographer, therapeutic  Tobacco Use   Smoking status: Never   Smokeless tobacco: Never  Vaping Use   Vaping Use: Never used  Substance and Sexual Activity   Alcohol use: Not Currently    Alcohol/week: 0.0 standard drinks of alcohol   Drug use: Never   Sexual activity: Never    Birth control/protection: Post-menopausal    Comment: never  Other Topics Concern   Not on file  Social History Narrative   Not on file   Social Determinants of Health   Financial Resource Strain: Not on file  Food Insecurity: Not on file  Transportation Needs: Not on file  Physical Activity: Not on file  Stress: Not on file  Social Connections: Not on file  Intimate Partner Violence: Not on file    Review of Systems  Constitutional:  Positive for fatigue.  Respiratory:  Negative for cough and shortness of breath.     Vitals:   11/24/22 1320  BP: 122/82  Pulse: 81  SpO2: 98%     Physical Exam Constitutional:      Appearance: She is obese.  HENT:     Head: Normocephalic.  Eyes:     General: No scleral icterus.    Pupils: Pupils are equal, round, and reactive to light.  Cardiovascular:     Rate and Rhythm: Normal rate and regular rhythm.     Heart sounds: No murmur heard.    No friction rub.  Pulmonary:     Effort: No respiratory distress.     Breath sounds: No stridor. No wheezing or rhonchi.  Musculoskeletal:     Cervical back: No rigidity or tenderness.  Neurological:     Mental Status: She is alert.  Psychiatric:        Mood and Affect: Mood normal.    Data Reviewed: Recent chest x-ray shows bibasal haziness, atelectasis  PFT today shows no obstruction, no significant bronchodilator response, no restriction with normal diffusing capacity  Assessment:  Pneumonia -Adequately treated -Resolution of symptoms  Did have multiple courses of Levaquin -Has been  stable since her last course of treatment  Cough and shortness of breath -Happens with significant exertion  Abnormal chest x-ray   Plan/Recommendations: Graded exercise as tolerated  PFT with no significant obstruction or abnormal findings  Did recently complete course of antibiotics  Pneumonia shot today  Follow-up as needed  Encouraged to call with significant concerns   Sherrilyn Rist MD [facility] 11/24/2022, 1:25 PM  CC: Arlyce Dice, MD

## 2022-11-24 NOTE — Addendum Note (Signed)
Addended by: June Leap on: 11/24/2022 03:57 PM   Modules accepted: Orders

## 2022-11-24 NOTE — Patient Instructions (Signed)
I will see you as needed  Your breathing study is within normal limits today  Try and initiate regular exercises, incorporate this into your daily activities as able  Call with significant concerns

## 2022-11-24 NOTE — Patient Instructions (Signed)
Full PFT performed today. °

## 2022-12-23 NOTE — Addendum Note (Signed)
Addended by: Burnice Logan on: 12/23/2022 10:49 AM   Modules accepted: Orders

## 2022-12-30 ENCOUNTER — Other Ambulatory Visit: Payer: Self-pay | Admitting: Orthopaedic Surgery

## 2022-12-30 DIAGNOSIS — M545 Low back pain, unspecified: Secondary | ICD-10-CM

## 2023-01-23 ENCOUNTER — Other Ambulatory Visit: Payer: Medicare Other

## 2023-02-09 ENCOUNTER — Ambulatory Visit
Admission: RE | Admit: 2023-02-09 | Discharge: 2023-02-09 | Disposition: A | Discharge status: Home / Self Care | Payer: Medicare Other | Source: Ambulatory Visit | Attending: Orthopaedic Surgery | Admitting: Orthopaedic Surgery

## 2023-02-09 DIAGNOSIS — M545 Low back pain, unspecified: Secondary | ICD-10-CM

## 2023-03-22 ENCOUNTER — Telehealth: Payer: Self-pay | Admitting: Family

## 2023-03-22 NOTE — Telephone Encounter (Signed)
Contacted Michele Morrison to schedule their annual wellness visit. Appointment made for 03/23/2023.  [facility] TEAM Direct Dial [phone]

## 2023-03-23 ENCOUNTER — Ambulatory Visit (INDEPENDENT_AMBULATORY_CARE_PROVIDER_SITE_OTHER): Payer: Medicare Other

## 2023-03-23 VITALS — Wt 260.0 lb

## 2023-03-23 DIAGNOSIS — Z0000 Encounter for general adult medical examination without abnormal findings: Secondary | ICD-10-CM

## 2023-03-23 NOTE — Progress Notes (Signed)
I connected with  Michele Morrison on 03/23/23 by a audio enabled telemedicine application and verified that I am speaking with the correct person using two identifiers.  Patient Location: Home  Provider Location: Office/Clinic  I discussed the limitations of evaluation and management by telemedicine. The patient expressed understanding and agreed to proceed.   Subjective:   Michele Morrison is a 67 y.o. female who presents for an Initial Medicare Annual Wellness Visit.  Review of Systems     Cardiac Risk Factors include: advanced age (>82men, >43 women);obesity (BMI >30kg/m2)     Objective:    Today's Vitals   03/23/23 1321  Weight: 260 lb (117.9 kg)   Body mass index is 43.27 kg/m.     03/23/2023    1:25 PM 09/10/2019   10:48 AM 09/05/2019   11:22 AM 08/23/2019    3:58 PM 12/26/2018    8:00 AM 12/19/2018    4:19 PM 12/17/2018   10:07 AM  Advanced Directives  Does Patient Have a Medical Advance Directive? Yes No No No No No No  Type of Estate agent of [place];Living will        Copy of Healthcare Power of Attorney in Chart? No - copy requested        Would patient like information on creating a medical advance directive?  No - Patient declined  No - Patient declined No - Patient declined No - Patient declined No - Patient declined    Current Medications (verified) Outpatient Encounter Medications as of 03/23/2023  Medication Sig   benzonatate (TESSALON) 100 MG capsule Take 1 capsule (100 mg total) by mouth every 8 (eight) hours.   hydrochlorothiazide (HYDRODIURIL) 12.5 MG tablet Take 1 tablet (12.5 mg total) by mouth daily.   rivaroxaban (XARELTO) 20 MG TABS tablet Take 1 tablet (20 mg total) by mouth daily with supper.   [DISCONTINUED] nystatin ointment (MYCOSTATIN) Apply 1 Application topically 2 (two) times daily.   [DISCONTINUED] triamcinolone ointment (KENALOG) 0.5 % APPLY TO AFFECTED AREA TWICE A DAY   No facility-administered encounter medications  on file as of 03/23/2023.    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  She is able to walk several blocks without dyspnea or chest pain.  This is slow due to her pain.  Her blood pressure is well controlled.  Will obtain hemoglobin A1c metabolic panel today.  EKG shows some signs of left ventricular hypertrophy which is unchanged from prior.  She denies any symptoms.  She is at increased r   Preventative health care 12/21/2015   Primary osteoarthritis of left hip 09/10/2019   Primary osteoarthritis of right knee 12/25/2018   Ulcer of ankle (HCC) 05/27/2014   Past Surgical History:  Procedure Laterality Date  COLONOSCOPY     JOINT REPLACEMENT Bilateral    ROTATOR CUFF REPAIR Right    TOTAL HIP ARTHROPLASTY Left 09/10/2019   Procedure: LEFT TOTAL HIP ARTHROPLASTY ANTERIOR APPROACH;  Surgeon: Marcene Corning, MD;  Location: [facility];  Service: Orthopedics;  Laterality: Left;   TOTAL KNEE ARTHROPLASTY Left 2012   TOTAL KNEE ARTHROPLASTY Right 12/25/2018   Procedure: TOTAL KNEE ARTHROPLASTY;  Surgeon: Marcene Corning, MD;  Location: [facility];  Service: Orthopedics;  Laterality: Right;   Family History  Problem Relation Age of Onset   Colon cancer Mother 66   Coronary artery disease Mother    Heart disease  Mother    Stroke Mother    Thyroid cancer Mother    Cancer Father        Non-Hodgkin's lymphoma   Coronary artery disease Father    Heart disease Father    Stroke Father    Social History   Socioeconomic History   Marital status: Single    Spouse name: Not on file   Number of children: 0   Years of education: Not on file   Highest education level: Not on file  Occupational History   Occupation: Research scientist (life sciences)  Tobacco Use   Smoking status: Never   Smokeless tobacco: Never  Vaping Use   Vaping Use: Never used  Substance and Sexual Activity   Alcohol use: Not Currently    Alcohol/week: 0.0 standard drinks of alcohol   Drug use: Never   Sexual activity: Never    Birth control/protection: Post-menopausal    Comment: never  Other Topics Concern   Not on file  Social History Narrative   Not on file   Social Determinants of Health   Financial Resource Strain: Low Risk  (03/23/2023)   Overall Financial Resource Strain (CARDIA)    Difficulty of Paying Living Expenses: Not hard at all  Food Insecurity: No Food Insecurity (03/23/2023)   Hunger Vital Sign    Worried About Running Out of Food in the Last Year: Never true    Ran Out of Food in the Last Year: Never true  Transportation Needs: No Transportation Needs (03/23/2023)   PRAPARE - Administrator, Civil Service (Medical): No    Lack of Transportation (Non-Medical): No  Physical Activity: Sufficiently Active (03/23/2023)   Exercise Vital Sign    Days of Exercise per Week: 4 days    Minutes of Exercise per Session: 120 min  Stress: No Stress Concern Present (03/23/2023)   [employer] of Occupational Health - Occupational Stress Questionnaire    Feeling of Stress : Not at all  Social Connections: Moderately Isolated (03/23/2023)   Social Connection and Isolation Panel [NHANES]    Frequency of Communication with Friends and Family: More than three times a week    Frequency of Social Gatherings with Friends and  Family: More than three times a week    Attends Religious Services: More than 4 times per year    Active Member of [employer] or Organizations: No    Attends Engineer, structural: Never    Marital Status: Never married    Tobacco Counseling Counseling given: Not Answered   Clinical Intake:  Pre-visit preparation completed: Yes  Pain : No/denies pain     BMI - recorded: 43.27 Nutritional Status: BMI > 30  Obese Nutritional Risks: None Diabetes: No  How often do you need to have someone help you when you read instructions, pamphlets, or other written materials from your doctor or  pharmacy?: 1 - Never  Diabetic?no  Interpreter Needed?: No  Information entered by :: Lanier Ensign, LPN   Activities of Daily Living    03/23/2023    1:26 PM  In your present state of health, do you have any difficulty performing the following activities:  Hearing? 1  Comment left ear drum affected  Vision? 0  Difficulty concentrating or making decisions? 0  Walking or climbing stairs? 0  Dressing or bathing? 0  Doing errands, shopping? 0  Preparing Food and eating ? N  Using the Toilet? N  In the past six months, have you accidently leaked urine? N  Do you have problems with loss of bowel control? N  Managing your Medications? N  Managing your Finances? N  Housekeeping or managing your Housekeeping? N    Patient Care Team: Dulce Sellar, NP as PCP - General (Family Medicine)  Indicate any recent Medical Services you may have received from other than [facility] providers in the past year (date may be approximate).     Assessment:   This is a routine wellness examination for Aleja.  Hearing/Vision screen Hearing Screening - Comments:: Left ear unable to hear  Vision Screening - Comments:: Encouraged to follow up with provider was seeing [facility] for annual eye exams   Dietary issues and exercise activities discussed: Current Exercise Habits: Home exercise routine, Type of  exercise: Other - see comments (yARD WORK), Time (Minutes): > 60, Frequency (Times/Week): 4, Weekly Exercise (Minutes/Week): 0   Goals Addressed             This Visit's Progress    Patient Stated       Lose weight        Depression Screen    03/23/2023    1:23 PM 10/14/2022   10:19 AM 12/19/2018    4:19 PM 12/25/2017    9:37 AM 04/25/2017    9:02 AM 12/21/2015    1:41 PM 12/08/2014    2:03 PM  PHQ 2/9 Scores  PHQ - 2 Score 0 0 0 0 0 0 0    Fall Risk    03/23/2023    1:26 PM 10/14/2022   10:19 AM 12/25/2017    9:37 AM 12/21/2015    1:41 PM 06/03/2014    3:15 PM  Fall Risk   Falls in the past year? 0 0 No No No  Number falls in past yr: 0 0     Injury with Fall? 0 0     Risk for fall due to : Impaired vision No Fall Risks     Follow up Falls prevention discussed Falls evaluation completed;Education provided       FALL RISK PREVENTION PERTAINING TO THE HOME:  Any stairs in or around the home? Yes  If so, are there any without handrails? No  Home free of loose throw rugs in walkways, pet beds, electrical cords, etc? Yes  Adequate lighting in your home to reduce risk of falls? Yes   ASSISTIVE DEVICES UTILIZED TO PREVENT FALLS:  Life alert? No  Use of a cane, walker or w/c? No  Grab bars in the bathroom? No  Shower chair or bench in shower? No  Elevated toilet seat or a handicapped toilet? No   TIMED UP AND GO:  Was the test performed? No .   Cognitive Function:        03/23/2023    1:27 PM  6CIT Screen  What Year? 0 points  What month? 0 points  What time? 0 points  Count back from 20 0 points  Months in reverse 0 points  Repeat phrase 0 points  Total Score 0 points    Immunizations Immunization History  Administered Date(s) Administered   Fluad Quad(high Dose 65+) 10/14/2022   Influenza Whole 09/10/2008   Influenza,inj,Quad PF,6+ Mos 12/22/2016   PFIZER(Purple Top)SARS-COV-2 Vaccination 02/07/2020, 03/04/2020   PNEUMOCOCCAL CONJUGATE-20 11/24/2022    Tdap 04/04/2013    TDAP status: Due, Education has been provided regarding the importance of this vaccine. Advised may receive this vaccine at local pharmacy or Health Dept. Aware to provide a copy of the vaccination record if obtained from local pharmacy or Health Dept. Verbalized acceptance and understanding.  Flu Vaccine status: Up to date  Pneumococcal vaccine status: Up to date  Covid-19 vaccine status: Completed vaccines  Qualifies for Shingles Vaccine? Yes   Zostavax completed No   Shingrix Completed?: No.    Education has been provided regarding the importance of this vaccine. Patient has been advised to call insurance company to determine out of pocket expense if they have not yet received this vaccine. Advised may also receive vaccine at local pharmacy or Health Dept. Verbalized acceptance and understanding.  Screening Tests Health Maintenance  Topic Date Due   Zoster Vaccines- Shingrix (1 of 2) Never done   MAMMOGRAM  03/22/2024 (Originally 02/26/2014)   DEXA SCAN  03/22/2024 (Originally 10/01/2020)   DTaP/Tdap/Td (2 - Td or Tdap) 04/05/2023   INFLUENZA VACCINE  06/15/2023   COLONOSCOPY (Pts 45-75yrs Insurance coverage will need to be confirmed)  08/21/2023   Medicare Annual Wellness (AWV)  03/22/2024   Pneumonia Vaccine 55+ Years old  Completed   Hepatitis C Screening  Completed   HPV VACCINES  Aged Out   COVID-19 Vaccine  Discontinued    Health Maintenance  Health Maintenance Due  Topic Date Due   Zoster Vaccines- Shingrix (1 of 2) Never done    Colorectal cancer screening: Type of screening: Colonoscopy. Completed 08/20/18. Repeat every 5 years  Mammogram status: Completed 02/27/12. Repeat every year     Additional Screening:  Hepatitis C Screening: Completed 12/08/14  Vision Screening: Recommended annual ophthalmology exams for early detection of glaucoma and other disorders of the eye. Is the patient up to date with their annual eye exam?  Yes  Who is  the provider or what is the name of the office in which the patient attends annual eye exams? Will find a new provider previously with [facility]  If pt is not established with a provider, would they like to be referred to a provider to establish care? No .   Dental Screening: Recommended annual dental exams for proper oral hygiene  Community Resource Referral / Chronic Care Management: CRR required this visit?  No   CCM required this visit?  No      Plan:     I have personally reviewed and noted the following in the patient's chart:   Medical and social history Use of alcohol, tobacco or illicit drugs  Current medications and supplements including opioid prescriptions. Patient is not currently taking opioid prescriptions. Functional ability and status Nutritional status Physical activity Advanced directives List of other physicians Hospitalizations, surgeries, and ER visits in previous 12 months Vitals Screenings to include cognitive, depression, and falls Referrals and appointments  In addition, I have reviewed and discussed with patient certain preventive protocols, quality metrics, and best practice recommendations. A written personalized care plan for preventive services as well as general preventive  health recommendations were provided to patient.     Marzella Schlein, LPN   11/19/1094   Nurse Notes: none

## 2023-03-23 NOTE — Patient Instructions (Signed)
Michele Morrison , Thank you for taking time to come for your Medicare Wellness Visit. I appreciate your ongoing commitment to your health goals. Please review the following plan we discussed and let me know if I can assist you in the future.   These are the goals we discussed:  Goals      Patient Stated     Lose weight         This is a list of the screening recommended for you and due dates:  Health Maintenance  Topic Date Due   Zoster (Shingles) Vaccine (1 of 2) Never done   Mammogram  02/26/2014   DEXA scan (bone density measurement)  Never done   DTaP/Tdap/Td vaccine (2 - Td or Tdap) 04/05/2023   Flu Shot  06/15/2023   Colon Cancer Screening  08/21/2023   Medicare Annual Wellness Visit  03/22/2024   Pneumonia Vaccine  Completed   Hepatitis C Screening: USPSTF Recommendation to screen - Ages 18-79 yo.  Completed   HPV Vaccine  Aged Out   COVID-19 Vaccine  Discontinued    Advanced directives: Please bring a copy of your health care power of attorney and living will to the office at your convenience.  Conditions/risks identified: lose weight   Next appointment: Follow up in one year for your annual wellness visit    Preventive Care 68 Years and Older, Female Preventive care refers to lifestyle choices and visits with your health care provider that can promote health and wellness. What does preventive care include? A yearly physical exam. This is also called an annual well check. Dental exams once or twice a year. Routine eye exams. Ask your health care provider how often you should have your eyes checked. Personal lifestyle choices, including: Daily care of your teeth and gums. Regular physical activity. Eating a healthy diet. Avoiding tobacco and drug use. Limiting alcohol use. Practicing safe sex. Taking low-dose aspirin every day. Taking vitamin and mineral supplements as recommended by your health care provider. What happens during an annual well check? The services  and screenings done by your health care provider during your annual well check will depend on your age, overall health, lifestyle risk factors, and family history of disease. Counseling  Your health care provider may ask you questions about your: Alcohol use. Tobacco use. Drug use. Emotional well-being. Home and relationship well-being. Sexual activity. Eating habits. History of falls. Memory and ability to understand (cognition). Work and work Astronomer. Reproductive health. Screening  You may have the following tests or measurements: Height, weight, and BMI. Blood pressure. Lipid and cholesterol levels. These may be checked every 5 years, or more frequently if you are over 5 years old. Skin check. Lung cancer screening. You may have this screening every year starting at age 68 if you have a 30-pack-year history of smoking and currently smoke or have quit within the past 15 years. Fecal occult blood test (FOBT) of the stool. You may have this test every year starting at age 68. Flexible sigmoidoscopy or colonoscopy. You may have a sigmoidoscopy every 5 years or a colonoscopy every 10 years starting at age 73. Hepatitis C blood test. Hepatitis B blood test. Sexually transmitted disease (STD) testing. Diabetes screening. This is done by checking your blood sugar (glucose) after you have not eaten for a while (fasting). You may have this done every 1-3 years. Bone density scan. This is done to screen for osteoporosis. You may have this done starting at age 68. Mammogram. This may  be done every 1-2 years. Talk to your health care provider about how often you should have regular mammograms. Talk with your health care provider about your test results, treatment options, and if necessary, the need for more tests. Vaccines  Your health care provider may recommend certain vaccines, such as: Influenza vaccine. This is recommended every year. Tetanus, diphtheria, and acellular pertussis  (Tdap, Td) vaccine. You may need a Td booster every 10 years. Zoster vaccine. You may need this after age 68. Pneumococcal 13-valent conjugate (PCV13) vaccine. One dose is recommended after age 68. Pneumococcal polysaccharide (PPSV23) vaccine. One dose is recommended after age 5. Talk to your health care provider about which screenings and vaccines you need and how often you need them. This information is not intended to replace advice given to you by your health care provider. Make sure you discuss any questions you have with your health care provider. Document Released: 11/27/2015 Document Revised: 07/20/2016 Document Reviewed: 09/01/2015 Elsevier Interactive Patient Education  2017 [employer] Prevention in the Home Falls can cause injuries. They can happen to people of all ages. There are many things you can do to make your home safe and to help prevent falls. What can I do on the outside of my home? Regularly fix the edges of walkways and driveways and fix any cracks. Remove anything that might make you trip as you walk through a door, such as a raised step or threshold. Trim any bushes or trees on the path to your home. Use bright outdoor lighting. Clear any walking paths of anything that might make someone trip, such as rocks or tools. Regularly check to see if handrails are loose or broken. Make sure that both sides of any steps have handrails. Any raised decks and porches should have guardrails on the edges. Have any leaves, snow, or ice cleared regularly. Use sand or salt on walking paths during winter. Clean up any spills in your garage right away. This includes oil or grease spills. What can I do in the bathroom? Use night lights. Install grab bars by the toilet and in the tub and shower. Do not use towel bars as grab bars. Use non-skid mats or decals in the tub or shower. If you need to sit down in the shower, use a plastic, non-slip stool. Keep the floor dry. Clean  up any water that spills on the floor as soon as it happens. Remove soap buildup in the tub or shower regularly. Attach bath mats securely with double-sided non-slip rug tape. Do not have throw rugs and other things on the floor that can make you trip. What can I do in the bedroom? Use night lights. Make sure that you have a light by your bed that is easy to reach. Do not use any sheets or blankets that are too big for your bed. They should not hang down onto the floor. Have a firm chair that has side arms. You can use this for support while you get dressed. Do not have throw rugs and other things on the floor that can make you trip. What can I do in the kitchen? Clean up any spills right away. Avoid walking on wet floors. Keep items that you use a lot in easy-to-reach places. If you need to reach something above you, use a strong step stool that has a grab bar. Keep electrical cords out of the way. Do not use floor polish or wax that makes floors slippery. If you must use  wax, use non-skid floor wax. Do not have throw rugs and other things on the floor that can make you trip. What can I do with my stairs? Do not leave any items on the stairs. Make sure that there are handrails on both sides of the stairs and use them. Fix handrails that are broken or loose. Make sure that handrails are as long as the stairways. Check any carpeting to make sure that it is firmly attached to the stairs. Fix any carpet that is loose or worn. Avoid having throw rugs at the top or bottom of the stairs. If you do have throw rugs, attach them to the floor with carpet tape. Make sure that you have a light switch at the top of the stairs and the bottom of the stairs. If you do not have them, ask someone to add them for you. What else can I do to help prevent falls? Wear shoes that: Do not have high heels. Have rubber bottoms. Are comfortable and fit you well. Are closed at the toe. Do not wear sandals. If you  use a stepladder: Make sure that it is fully opened. Do not climb a closed stepladder. Make sure that both sides of the stepladder are locked into place. Ask someone to hold it for you, if possible. Clearly mark and make sure that you can see: Any grab bars or handrails. First and last steps. Where the edge of each step is. Use tools that help you move around (mobility aids) if they are needed. These include: Canes. Walkers. Scooters. Crutches. Turn on the lights when you go into a dark area. Replace any light bulbs as soon as they burn out. Set up your furniture so you have a clear path. Avoid moving your furniture around. If any of your floors are uneven, fix them. If there are any pets around you, be aware of where they are. Review your medicines with your doctor. Some medicines can make you feel dizzy. This can increase your chance of falling. Ask your doctor what other things that you can do to help prevent falls. This information is not intended to replace advice given to you by your health care provider. Make sure you discuss any questions you have with your health care provider. Document Released: 08/27/2009 Document Revised: 04/07/2016 Document Reviewed: 12/05/2014 Elsevier Interactive Patient Education  2017 [employer].

## 2023-04-07 ENCOUNTER — Other Ambulatory Visit: Payer: Self-pay | Admitting: Family

## 2023-04-07 DIAGNOSIS — I10 Essential (primary) hypertension: Secondary | ICD-10-CM

## 2023-05-13 ENCOUNTER — Other Ambulatory Visit: Payer: Self-pay | Admitting: Family

## 2023-05-13 DIAGNOSIS — I10 Essential (primary) hypertension: Secondary | ICD-10-CM

## 2023-08-06 ENCOUNTER — Ambulatory Visit (HOSPITAL_COMMUNITY)
Admission: EM | Admit: 2023-08-06 | Discharge: 2023-08-06 | Disposition: A | Discharge status: Home / Self Care | Payer: Medicare Other | Attending: Emergency Medicine | Admitting: Emergency Medicine

## 2023-08-06 ENCOUNTER — Encounter (HOSPITAL_COMMUNITY): Payer: Self-pay

## 2023-08-06 DIAGNOSIS — H00015 Hordeolum externum left lower eyelid: Secondary | ICD-10-CM

## 2023-08-06 MED ORDER — ERYTHROMYCIN 5 MG/GM OP OINT: TOPICAL_OINTMENT | Freq: Two times a day (BID) | OPHTHALMIC | 0 refills | Status: DC

## 2023-08-06 NOTE — Discharge Instructions (Addendum)
Please use the ointment twice daily for the next 4-5 days Apply a thin ribbon inside the lower eyelid and blink it across the eye You can also apply warm compress to the eyelid for 10-15 minutes at a time, several times daily There is more information about styes attached  Please continue taking your blood pressure medication as prescribed.   Return with any concerns

## 2023-08-06 NOTE — ED Provider Notes (Signed)
[facility]    CSN: [id number] Arrival date & time: 08/06/23  1001     History   Chief Complaint Chief Complaint  Patient presents with   Eye Pain    HPI Michele Morrison is a 67 y.o. female.  Presents with left lower eyelid concern There was a small bump for 1-2 days that was slightly tender. This morning she woke up with some lower eyelid swelling although not painful. Not having eye pain, discharge, fever. No history of eye issues. Wears glasses  Past Medical History:  Diagnosis Date   Acute bronchitis 04/25/2017   Allergy    Arthritis    thumbs,spine   Clotting disorder (HCC)    dvt-1996, after being placed on estrogen,after shoulder surgery 2007   Complication of anesthesia 12/2018   awareness during surgery   Diverticulosis    H/O blood clots 1996   after a 14 hour plane flight   History of kidney stones    HTN (hypertension)    Hyperlipidemia    Kidney stones    PONV (postoperative nausea and vomiting)    Pre-diabetes    Preop cardiovascular exam 09/02/2019   Patient's ability to ambulate and exert herself is limited by pain currently.  She is able to walk several blocks without dyspnea or chest pain.  This is slow due to her pain.  Her blood pressure is well controlled.  Will obtain hemoglobin A1c metabolic panel today.  EKG shows some signs of left ventricular hypertrophy which is unchanged from prior.  She denies any symptoms.  She is at increased r   Preventative health care 12/21/2015   Primary osteoarthritis of left hip 09/10/2019   Primary osteoarthritis of right knee 12/25/2018   Ulcer of ankle (HCC) 05/27/2014    Patient Active Problem List   Diagnosis Date Noted   Chronic deep vein thrombosis (DVT) of lower extremity (HCC) 10/14/2022   Abnormal Pap smear of cervix 12/24/2019    Class: Diagnosis of   Primary localized osteoarthritis of left hip 09/10/2019   Hip pain 08/23/2019   Primary localized osteoarthritis of right knee 12/25/2018    Long term (current) use of anticoagulants 12/25/2010   Essential hypertension, benign 05/03/2010   OBESITY 02/07/2008    Past Surgical History:  Procedure Laterality Date   COLONOSCOPY     JOINT REPLACEMENT Bilateral    ROTATOR CUFF REPAIR Right    TOTAL HIP ARTHROPLASTY Left 09/10/2019   Procedure: LEFT TOTAL HIP ARTHROPLASTY ANTERIOR APPROACH;  Surgeon: Marcene Corning, MD;  Location: [facility];  Service: Orthopedics;  Laterality: Left;   TOTAL KNEE ARTHROPLASTY Left 2012   TOTAL KNEE ARTHROPLASTY Right 12/25/2018   Procedure: TOTAL KNEE ARTHROPLASTY;  Surgeon: Marcene Corning, MD;  Location: [facility];  Service: Orthopedics;  Laterality: Right;    OB History     Gravida  0   Para  0   Term  0   Preterm  0   AB  0   Living  0      SAB  0   IAB  0   Ectopic  0   Multiple  0   Live Births  0            Home Medications    Prior to Admission medications   Medication Sig Start Date End Date Taking? Authorizing Provider  erythromycin ophthalmic ointment Place into the left eye in the morning and at bedtime. Place a 1/2 inch ribbon of ointment into the lower  eyelid. 08/06/23  Yes Shawndra Clute, Lurena Joiner, PA-C  hydrochlorothiazide (HYDRODIURIL) 12.5 MG tablet TAKE 1 TABLET BY MOUTH EVERY DAY 04/11/23  Yes Hudnell, Judeth Cornfield, NP  XARELTO 20 MG TABS tablet TAKE 1 TABLET BY MOUTH DAILY WITH SUPPER. 05/15/23  Yes Dulce Sellar, NP    Family History Family History  Problem Relation Age of Onset   Colon cancer Mother 83   Coronary artery disease Mother    Heart disease Mother    Stroke Mother    Thyroid cancer Mother    Cancer Father        Non-Hodgkin's lymphoma   Coronary artery disease Father    Heart disease Father    Stroke Father     Social History Social History   Tobacco Use   Smoking status: Never   Smokeless tobacco: Never  Vaping Use   Vaping status: Never Used  Substance Use Topics   Alcohol use: Not Currently    Alcohol/week: 0.0 standard  drinks of alcohol   Drug use: Never     Allergies   Latex, Cephalexin, Vancomycin, Neosporin [bacitracin-polymyxin b], Other, and Penicillins   Review of Systems Review of Systems Per HPI  Physical Exam Triage Vital Signs ED Triage Vitals [08/06/23 1017]  Encounter Vitals Group     BP (!) 184/83     Systolic BP Percentile      Diastolic BP Percentile      Pulse Rate 66     Resp 18     Temp 98.1 F (36.7 C)     Temp Source Oral     SpO2 93 %     Weight      Height      Head Circumference      Peak Flow      Pain Score      Pain Loc      Pain Education      Exclude from Growth Chart    No data found.  Updated Vital Signs BP (!) 155/81   Pulse (!) 53   Temp 98.1 F (36.7 C) (Oral)   Resp 18   SpO2 93%    Physical Exam Vitals and nursing note reviewed.  Constitutional:      General: She is not in acute distress.    Appearance: She is not ill-appearing.  Eyes:     General: Lids are normal. Lids are everted, no foreign bodies appreciated. Vision grossly intact. Gaze aligned appropriately.        Left eye: Hordeolum present.No foreign body or discharge.     Extraocular Movements: Extraocular movements intact.     Right eye: Normal extraocular motion.     Conjunctiva/sclera: Conjunctivae normal.     Right eye: Right conjunctiva is not injected. No exudate.    Pupils: Pupils are equal, round, and reactive to light.     Comments: Stye on left lower lid. Mild erythema of the lower lid. No pain with EOM. Conjunctiva normal without injection or discharge. No periorbital swelling, non tender to palpation   Cardiovascular:     Rate and Rhythm: Normal rate and regular rhythm.  Pulmonary:     Effort: Pulmonary effort is normal.  Musculoskeletal:     Cervical back: Normal range of motion.  Skin:    General: Skin is warm and dry.  Neurological:     Mental Status: She is alert and oriented to person, place, and time.     UC Treatments / Results  Labs (all labs  ordered are listed,  but only abnormal results are displayed) Labs Reviewed - No data to display  EKG  Radiology No results found.  Procedures Procedures (including critical care time)  Medications Ordered in UC Medications - No data to display  Initial Impression / Assessment and Plan / UC Course  I have reviewed the triage vital signs and the nursing notes.  Pertinent labs & imaging results that were available during my care of the patient were reviewed by me and considered in my medical decision making (see chart for details).  Stye No red flags, no concern for preseptal cellulitis or deeper infection Erythromycin ointment BID, warm compress Discussed etiology and prognosis of stye, provided further information in AVS. All questions answered  Hx HTN. Elevated BP today, improved on recheck  She was worried about her symptoms and this may be contributing Takes HCTZ daily. Not having headache, vision changes, dizziness, chest pain, shortness of breath, abd pain, weakness Will continue meds and follow up with PCP. ED precautions   Final Clinical Impressions(s) / UC Diagnoses   Final diagnoses:  Hordeolum externum of left lower eyelid     Discharge Instructions      Please use the ointment twice daily for the next 4-5 days Apply a thin ribbon inside the lower eyelid and blink it across the eye You can also apply warm compress to the eyelid for 10-15 minutes at a time, several times daily There is more information about styes attached  Please continue taking your blood pressure medication as prescribed.   Return with any concerns      ED Prescriptions     Medication Sig Dispense Auth. Provider   erythromycin ophthalmic ointment Place into the left eye in the morning and at bedtime. Place a 1/2 inch ribbon of ointment into the lower eyelid. 3.5 g Rudolph Dobler, Lurena Joiner, PA-C      PDMP not reviewed this encounter.   Mady Oubre, Lurena Joiner, [place] 08/06/23 1057

## 2023-08-06 NOTE — ED Triage Notes (Signed)
Pt is here for left eye pain that started this morning.

## 2023-10-05 ENCOUNTER — Encounter: Payer: Self-pay | Admitting: Internal Medicine

## 2023-10-06 ENCOUNTER — Other Ambulatory Visit: Payer: Self-pay | Admitting: Family

## 2023-10-06 DIAGNOSIS — I10 Essential (primary) hypertension: Secondary | ICD-10-CM

## 2023-11-23 ENCOUNTER — Other Ambulatory Visit: Payer: Self-pay | Admitting: Family

## 2023-11-23 DIAGNOSIS — I10 Essential (primary) hypertension: Secondary | ICD-10-CM

## 2023-11-24 ENCOUNTER — Other Ambulatory Visit: Payer: Self-pay | Admitting: Family

## 2023-11-24 DIAGNOSIS — I10 Essential (primary) hypertension: Secondary | ICD-10-CM

## 2023-11-24 NOTE — Telephone Encounter (Signed)
 Copied from CRM [id number]. Topic: Clinical - Prescription Issue >> Nov 24, 2023  8:19 AM Antonio DEL wrote: Reason for CRM: Patient is needing a refill on her xarelto , refill request was submitted yesterday but is pending. Patient only has 3 left. She did say she was told by her pharmacy she needed an appointment to get refill. It is showing she has 1 refill left and I did schedule her for Monday.

## 2023-11-27 ENCOUNTER — Ambulatory Visit (INDEPENDENT_AMBULATORY_CARE_PROVIDER_SITE_OTHER): Payer: Medicare Other | Admitting: Family

## 2023-11-27 ENCOUNTER — Encounter: Payer: Self-pay | Admitting: Family

## 2023-11-27 VITALS — BP 159/83 | HR 65 | Temp 98.0°F | Ht 65.0 in | Wt 263.6 lb

## 2023-11-27 DIAGNOSIS — Z6841 Body Mass Index (BMI) 40.0 and over, adult: Secondary | ICD-10-CM | POA: Diagnosis not present

## 2023-11-27 DIAGNOSIS — I10 Essential (primary) hypertension: Secondary | ICD-10-CM | POA: Diagnosis not present

## 2023-11-27 DIAGNOSIS — I825Z9 Chronic embolism and thrombosis of unspecified deep veins of unspecified distal lower extremity: Secondary | ICD-10-CM | POA: Diagnosis not present

## 2023-11-27 LAB — COMPREHENSIVE METABOLIC PANEL
ALT: 29 U/L (ref 0–35)
AST: 26 U/L (ref 0–37)
Albumin: 4.1 g/dL (ref 3.5–5.2)
Alkaline Phosphatase: 114 U/L (ref 39–117)
BUN: 17 mg/dL (ref 6–23)
CO2: 30 meq/L (ref 19–32)
Calcium: 10.2 mg/dL (ref 8.4–10.5)
Chloride: 101 meq/L (ref 96–112)
Creatinine, Ser: 0.73 mg/dL (ref 0.40–1.20)
GFR: 84.66 mL/min (ref >60.00)
Glucose, Bld: 111 mg/dL — ABNORMAL HIGH (ref 70–99)
Potassium: 3.8 meq/L (ref 3.5–5.1)
Sodium: 138 meq/L (ref 135–145)
Total Bilirubin: 0.6 mg/dL (ref 0.2–1.2)
Total Protein: 7.4 g/dL (ref 6.0–8.3)

## 2023-11-27 LAB — LIPID PANEL
Cholesterol: 191 mg/dL (ref 0–200)
HDL: 37.7 mg/dL — ABNORMAL LOW (ref >39.00)
LDL Cholesterol: 120 mg/dL — ABNORMAL HIGH (ref 0–99)
NonHDL: 153.62
Total CHOL/HDL Ratio: 5
Triglycerides: 167 mg/dL — ABNORMAL HIGH (ref 0.0–149.0)
VLDL: 33.4 mg/dL (ref 0.0–40.0)

## 2023-11-27 MED ORDER — RIVAROXABAN 20 MG PO TABS: 20.0000 mg | ORAL_TABLET | Freq: Every day | ORAL | 1 refills | Status: DC

## 2023-11-27 MED ORDER — HYDROCHLOROTHIAZIDE 12.5 MG PO TABS
12.5000 mg | ORAL_TABLET | Freq: Every day | ORAL | Status: DC
Start: 2023-11-27 — End: 2024-04-04

## 2023-11-27 NOTE — Assessment & Plan Note (Signed)
 BP elevated today on Hydrochlorothiazide 12.5mg  qam. Pt reports not taking yet today. -Continue Hydrochlorothiazide as prescribed. Discussed adding Losartan. -Check CMP, Lipids today. -F/U in 6 mos

## 2023-11-27 NOTE — Assessment & Plan Note (Signed)
 Chronic, Stable on Xarelto 20mg  qd. No recent changes in medication. -Continue Xarelto as prescribed, sending refill -F/U in 6mos

## 2023-11-27 NOTE — Progress Notes (Signed)
 Patient ID: Michele Morrison, female    DOB: 1956-02-11, 68 y.o.   MRN: [id number]  Chief Complaint  Patient presents with   Med Management - DVT    Medication refill       Discussed the use of AI scribe software for clinical note transcription with the patient, who gave verbal consent to proceed.  History of Present Illness   The patient, with a history of hypertension and deep vein thrombosis (DVT), presents for medication refills. She has been taking Xarelto  for DVT and hydrochlorothiazide  for hypertension. She was last seen a year ago and has been trying to stay healthy and lose weight. She reports difficulty losing weight, especially during the winter months and holidays due to increased consumption of carbohydrates and sugars. She has considered medical weight loss options as her niece is taking Ozempic but having SE & pt worries about possible SE as well as if the meds are successful long term. She is currently looking for a job and a house, which may be contributing to her stress levels.     Assessment & Plan:     Hypertension - Chronic; BP elevated today on Hydrochlorothiazide  12.5mg  qam. Pt reports not taking yet today. -Continue Hydrochlorothiazide  as prescribed. -Check CMP, Lipids today. -F/U in 6 mos  Deep Vein Thrombosis - Chronic, Stable on Xarelto  20mg  qd. No recent changes in medication. -Continue Xarelto  as prescribed, sending refill -F/U in 6mos  Morbid Obesity/Weight Management - Chronic; Patient reports difficulty losing weight, particularly during the winter months. Discussed the benefits of a balanced diet and regular exercise. Patient declined referral to a nutritionist or medical weight loss clinic today. -Encouraged patient to continue efforts to maintain a healthy diet and regular exercise. -Consider referral to a nutritionist or medical weight loss clinic if patient's circumstances change.     Subjective:    Outpatient Medications Prior to Visit   Medication Sig Dispense Refill   hydrochlorothiazide  (HYDRODIURIL ) 12.5 MG tablet TAKE 1 TABLET BY MOUTH EVERY DAY 90 tablet 1   XARELTO  20 MG TABS tablet TAKE 1 TABLET BY MOUTH DAILY WITH SUPPER. 90 tablet 1   erythromycin  ophthalmic ointment Place into the left eye in the morning and at bedtime. Place a 1/2 inch ribbon of ointment into the lower eyelid. (Patient not taking: Reported on 11/27/2023) 3.5 g 0   No facility-administered medications prior to visit.   Past Medical History:  Diagnosis Date   Acute bronchitis 04/25/2017   Allergy    Arthritis    thumbs,spine   Clotting disorder (HCC)    dvt-1996, after being placed on estrogen,after shoulder surgery 2007   Complication of anesthesia 12/2018   awareness during surgery   Diverticulosis    H/O blood clots 1996   after a 14 hour plane flight   History of kidney stones    HTN (hypertension)    Hyperlipidemia    Kidney stones    PONV (postoperative nausea and vomiting)    Pre-diabetes    Preop cardiovascular exam 09/02/2019   Patient's ability to ambulate and exert herself is limited by pain currently.  She is able to walk several blocks without dyspnea or chest pain.  This is slow due to her pain.  Her blood pressure is well controlled.  Will obtain hemoglobin A1c metabolic panel today.  EKG shows some signs of left ventricular hypertrophy which is unchanged from prior.  She denies any symptoms.  She is at increased r   Preventative health care 12/21/2015  Primary osteoarthritis of left hip 09/10/2019   Primary osteoarthritis of right knee 12/25/2018   Ulcer of ankle (HCC) 05/27/2014   Past Surgical History:  Procedure Laterality Date   COLONOSCOPY     JOINT REPLACEMENT Bilateral    ROTATOR CUFF REPAIR Right    TOTAL HIP ARTHROPLASTY Left 09/10/2019   Procedure: LEFT TOTAL HIP ARTHROPLASTY ANTERIOR APPROACH;  Surgeon: Sheril Coy, MD;  Location: [facility];  Service: Orthopedics;  Laterality: Left;   TOTAL KNEE  ARTHROPLASTY Left 2012   TOTAL KNEE ARTHROPLASTY Right 12/25/2018   Procedure: TOTAL KNEE ARTHROPLASTY;  Surgeon: Sheril Coy, MD;  Location: [facility];  Service: Orthopedics;  Laterality: Right;   Allergies  Allergen Reactions   Latex Itching, Rash and Other (See Comments)    Severe Blisters   Cephalexin Rash   Vancomycin  Rash    Received during knee surgery 12/25/18, developed flushed skin and rash, resolved with administration of benadryl    Neosporin [Bacitracin-Polymyxin B]     Blisters    Other     any over the counter topical creams   Penicillins Other (See Comments)    Did it involve swelling of the face/tongue/throat, SOB, or low BP? Unknown-Pt NEVER taken  Did it involve sudden or severe rash/hives, skin peeling, or any reaction on the inside of your mouth or nose? Unknown pt NEVER taken  Did you need to seek medical attention at a hospital or doctor's office? Unknown-pt NEVER taken  When did it last happen? pt NEVER taken this medication       If all above answers are NO, may proceed with cephalosporin use. Mother had a reaction while pregnant with pt.       Objective:    Physical Exam Vitals and nursing note reviewed.  Constitutional:      Appearance: Normal appearance. She is obese.  Cardiovascular:     Rate and Rhythm: Normal rate and regular rhythm.  Pulmonary:     Effort: Pulmonary effort is normal.     Breath sounds: Normal breath sounds.  Musculoskeletal:        General: Normal range of motion.  Skin:    General: Skin is warm and dry.  Neurological:     Mental Status: She is alert.  Psychiatric:        Mood and Affect: Mood normal.        Behavior: Behavior normal.    BP (!) 159/83 (BP Location: Left Arm, Patient Position: Sitting, Cuff Size: Large)   Pulse 65   Temp 98 F (36.7 C) (Temporal)   Ht 5' 5 (1.651 m)   Wt 263 lb 9.6 oz (119.6 kg)   SpO2 98%   BMI 43.87 kg/m  Wt Readings from Last 3 Encounters:  11/27/23 263 lb 9.6 oz (119.6 kg)   03/23/23 260 lb (117.9 kg)  11/24/22 260 lb (117.9 kg)      Lucius Krabbe, NP

## 2023-11-27 NOTE — Assessment & Plan Note (Signed)
 Patient reports difficulty losing weight, particularly during the winter months. Discussed the benefits of a balanced diet and regular exercise. Patient declined referral to a nutritionist or medical weight loss clinic today. -Encouraged patient to continue efforts to maintain a healthy diet and regular exercise. -Consider referral to a nutritionist or medical weight loss clinic if patient's circumstances change.

## 2024-03-21 ENCOUNTER — Encounter (HOSPITAL_COMMUNITY): Payer: Self-pay

## 2024-03-28 ENCOUNTER — Ambulatory Visit: Payer: Medicare Other

## 2024-04-04 ENCOUNTER — Other Ambulatory Visit: Payer: Self-pay | Admitting: Family

## 2024-04-04 DIAGNOSIS — I10 Essential (primary) hypertension: Secondary | ICD-10-CM

## 2024-05-06 ENCOUNTER — Encounter: Payer: Self-pay | Admitting: Physician Assistant

## 2024-05-20 ENCOUNTER — Other Ambulatory Visit: Payer: Self-pay

## 2024-05-20 ENCOUNTER — Other Ambulatory Visit: Payer: Self-pay | Admitting: Family

## 2024-05-20 DIAGNOSIS — I825Z9 Chronic embolism and thrombosis of unspecified deep veins of unspecified distal lower extremity: Secondary | ICD-10-CM

## 2024-05-20 DIAGNOSIS — I10 Essential (primary) hypertension: Secondary | ICD-10-CM

## 2024-05-20 MED ORDER — RIVAROXABAN 20 MG PO TABS: 20.0000 mg | ORAL_TABLET | Freq: Every day | ORAL | 1 refills | Status: AC

## 2024-05-20 MED ORDER — HYDROCHLOROTHIAZIDE 12.5 MG PO TABS: 12.5000 mg | ORAL_TABLET | Freq: Every day | ORAL | 1 refills | Status: AC

## 2024-05-20 MED ORDER — HYDROCHLOROTHIAZIDE 12.5 MG PO TABS: 12.5000 mg | ORAL_TABLET | Freq: Every day | ORAL | 0 refills | Status: DC

## 2024-05-20 MED ORDER — RIVAROXABAN 20 MG PO TABS: 20.0000 mg | ORAL_TABLET | Freq: Every day | ORAL | 1 refills | Status: DC

## 2024-05-20 NOTE — Telephone Encounter (Signed)
 Copied from CRM [id number]. Topic: Clinical - Medication Refill >> May 20, 2024 12:26 PM Abigail D wrote: Medication: hydrochlorothiazide  (HYDRODIURIL ) 12.5 MG tablet - Patient would like to request a 47mo supply rivaroxaban  (XARELTO ) 20 MG TABS tablet  Has the patient contacted their pharmacy? Yes (Agent: If no, request that the patient contact the pharmacy for the refill. If patient does not wish to contact the pharmacy document the reason why and proceed with request.) (Agent: If yes, when and what did the pharmacy advise?)  This is the patient's preferred pharmacy:  [facility] [address], [place] - [address] [place] [address] Phone: [phone] Fax: [phone]  Is this the correct pharmacy for this prescription? Yes If no, delete pharmacy and type the correct one.   Has the prescription been filled recently? Yes  Is the patient out of the medication? Yes  Has the patient been seen for an appointment in the last year OR does the patient have an upcoming appointment? Yes  Can we respond through MyChart? Yes  Agent: Please be advised that Rx refills may take up to 3 business days. We ask that you follow-up with your pharmacy.

## 2024-06-12 ENCOUNTER — Ambulatory Visit (INDEPENDENT_AMBULATORY_CARE_PROVIDER_SITE_OTHER)

## 2024-06-12 VITALS — Ht 66.0 in | Wt 263.0 lb

## 2024-06-12 DIAGNOSIS — E2839 Other primary ovarian failure: Secondary | ICD-10-CM

## 2024-06-12 DIAGNOSIS — Z1231 Encounter for screening mammogram for malignant neoplasm of breast: Secondary | ICD-10-CM

## 2024-06-12 DIAGNOSIS — Z0000 Encounter for general adult medical examination without abnormal findings: Secondary | ICD-10-CM

## 2024-06-12 NOTE — Patient Instructions (Signed)
 Michele Morrison , Thank you for taking time out of your busy schedule to complete your Annual Wellness Visit with me. I enjoyed our conversation and look forward to speaking with you again next year. I, as well as your care team,  appreciate your ongoing commitment to your health goals. Please review the following plan we discussed and let me know if I can assist you in the future. Your Game plan/ To Do List    Referrals: If you haven't heard from the office you've been referred to, please reach out to them at the phone provided.   Follow up Visits: Next Medicare AWV with our clinical staff: 06/17/25   Have you seen your provider in the last 6 months (3 months if uncontrolled diabetes)? Yes Next Office Visit with your provider: pt will make appt   Clinician Recommendations:  Aim for 30 minutes of exercise or brisk walking, 6-8 glasses of water , and 5 servings of fruits and vegetables each day.       This is a list of the screening recommended for you and due dates:  Health Maintenance  Topic Date Due   Zoster (Shingles) Vaccine (1 of 2) Never done   Mammogram  02/26/2014   DEXA scan (bone density measurement)  Never done   Medicare Annual Wellness Visit  03/22/2024   DTaP/Tdap/Td vaccine (2 - Td or Tdap) 11/26/2024*   Colon Cancer Screening  11/26/2024*   Pneumococcal Vaccine for age over 103  Completed   Hepatitis C Screening  Completed   Hepatitis B Vaccine  Aged Out   HPV Vaccine  Aged Out   Meningitis B Vaccine  Aged Out   Flu Shot  Discontinued   COVID-19 Vaccine  Discontinued  *Topic was postponed. The date shown is not the original due date.    Advanced directives: (Copy Requested) Please bring a copy of your health care power of attorney and living will to the office to be added to your chart at your convenience. You can mail to [facility] [address] [place], [place] [address] or email to [email or web address] Advance Care Planning is important because  it:  [x]  Makes sure you receive the medical care that is consistent with your values, goals, and preferences  [x]  It provides guidance to your family and loved ones and reduces their decisional burden about whether or not they are making the right decisions based on your wishes.  Follow the link provided in your after visit summary or read over the paperwork we have mailed to you to help you started getting your Advance Directives in place. If you need assistance in completing these, please reach out to us  so that we can help you!  See attachments for Preventive Care and Fall Prevention Tips.

## 2024-06-12 NOTE — Progress Notes (Signed)
 Subjective:   Michele Morrison is a 68 y.o. who presents for a Medicare Wellness preventive visit.  As a reminder, Annual Wellness Visits don't include a physical exam, and some assessments may be limited, especially if this visit is performed virtually. We may recommend an in-person follow-up visit with your provider if needed.  Visit Complete: Virtual I connected with  Michele Morrison on 06/12/24 by a audio enabled telemedicine application and verified that I am speaking with the correct person using two identifiers.  Patient Location: Home  Provider Location: Home Office  I discussed the limitations of evaluation and management by telemedicine. The patient expressed understanding and agreed to proceed.  Vital Signs: Because this visit was a virtual/telehealth visit, some criteria may be missing or patient reported. Any vitals not documented were not able to be obtained and vitals that have been documented are patient reported.  VideoDeclined- This patient declined Librarian, academic. Therefore the visit was completed with audio only.  Persons Participating in Visit: Patient.  AWV Questionnaire: No: Patient Medicare AWV questionnaire was not completed prior to this visit.  Cardiac Risk Factors include: advanced age (>63men, >63 women);hypertension;obesity (BMI >30kg/m2)     Objective:    Today's Vitals   06/12/24 0928  Weight: 263 lb (119.3 kg)  Height: 5' 6 (1.676 m)   Body mass index is 42.45 kg/m.     06/12/2024    9:34 AM 03/23/2023    1:25 PM 09/10/2019   10:48 AM 09/05/2019   11:22 AM 08/23/2019    3:58 PM 12/26/2018    8:00 AM 12/19/2018    4:19 PM  Advanced Directives  Does Patient Have a Medical Advance Directive? Yes Yes No No No No  No   Type of Estate agent of [place];Living will Healthcare Power of [place];Living will       Copy of Healthcare Power of Attorney in Chart? No - copy requested No - copy requested        Would patient like information on creating a medical advance directive?   No - Patient declined  No - Patient declined No - Patient declined  No - Patient declined      Data saved with a previous flowsheet row definition    Current Medications (verified) Outpatient Encounter Medications as of 06/12/2024  Medication Sig   hydrochlorothiazide  (HYDRODIURIL ) 12.5 MG tablet Take 1 tablet (12.5 mg total) by mouth daily. Please schedule follow up appointment.   rivaroxaban  (XARELTO ) 20 MG TABS tablet Take 1 tablet (20 mg total) by mouth daily with supper.   [DISCONTINUED] erythromycin  ophthalmic ointment Place into the left eye in the morning and at bedtime. Place a 1/2 inch ribbon of ointment into the lower eyelid. (Patient not taking: Reported on 11/27/2023)   No facility-administered encounter medications on file as of 06/12/2024.    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  She is able to walk several blocks without dyspnea or chest pain.  This is slow due to her pain.  Her blood pressure is well controlled.  Will obtain hemoglobin A1c metabolic panel today.  EKG shows some signs of left ventricular hypertrophy which is unchanged from prior.  She denies any symptoms.  She is at increased r   Preventative  health care 12/21/2015   Primary osteoarthritis of left hip 09/10/2019   Primary osteoarthritis of right knee 12/25/2018   Ulcer of ankle (HCC) 05/27/2014   Past Surgical History:  Procedure Laterality Date   COLONOSCOPY     JOINT REPLACEMENT Bilateral    ROTATOR CUFF REPAIR Right    TOTAL HIP ARTHROPLASTY Left 09/10/2019   Procedure: LEFT TOTAL HIP ARTHROPLASTY ANTERIOR APPROACH;  Surgeon: Sheril Coy, MD;  Location: [facility];  Service: Orthopedics;  Laterality: Left;   TOTAL KNEE ARTHROPLASTY Left 2012   TOTAL KNEE ARTHROPLASTY Right 12/25/2018   Procedure: TOTAL KNEE ARTHROPLASTY;  Surgeon: Sheril Coy, MD;  Location: [facility];  Service: Orthopedics;  Laterality: Right;   Family History  Problem Relation Age of Onset   Colon cancer Mother 43   Coronary artery disease Mother    Heart disease Mother    Stroke Mother    Thyroid cancer Mother    Cancer Father        Non-Hodgkin's lymphoma   Coronary artery disease Father    Heart disease Father    Stroke Father    Social History   Socioeconomic History   Marital status: Single    Spouse name: Not on file   Number of children: 0   Years of education: Not on file   Highest education level: Not on file  Occupational History   Occupation: Research scientist (life sciences)  Tobacco Use   Smoking status: Never   Smokeless tobacco: Never  Vaping Use   Vaping status: Never Used  Substance and Sexual Activity   Alcohol use: Not Currently    Alcohol/week: 0.0 standard drinks of alcohol   Drug use: Never   Sexual activity: Never    Birth control/protection: Post-menopausal    Comment: never  Other Topics Concern   Not on file  Social History Narrative   Not on file   Social Drivers of Health   Financial Resource Strain: Low Risk  (06/12/2024)   Overall Financial Resource Strain (CARDIA)    Difficulty of Paying Living Expenses: Not hard at all  Food Insecurity: No Food Insecurity (06/12/2024)   Hunger Vital Sign    Worried About Running  Out of Food in the Last Year: Never true    Ran Out of Food in the Last Year: Never true  Transportation Needs: No Transportation Needs (06/12/2024)   PRAPARE - Administrator, Civil Service (Medical): No    Lack of Transportation (Non-Medical): No  Physical Activity: Sufficiently Active (06/12/2024)   Exercise Vital Sign    Days of Exercise per Week: 6 days    Minutes of Exercise per Session: 120 min  Stress: No Stress Concern Present (06/12/2024)   [employer] of Occupational Health - Occupational Stress Questionnaire    Feeling of Stress: Not at all  Social Connections: Socially Isolated (06/12/2024)   Social Connection and Isolation Panel    Frequency of Communication with Friends and Family: More than  three times a week    Frequency of Social Gatherings with Friends and Family: More than three times a week    Attends Religious Services: Never    Database administrator or Organizations: No    Attends Engineer, structural: Never    Marital Status: Never married    Tobacco Counseling Counseling given: Not Answered    Clinical Intake:  Pre-visit preparation completed: Yes  Pain : No/denies pain     BMI - recorded: 42.45 Nutritional Status: BMI > 30  Obese Diabetes: No  Lab Results  Component Value Date   HGBA1C 5.7 (A) 09/02/2019   HGBA1C 5.7 12/10/2018     How often do you need to have someone help you when you read instructions, pamphlets, or other written materials from your doctor or pharmacy?: 1 - Never  Interpreter Needed?: No  Information entered by :: Ellouise Haws, :LPN   Activities of Daily Living      06/12/2024    9:30 AM  In your present state of health, do you have any difficulty performing the following activities:  Hearing? 1  Vision? 0  Difficulty concentrating or making decisions? 0  Walking or climbing stairs? 1  Comment slower than most  Dressing or bathing? 0  Doing errands, shopping? 0  Preparing Food and  eating ? N  Using the Toilet? N  In the past six months, have you accidently leaked urine? N  Do you have problems with loss of bowel control? N  Managing your Medications? N  Managing your Finances? N  Housekeeping or managing your Housekeeping? N    Patient Care Team: Lucius Krabbe, NP as PCP - General (Family Medicine)   I have updated your Care Teams any recent Medical Services you may have received from other providers in the past year.     Assessment:   This is a routine wellness examination for Michele Morrison.  Hearing/Vision screen Hearing Screening - Comments:: Left ear hearing loss  Vision Screening - Comments:: Was with [facility] looking for new provider    Goals Addressed             This Visit's Progress    Patient Stated       Weight loss       Depression Screen      06/12/2024    9:32 AM 11/27/2023   10:39 AM 03/23/2023    1:23 PM 10/14/2022   10:19 AM 12/19/2018    4:19 PM 12/25/2017    9:37 AM 04/25/2017    9:02 AM  PHQ 2/9 Scores  PHQ - 2 Score 0 0 0 0 0 0 0  PHQ- 9 Score  1         Fall Risk      06/12/2024    9:35 AM 03/23/2023    1:26 PM 10/14/2022   10:19 AM 12/25/2017    9:37 AM 12/21/2015    1:41 PM  Fall Risk   Falls in the past year? 0 0 0 No  No   Number falls in past yr: 0 0 0    Injury with Fall? 0 0 0    Risk for fall due to : No Fall Risks Impaired vision No Fall Risks    Follow up Falls prevention discussed Falls prevention discussed Falls evaluation completed;Education provided        Data saved with a previous flowsheet row definition    MEDICARE RISK AT HOME:   Medicare Risk at Home  Any stairs in or around the home?: Yes If so, are there any without handrails?: No Home free of loose throw rugs in walkways, pet beds, electrical cords, etc?: Yes Adequate lighting in your home to reduce risk of falls?: Yes Life alert?: No Use of a cane, walker or w/c?: No Grab bars in the bathroom?: Yes Shower chair or bench in shower?:  No Elevated toilet seat or a handicapped toilet?: No  TIMED UP AND GO:  Was the test performed?  No  Cognitive Function: 6CIT completed        06/12/2024    9:35 AM 03/23/2023    1:27 PM  6CIT Screen  What Year? 0 points 0 points  What month? 0 points 0 points  What time? 0 points 0 points  Count back from 20 0 points 0 points  Months in reverse 0 points 0 points  Repeat phrase 0 points 0 points  Total Score 0 points 0 points    Immunizations Immunization History  Administered Date(s) Administered   Fluad Quad(high Dose 65+) 10/14/2022   Influenza Whole 09/10/2008   Influenza,inj,Quad PF,6+ Mos 12/22/2016   PFIZER(Purple Top)SARS-COV-2 Vaccination 02/07/2020, 03/04/2020   PNEUMOCOCCAL CONJUGATE-20 11/24/2022   Tdap 04/04/2013    Screening Tests Health Maintenance  Topic Date Due   Zoster Vaccines- Shingrix (1 of 2) Never done   MAMMOGRAM  02/26/2014   DEXA SCAN  Never done   DTaP/Tdap/Td (2 - Td or Tdap) 11/26/2024 (Originally 04/05/2023)   Colonoscopy  11/26/2024 (Originally 08/21/2023)   Medicare Annual Wellness (AWV)  06/12/2025   Pneumococcal Vaccine: 50+ Years  Completed   Hepatitis C Screening  Completed   Hepatitis B Vaccines  Aged Out   HPV VACCINES  Aged Out   Meningococcal B Vaccine  Aged Out   INFLUENZA VACCINE  Discontinued   COVID-19 Vaccine  Discontinued    Health Maintenance  Health Maintenance Due  Topic Date Due   Zoster Vaccines- Shingrix (1 of 2) Never done   MAMMOGRAM  02/26/2014   DEXA SCAN  Never done   Health Maintenance Items Addressed: See Nurse Notes at the end of this note  Additional Screening:  Vision Screening: Recommended annual ophthalmology exams for early detection of glaucoma and other disorders of the eye. Would you like a referral to an eye doctor? No    Dental Screening: Recommended annual dental exams for proper oral hygiene  Community Resource Referral / Chronic Care Management: CRR required this visit?  No    CCM required this visit?  No   Plan:    I have personally reviewed and noted the following in the patient's chart:   Medical and social history Use of alcohol, tobacco or illicit drugs  Current medications and supplements including opioid prescriptions. Patient is not currently taking opioid prescriptions. Functional ability and status Nutritional status Physical activity Advanced directives List of other physicians Hospitalizations, surgeries, and ER visits in previous 12 months Vitals Screenings to include cognitive, depression, and falls Referrals and appointments  In addition, I have reviewed and discussed with patient certain preventive protocols, quality metrics, and best practice recommendations. A written personalized care plan for preventive services as well as general preventive health recommendations were provided to patient.   Ellouise VEAR Haws, LPN   2/69/7974   After Visit Summary: (MyChart) Due to this being a telephonic visit, the after visit summary with patients personalized plan was offered to patient via MyChart   Notes: Nothing significant to report at this time.

## 2024-07-01 NOTE — Progress Notes (Unsigned)
 Ellouise Console, PA-C [address] [place], [place]  [address] Phone: [phone]   Gastroenterology Consultation  Referring Provider:     Lucius Krabbe, NP Primary Care Physician:  Lucius Krabbe, NP Primary Gastroenterologist:  Ellouise Console, PA-C / Dr. Gordy Starch  Reason for Consultation:     History of colon polyps, discuss repeat colonoscopy.  On Xarelto .        HPI:   Michele Morrison is a 68 y.o. y/o female referred for consultation & management  by Lucius Krabbe, NP.  She is here to discuss repeat colonoscopy.  She has personal history of sessile serrated colon polyps.  Also has family history of mother who had colon cancer age 4.  Current symptoms: She has mild episode of constipation once per month.  Resolves with OTC laxative.  Otherwise normal bowel movement daily.  Rare episode of acid reflux which is relieved with OTC antacid.  No other GI symptoms.  08/2018 colonoscopy by Dr. Starch: 4 (5 mm to 6 mm) sessile serrated and hyperplastic polyps removed.  No dysplasia.  Diverticulosis.  Good prep.  5-year repeat (Was due 08/2023).  01/2015 colonoscopy by Dr. Starch: 5 (5 mm to 9 mm) sessile serrated polyps removed.  3-year repeat.  PMH: Morbid obesity, DVT (in 1996), hypertension, osteoarthritis, currently on Xarelto .  No recent hospitalizations or changes in her health this year.  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  She is able to walk several blocks without dyspnea or chest pain.  This is slow due to her pain.  Her blood pressure is well controlled.  Will obtain hemoglobin A1c metabolic panel today.  EKG shows some signs of left ventricular hypertrophy which is unchanged from prior.  She denies any symptoms.  She is at increased r   Preventative health care 12/21/2015   Primary osteoarthritis of left hip 09/10/2019   Primary osteoarthritis of right knee 12/25/2018   Ulcer of ankle (HCC) 05/27/2014    Past Surgical History:  Procedure Laterality Date   COLONOSCOPY     JOINT REPLACEMENT Bilateral    ROTATOR CUFF REPAIR Right    TOTAL HIP ARTHROPLASTY Left 09/10/2019   Procedure: LEFT TOTAL HIP ARTHROPLASTY ANTERIOR APPROACH;  Surgeon: Sheril Coy, MD;  Location: [facility];  Service: Orthopedics;  Laterality: Left;   TOTAL KNEE ARTHROPLASTY Left 2012   TOTAL KNEE ARTHROPLASTY Right 12/25/2018   Procedure: TOTAL KNEE ARTHROPLASTY;  Surgeon: Sheril Coy, MD;  Location: [facility];  Service: Orthopedics;  Laterality: Right;    Prior to Admission medications   Medication Sig Start Date End Date Taking? Authorizing Provider  hydrochlorothiazide  (HYDRODIURIL ) 12.5 MG tablet Take 1 tablet (12.5 mg total) by mouth daily. Please schedule follow up appointment. 05/20/24   Lucius Krabbe, NP  rivaroxaban  (XARELTO ) 20 MG TABS tablet Take  1 tablet (20 mg total) by mouth daily with supper. 05/20/24   Lucius Krabbe, NP    Family History  Problem Relation Age of Onset   Colon cancer Mother 38   Coronary artery disease Mother    Heart disease Mother    Stroke Mother    Thyroid cancer Mother    Cancer Father        Non-Hodgkin's lymphoma   Coronary artery disease Father    Heart disease Father    Stroke Father      Social History   Tobacco Use   Smoking status: Never   Smokeless tobacco: Never  Vaping Use   Vaping status: Never Used  Substance Use Topics    Alcohol use: Not Currently    Alcohol/week: 0.0 standard drinks of alcohol   Drug use: Never    Allergies as of 07/02/2024 - Review Complete 07/02/2024  Allergen Reaction Noted   Latex Itching, Rash, and Other (See Comments) 09/02/2019   Cephalexin Rash 03/22/2006   Vancomycin  Rash 12/25/2018   Neosporin [bacitracin-polymyxin b]  09/02/2019   Other  09/10/2019   Penicillins Other (See Comments) 04/06/2013    Review of Systems:    All systems reviewed and negative except where noted in HPI.   Physical Exam:  BP (!) 140/88   Pulse 69   Ht 5' 5.5 (1.664 m)   Wt 252 lb 2 oz (114.4 kg)   BMI 41.32 kg/m  No LMP recorded. Patient is postmenopausal.  General:   Alert,  Well-developed, obese, pleasant and cooperative in NAD Lungs:  Respirations even and unlabored.  Clear throughout to auscultation.   No wheezes, crackles, or rhonchi. No acute distress. Heart:  Regular rate and rhythm; no murmurs, clicks, rubs, or gallops. Abdomen:  Normal bowel sounds.  No bruits.  Soft, and non-distended without masses, hepatosplenomegaly or hernias noted.  No Tenderness.  No guarding or rebound tenderness.    Neurologic:  Alert and oriented x3;  grossly normal neurologically. Psych:  Alert and cooperative. Normal mood and affect.  Imaging Studies: No results found.  Labs: CBC    Component Value Date/Time   WBC 5.5 10/02/2022 1149   RBC 4.71 10/02/2022 1149   HGB 14.9 10/02/2022 1149   HGB 14.6 09/02/2019 0930   HCT 44.7 10/02/2022 1149   HCT 43.8 09/02/2019 0930   PLT 201 10/02/2022 1149   PLT 235 09/02/2019 0930   MCV 94.9 10/02/2022 1149   MCV 90 09/02/2019 0930    CMP     Component Value Date/Time   NA 138 11/27/2023 1047   NA 141 09/02/2019 0930   K 3.8 11/27/2023 1047   CL 101 11/27/2023 1047   CO2 30 11/27/2023 1047   GLUCOSE 111 (H) 11/27/2023 1047   BUN 17 11/27/2023 1047   BUN 15 09/02/2019 0930   CREATININE 0.73 11/27/2023 1047   CREATININE 0.66 12/08/2014 1426    CALCIUM 10.2 11/27/2023 1047   PROT 7.4 11/27/2023 1047   ALBUMIN  4.1 11/27/2023 1047   AST 26 11/27/2023 1047   ALT 29 11/27/2023 1047   ALKPHOS 114 11/27/2023 1047   BILITOT 0.6 11/27/2023 1047   GFRNONAA >60 10/02/2022 1149   GFRAA >60 09/11/2019 0517    Assessment and Plan:   Michele Morrison is a 68 y.o. y/o female has been referred for:   1.  History of colon polyps - Scheduling Colonoscopy I discussed risks of colonoscopy with patient to include risk of bleeding, colon perforation, and risk  of sedation.  Patient expressed understanding and agrees to proceed with colonoscopy.   2.  Family history of colon cancer mother age 15.  2.  History of DVT (in 1996) on Xarelto  lifelong anticoagulation - Requesting permission from PCP to hold Xarelto  2 days before Colonoscopy.  Follow up As Needed.  Ellouise Console, PA-C

## 2024-07-02 ENCOUNTER — Telehealth: Payer: Self-pay

## 2024-07-02 ENCOUNTER — Encounter: Payer: Self-pay | Admitting: Physician Assistant

## 2024-07-02 ENCOUNTER — Ambulatory Visit: Admitting: Physician Assistant

## 2024-07-02 VITALS — BP 140/88 | HR 69 | Ht 65.5 in | Wt 252.1 lb

## 2024-07-02 DIAGNOSIS — Z7901 Long term (current) use of anticoagulants: Secondary | ICD-10-CM

## 2024-07-02 DIAGNOSIS — Z86718 Personal history of other venous thrombosis and embolism: Secondary | ICD-10-CM | POA: Diagnosis not present

## 2024-07-02 DIAGNOSIS — Z860100 Personal history of colon polyps, unspecified: Secondary | ICD-10-CM | POA: Diagnosis not present

## 2024-07-02 DIAGNOSIS — Z01818 Encounter for other preprocedural examination: Secondary | ICD-10-CM | POA: Diagnosis not present

## 2024-07-02 DIAGNOSIS — Z800 Family history of malignant neoplasm of digestive organs: Secondary | ICD-10-CM

## 2024-07-02 MED ORDER — NA SULFATE-K SULFATE-MG SULF 17.5-3.13-1.6 GM/177ML PO SOLN: 1.0000 | Freq: Once | ORAL | 0 refills | Status: AC

## 2024-07-02 NOTE — Telephone Encounter (Signed)
 yes ok to hold Xarelto  for 2 days, with reminder to restart as soon as allowed after procedure. Thx

## 2024-07-02 NOTE — Telephone Encounter (Signed)
  Michele Morrison 09-03-1956 [id number]  07/02/24   Dear  Lucius Krabbe, NP:  We have scheduled the above named patient for a(n) Colonoscopy procedure. Our records show that (s)he is on anticoagulation therapy.  Please advise as to whether the patient may come off their therapy of Xarelto  2 days prior to their procedure which is scheduled for 08/29/24.  Please route your response to Alethea Blocker, CMA or fax response to [phone].  Sincerely,    [facility]

## 2024-07-02 NOTE — Patient Instructions (Signed)
 You have been scheduled for a Colonoscopy. Please follow written instructions given to you at your visit today.   If you use inhalers (even only as needed), please bring them with you on the day of your procedure.  DO NOT TAKE 7 DAYS PRIOR TO TEST- Trulicity (dulaglutide) Ozempic, Wegovy (semaglutide) Mounjaro (tirzepatide) Bydureon Bcise (exanatide extended release)  DO NOT TAKE 1 DAY PRIOR TO YOUR TEST Rybelsus (semaglutide) Adlyxin (lixisenatide) Victoza (liraglutide) Byetta (exanatide) ___________________________________________________________________________  Please follow up sooner if symptoms increase or worsen   Due to recent changes in healthcare laws, you may see the results of your imaging and laboratory studies on MyChart before your provider has had a chance to review them.  We understand that in some cases there may be results that are confusing or concerning to you. Not all laboratory results come back in the same time frame and the provider may be waiting for multiple results in order to interpret others.  Please give us  48 hours in order for your provider to thoroughly review all the results before contacting the office for clarification of your results.   Thank you for trusting me with your gastrointestinal care!   Ellouise Console, PA-C _______________________________________________________  If your blood pressure at your visit was 140/90 or greater, please contact your primary care physician to follow up on this.  _______________________________________________________  If you are age 26 or older, your body mass index should be between 23-30. Your Body mass index is 41.32 kg/m. If this is out of the aforementioned range listed, please consider follow up with your Primary Care Provider.  If you are age 29 or younger, your body mass index should be between 19-25. Your Body mass index is 41.32 kg/m. If this is out of the aformentioned range listed, please consider  follow up with your Primary Care Provider.   ________________________________________________________  The [facility] providers would like to encourage you to use MYCHART to communicate with providers for non-urgent requests or questions.  Due to long hold times on the telephone, sending your provider a message by [facility] may be a faster and more efficient way to get a response.  Please allow 48 business hours for a response.  Please remember that this is for non-urgent requests.  _______________________________________________________

## 2024-07-08 ENCOUNTER — Encounter (HOSPITAL_BASED_OUTPATIENT_CLINIC_OR_DEPARTMENT_OTHER): Payer: Self-pay

## 2024-08-16 DIAGNOSIS — I781 Nevus, non-neoplastic: Secondary | ICD-10-CM | POA: Diagnosis not present

## 2024-08-16 DIAGNOSIS — L603 Nail dystrophy: Secondary | ICD-10-CM | POA: Diagnosis not present

## 2024-08-16 DIAGNOSIS — B078 Other viral warts: Secondary | ICD-10-CM | POA: Diagnosis not present

## 2024-08-21 ENCOUNTER — Encounter: Payer: Self-pay | Admitting: Internal Medicine

## 2024-08-29 ENCOUNTER — Encounter: Payer: Self-pay | Admitting: Internal Medicine

## 2024-08-29 ENCOUNTER — Ambulatory Visit: Admitting: Internal Medicine

## 2024-08-29 VITALS — BP 140/65 | HR 63 | Temp 98.2°F | Resp 11 | Ht 65.5 in | Wt 252.0 lb

## 2024-08-29 DIAGNOSIS — K635 Polyp of colon: Secondary | ICD-10-CM | POA: Diagnosis not present

## 2024-08-29 DIAGNOSIS — Z1211 Encounter for screening for malignant neoplasm of colon: Secondary | ICD-10-CM | POA: Diagnosis not present

## 2024-08-29 DIAGNOSIS — Z860101 Personal history of adenomatous and serrated colon polyps: Secondary | ICD-10-CM

## 2024-08-29 DIAGNOSIS — I10 Essential (primary) hypertension: Secondary | ICD-10-CM | POA: Diagnosis not present

## 2024-08-29 DIAGNOSIS — K5730 Diverticulosis of large intestine without perforation or abscess without bleeding: Secondary | ICD-10-CM | POA: Diagnosis not present

## 2024-08-29 DIAGNOSIS — D123 Benign neoplasm of transverse colon: Secondary | ICD-10-CM | POA: Diagnosis not present

## 2024-08-29 DIAGNOSIS — Z7901 Long term (current) use of anticoagulants: Secondary | ICD-10-CM | POA: Diagnosis not present

## 2024-08-29 DIAGNOSIS — Z800 Family history of malignant neoplasm of digestive organs: Secondary | ICD-10-CM | POA: Diagnosis not present

## 2024-08-29 DIAGNOSIS — D125 Benign neoplasm of sigmoid colon: Secondary | ICD-10-CM

## 2024-08-29 DIAGNOSIS — R7303 Prediabetes: Secondary | ICD-10-CM | POA: Diagnosis not present

## 2024-08-29 DIAGNOSIS — Z860100 Personal history of colon polyps, unspecified: Secondary | ICD-10-CM

## 2024-08-29 MED ORDER — SODIUM CHLORIDE 0.9 % IV SOLN: 500.0000 mL | Freq: Once | INTRAVENOUS | Status: DC

## 2024-08-29 NOTE — Op Note (Signed)
 [facility] Patient Name: Michele Morrison Procedure Date: 08/29/2024 2:23 PM MRN: [id number] Endoscopist: Gordy CHRISTELLA Starch , MD, [id number] Age: 69 Referring MD:  Date of Birth: [birth year] Gender: Female Account #: [id number] Procedure:                Colonoscopy Indications:              High risk colon cancer surveillance: Personal                            history of sessile serrated colon polyps with no                            dysplasia, Family history of colon cancer in a                            first-degree relative (mother), Last colonoscopy:                            October 2019 (SSP x 1), March 2016 (SSP x 4) Medicines:                Monitored Anesthesia Care Procedure:                Pre-Anesthesia Assessment:                           - Prior to the procedure, a History and Physical                            was performed, and patient medications and                            allergies were reviewed. The patient's tolerance of                            previous anesthesia was also reviewed. The risks                            and benefits of the procedure and the sedation                            options and risks were discussed with the patient.                            All questions were answered, and informed consent                            was obtained. Prior Anticoagulants: The patient has                            taken Xarelto  (rivaroxaban ), last dose was 2 days                            prior to procedure. ASA Grade Assessment: III - A  patient with severe systemic disease. After                            reviewing the risks and benefits, the patient was                            deemed in satisfactory condition to undergo the                            procedure.                           After obtaining informed consent, the colonoscope                            was passed under direct vision. Throughout the                             procedure, the patient's blood pressure, pulse, and                            oxygen saturations were monitored continuously. The                            Olympus Scope SN: [id number] was introduced through                            the anus and advanced to the cecum, identified by                            appendiceal orifice and ileocecal valve. The                            colonoscopy was performed without difficulty. The                            patient tolerated the procedure well. The quality                            of the bowel preparation was good. The ileocecal                            valve, appendiceal orifice, and rectum were                            photographed. Scope In: 3:14:35 PM Scope Out: 3:27:54 PM Scope Withdrawal Time: 0 hours 10 minutes 46 seconds  Total Procedure Duration: 0 hours 13 minutes 19 seconds  Findings:                 The digital rectal exam was normal.                           A 7 mm polyp was found in the transverse colon. The  polyp was sessile. The polyp was removed with a                            cold snare. Resection and retrieval were complete.                           A 3 mm polyp was found in the sigmoid colon. The                            polyp was sessile. The polyp was removed with a                            cold snare. Resection and retrieval were complete.                           Many large-mouthed, medium-mouthed and                            small-mouthed diverticula were found in the sigmoid                            colon and descending colon.                           The retroflexed view of the distal rectum and anal                            verge was normal and showed no anal or rectal                            abnormalities. Complications:            No immediate complications. Estimated Blood Loss:     Estimated blood loss was minimal. Impression:                - One 7 mm polyp in the transverse colon, removed                            with a cold snare. Resected and retrieved.                           - One 3 mm polyp in the sigmoid colon, removed with                            a cold snare. Resected and retrieved.                           - Severe diverticulosis in the sigmoid colon and in                            the descending colon.                           - The distal rectum and anal verge are  normal on                            retroflexion view. Recommendation:           - Patient has a contact number available for                            emergencies. The signs and symptoms of potential                            delayed complications were discussed with the                            patient. Return to normal activities tomorrow.                            Written discharge instructions were provided to the                            patient.                           - Resume previous diet.                           - Continue present medications.                           - Resume Xarelto  (rivaroxaban ) at prior dose                            tomorrow. Refer to managing physician for further                            adjustment of therapy.                           - Await pathology results.                           - Repeat colonoscopy is recommended for                            surveillance. The colonoscopy date will be                            determined after pathology results from today's                            exam become available for review. Gordy CHRISTELLA Starch, MD 08/29/2024 3:31:10 PM This report has been signed electronically.

## 2024-08-29 NOTE — Patient Instructions (Signed)
 Handouts provided on polyps and diverticulosis.  Resume previous diet.  Continue present medications.  Resume Xarelto  (rivaroxaban ) at prior dose tomorrow.  Await pathology results.  Repeat colonoscopy is recommended for surveillance. The colonoscopy date will be determined after pathology results from today's exam become available for review.   YOU HAD AN ENDOSCOPIC PROCEDURE TODAY AT THE [facility]:   Refer to the procedure report that was given to you for any specific questions about what was found during the examination.  If the procedure report does not answer your questions, please call your gastroenterologist to clarify.  If you requested that your care partner not be given the details of your procedure findings, then the procedure report has been included in a sealed envelope for you to review at your convenience later.  YOU SHOULD EXPECT: Some feelings of bloating in the abdomen. Passage of more gas than usual.  Walking can help get rid of the air that was put into your GI tract during the procedure and reduce the bloating. If you had a lower endoscopy (such as a colonoscopy or flexible sigmoidoscopy) you may notice spotting of blood in your stool or on the toilet paper. If you underwent a bowel prep for your procedure, you may not have a normal bowel movement for a few days.  Please Note:  You might notice some irritation and congestion in your nose or some drainage.  This is from the oxygen used during your procedure.  There is no need for concern and it should clear up in a day or so.  SYMPTOMS TO REPORT IMMEDIATELY:  Following lower endoscopy (colonoscopy or flexible sigmoidoscopy):  Excessive amounts of blood in the stool  Significant tenderness or worsening of abdominal pains  Swelling of the abdomen that is new, acute  Fever of 100F or higher  For urgent or emergent issues, a gastroenterologist can be reached at any hour by calling [phone]. Do not use  MyChart messaging for urgent concerns.    DIET:  We do recommend a small meal at first, but then you may proceed to your regular diet.  Drink plenty of fluids but you should avoid alcoholic beverages for 24 hours.  ACTIVITY:  You should plan to take it easy for the rest of today and you should NOT DRIVE or use heavy machinery until tomorrow (because of the sedation medicines used during the test).    FOLLOW UP: Our staff will call the number listed on your records the next business day following your procedure.  We will call around 7:15- 8:00 am to check on you and address any questions or concerns that you may have regarding the information given to you following your procedure. If we do not reach you, we will leave a message.     If any biopsies were taken you will be contacted by phone or by letter within the next 1-3 weeks.  Please call us  at [phone] if you have not heard about the biopsies in 3 weeks.    SIGNATURES/CONFIDENTIALITY: You and/or your care partner have signed paperwork which will be entered into your electronic medical record.  These signatures attest to the fact that that the information above on your After Visit Summary has been reviewed and is understood.  Full responsibility of the confidentiality of this discharge information lies with you and/or your care-partner.

## 2024-08-29 NOTE — Progress Notes (Signed)
 Updated medical record with Michele Morrison  Upon putting leads on Michele Morrison chest, noted that Michele Morrison has area of redness to right neck approximately 6 inches by 8 inches and extends to upper chest area, Michele Morrison states had a bug bite may have been spider bite and has been putting rubbing alcohol on it ,  Informed Oneil Riff, CRNA, Michele Morrison states she has not gone to urgent care or PCP, instruct Michele Morrison to have any kind of bites or rashes checked out and not just treat at home as they could be something unusual or different than what she thinks they are, Michele Morrison verb understanding

## 2024-08-29 NOTE — Progress Notes (Signed)
 Vss nad trans to pacu

## 2024-08-29 NOTE — Progress Notes (Signed)
 Called to room to assist during endoscopic procedure.  Patient ID and intended procedure confirmed with present staff. Received instructions for my participation in the procedure from the performing physician.

## 2024-08-29 NOTE — Progress Notes (Signed)
 GASTROENTEROLOGY PROCEDURE H&P NOTE   Primary Care Physician: Lucius Krabbe, NP    Reason for Procedure:  History of colon polyps and family history of colon cancer  Plan:    Colonoscopy  Patient is appropriate for endoscopic procedure(s) in the ambulatory ([facility]) setting.  The nature of the procedure, as well as the risks, benefits, and alternatives were carefully and thoroughly reviewed with the patient. Ample time for discussion and questions allowed. The patient understood, was satisfied, and agreed to proceed.     HPI: Michele Morrison is a 68 y.o. female who presents for surveillance colonoscopy.  Medical history as below.  Tolerated the prep.  No recent chest pain or shortness of breath.  No abdominal pain today.  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  She is able to walk several blocks without dyspnea or chest pain.  This is slow due to her pain.  Her blood pressure is well controlled.  Will obtain hemoglobin A1c metabolic panel today.  EKG shows some signs of left ventricular hypertrophy which is unchanged from prior.  She denies any symptoms.  She is at increased r   Preventative health care 12/21/2015   Primary osteoarthritis of left hip 09/10/2019   Primary osteoarthritis of right knee 12/25/2018   Ulcer of ankle (HCC) 05/27/2014    Past Surgical History:  Procedure  Laterality Date   COLONOSCOPY  08/20/2018   JOINT REPLACEMENT Bilateral    ROTATOR CUFF REPAIR Right    TOTAL HIP ARTHROPLASTY Left 09/10/2019   Procedure: LEFT TOTAL HIP ARTHROPLASTY ANTERIOR APPROACH;  Surgeon: Sheril Coy, MD;  Location: [facility];  Service: Orthopedics;  Laterality: Left;   TOTAL KNEE ARTHROPLASTY Left 2012   TOTAL KNEE ARTHROPLASTY Right 12/25/2018   Procedure: TOTAL KNEE ARTHROPLASTY;  Surgeon: Sheril Coy, MD;  Location: [facility] OR;  Service: Orthopedics;  Laterality: Right;    Prior to Admission medications   Medication Sig Start Date End Date Taking? Authorizing Provider  hydrochlorothiazide  (HYDRODIURIL ) 12.5 MG tablet Take 1 tablet (12.5 mg total) by mouth daily. Please schedule follow up appointment. 05/20/24  Yes Lucius Krabbe, NP  Na Sulfate-K Sulfate-Mg Sulfate concentrate (SUPREP) 17.5-3.13-1.6 GM/177ML SOLN SMARTSIG:1 Kit(s) By Mouth Once 07/02/24  Yes [provider]  rivaroxaban  (XARELTO ) 20 MG TABS tablet Take 1 tablet (20 mg total) by mouth daily with supper. 05/20/24   Lucius Krabbe, NP    Current Outpatient Medications  Medication Sig Dispense Refill   hydrochlorothiazide  (HYDRODIURIL ) 12.5 MG tablet Take 1 tablet (12.5 mg total) by mouth daily. Please schedule follow up appointment. 90 tablet 1   Na Sulfate-K Sulfate-Mg Sulfate concentrate (SUPREP) 17.5-3.13-1.6 GM/177ML SOLN SMARTSIG:1 Kit(s) By Mouth Once     rivaroxaban  (XARELTO ) 20 MG TABS tablet Take 1 tablet (20 mg total) by mouth daily with  supper. 90 tablet 1   Current Facility-Administered Medications  Medication Dose Route Frequency Provider Last Rate Last Admin   0.9 %  sodium chloride  infusion  500 mL Intravenous Once Gregroy Dombkowski, Gordy HERO, MD        Allergies as of 08/29/2024 - Review Complete 08/29/2024  Allergen Reaction Noted   Latex Itching, Rash, and Other (See Comments) 09/02/2019   Cephalexin Rash 03/22/2006   Vancomycin  Rash 12/25/2018   Neosporin  [bacitracin-polymyxin b] Other (See Comments) 09/02/2019   Other Other (See Comments) 09/10/2019   Penicillins Other (See Comments) 04/06/2013    Family History  Problem Relation Age of Onset   Colon polyps Mother    Colon cancer Mother 64   Coronary artery disease Mother    Heart disease Mother    Stroke Mother    Thyroid cancer Mother    Cancer Father        Non-Hodgkin's lymphoma   Coronary artery disease Father    Heart disease Father    Stroke Father    Esophageal cancer Neg Hx    Rectal cancer Neg Hx    Stomach cancer Neg Hx     Social History   Socioeconomic History   Marital status: Single    Spouse name: Not on file   Number of children: 0   Years of education: Not on file   Highest education level: Not on file  Occupational History   Occupation: Research scientist (life sciences)  Tobacco Use   Smoking status: Never   Smokeless tobacco: Never  Vaping Use   Vaping status: Never Used  Substance and Sexual Activity   Alcohol use: Not Currently    Alcohol/week: 0.0 standard drinks of alcohol   Drug use: Never   Sexual activity: Never    Birth control/protection: Post-menopausal    Comment: never  Other Topics Concern   Not on file  Social History Narrative   Not on file   Social Drivers of Health   Financial Resource Strain: Low Risk  (06/12/2024)   Overall Financial Resource Strain (CARDIA)    Difficulty of Paying Living Expenses: Not hard at all  Food Insecurity: No Food Insecurity (06/12/2024)   Hunger Vital Sign    Worried About Running Out of Food in the Last Year: Never true    Ran Out of Food in the Last Year: Never true  Transportation Needs: No Transportation Needs (06/12/2024)   PRAPARE - Administrator, Civil Service (Medical): No    Lack of Transportation (Non-Medical): No  Physical Activity: Sufficiently Active (06/12/2024)   Exercise Vital Sign    Days of Exercise per Week: 6 days    Minutes of Exercise per Session: 120 min  Stress: No Stress  Concern Present (06/12/2024)   [employer] of Occupational Health - Occupational Stress Questionnaire    Feeling of Stress: Not at all  Social Connections: Socially Isolated (06/12/2024)   Social Connection and Isolation Panel    Frequency of Communication with Friends and Family: More than three times a week    Frequency of Social Gatherings with Friends and Family: More than three times a week    Attends Religious Services: Never    Database administrator or Organizations: No    Attends Banker Meetings: Never    Marital Status: Never married  Intimate Partner Violence: Not At Risk (06/12/2024)   Humiliation, Afraid, Rape, and Kick questionnaire    Fear of Current or Ex-Partner: No    Emotionally  Abused: No    Physically Abused: No    Sexually Abused: No    Physical Exam: Vital signs in last 24 hours: @BP  136/72   Pulse 72   Temp 98.2 F (36.8 C) (Temporal)   Ht 5' 5.5 (1.664 m)   Wt 252 lb (114.3 kg)   SpO2 94%   BMI 41.30 kg/m  GEN: NAD EYE: Sclerae anicteric ENT: MMM CV: Non-tachycardic Pulm: CTA b/l GI: Soft, NT/ND NEURO:  Alert & Oriented x 3   Gordy Starch, MD [facility]  08/29/2024 3:02 PM

## 2024-08-30 ENCOUNTER — Telehealth: Payer: Self-pay

## 2024-08-30 NOTE — Telephone Encounter (Signed)
  Follow up Call-     08/29/2024    2:17 PM  Call back number  Post procedure Call Back phone  # [phone]  Permission to leave phone message Yes     Patient questions:  Do you have a fever, pain , or abdominal swelling? No. Pain Score  0 *  Have you tolerated food without any problems? Yes.    Have you been able to return to your normal activities? Yes.    Do you have any questions about your discharge instructions: Diet   No. Medications  No. Follow up visit  No.  Do you have questions or concerns about your Care? No.  Actions: * If pain score is 4 or above: No action needed, pain <4.

## 2024-09-03 LAB — SURGICAL PATHOLOGY

## 2024-09-05 ENCOUNTER — Ambulatory Visit: Payer: Self-pay | Admitting: Internal Medicine

## 2024-09-10 ENCOUNTER — Encounter (HOSPITAL_BASED_OUTPATIENT_CLINIC_OR_DEPARTMENT_OTHER): Payer: Self-pay | Admitting: Radiology

## 2024-09-10 ENCOUNTER — Inpatient Hospital Stay (HOSPITAL_BASED_OUTPATIENT_CLINIC_OR_DEPARTMENT_OTHER): Admission: RE | Admit: 2024-09-10 | Source: Ambulatory Visit | Admitting: Radiology

## 2024-09-10 ENCOUNTER — Ambulatory Visit (INDEPENDENT_AMBULATORY_CARE_PROVIDER_SITE_OTHER)
Admission: RE | Admit: 2024-09-10 | Discharge: 2024-09-10 | Disposition: A | Discharge status: Home / Self Care | Source: Ambulatory Visit | Attending: Family | Admitting: Family

## 2024-09-10 DIAGNOSIS — E2839 Other primary ovarian failure: Secondary | ICD-10-CM

## 2024-09-10 DIAGNOSIS — Z1231 Encounter for screening mammogram for malignant neoplasm of breast: Secondary | ICD-10-CM | POA: Diagnosis not present

## 2024-09-15 ENCOUNTER — Ambulatory Visit: Payer: Self-pay | Admitting: Family

## 2025-06-17 ENCOUNTER — Ambulatory Visit
